# Patient Record
Sex: Male | Born: 1955
Health system: Southern US, Community
[De-identification: ages and names within clinical notes are randomized; demographics above are authoritative.]

## PROBLEM LIST (undated history)

## (undated) DIAGNOSIS — C439 Malignant melanoma of skin, unspecified: Secondary | ICD-10-CM

## (undated) DIAGNOSIS — S8992XA Unspecified injury of left lower leg, initial encounter: Secondary | ICD-10-CM

## (undated) DIAGNOSIS — I509 Heart failure, unspecified: Secondary | ICD-10-CM

## (undated) DIAGNOSIS — I10 Essential (primary) hypertension: Secondary | ICD-10-CM

## (undated) DIAGNOSIS — K219 Gastro-esophageal reflux disease without esophagitis: Secondary | ICD-10-CM

## (undated) HISTORY — DX: Malignant melanoma of skin, unspecified: C43.9

## (undated) HISTORY — PX: COLONOSCOPY: SHX174

## (undated) HISTORY — DX: Essential (primary) hypertension: I10

## (undated) HISTORY — DX: Gastro-esophageal reflux disease without esophagitis: K21.9

## (undated) HISTORY — PX: TOTAL HIP ARTHROPLASTY: SHX124

## (undated) HISTORY — PX: FOOT FRACTURE SURGERY: SHX645

## (undated) HISTORY — PX: KNEE SURGERY: SHX244

---

## 2012-05-25 ENCOUNTER — Emergency Department (HOSPITAL_BASED_OUTPATIENT_CLINIC_OR_DEPARTMENT_OTHER): Payer: BC Managed Care – PPO

## 2012-05-25 ENCOUNTER — Emergency Department (HOSPITAL_BASED_OUTPATIENT_CLINIC_OR_DEPARTMENT_OTHER)
Admission: EM | Admit: 2012-05-25 | Discharge: 2012-05-25 | Disposition: A | Discharge status: Home / Self Care | Payer: BC Managed Care – PPO | Attending: Emergency Medicine | Admitting: Emergency Medicine

## 2012-05-25 ENCOUNTER — Encounter (HOSPITAL_BASED_OUTPATIENT_CLINIC_OR_DEPARTMENT_OTHER): Payer: Self-pay

## 2012-05-25 DIAGNOSIS — Y9301 Activity, walking, marching and hiking: Secondary | ICD-10-CM | POA: Insufficient documentation

## 2012-05-25 DIAGNOSIS — Y9289 Other specified places as the place of occurrence of the external cause: Secondary | ICD-10-CM | POA: Insufficient documentation

## 2012-05-25 DIAGNOSIS — Z87828 Personal history of other (healed) physical injury and trauma: Secondary | ICD-10-CM | POA: Insufficient documentation

## 2012-05-25 DIAGNOSIS — S76111A Strain of right quadriceps muscle, fascia and tendon, initial encounter: Secondary | ICD-10-CM

## 2012-05-25 DIAGNOSIS — S838X9A Sprain of other specified parts of unspecified knee, initial encounter: Secondary | ICD-10-CM | POA: Insufficient documentation

## 2012-05-25 DIAGNOSIS — S86819A Strain of other muscle(s) and tendon(s) at lower leg level, unspecified leg, initial encounter: Secondary | ICD-10-CM | POA: Insufficient documentation

## 2012-05-25 DIAGNOSIS — X500XXA Overexertion from strenuous movement or load, initial encounter: Secondary | ICD-10-CM | POA: Insufficient documentation

## 2012-05-25 HISTORY — DX: Unspecified injury of left lower leg, initial encounter: S89.92XA

## 2012-05-25 MED ORDER — OXYCODONE-ACETAMINOPHEN 5-325 MG PO TABS: 2.0000 | ORAL_TABLET | ORAL | Status: DC | PRN

## 2012-05-25 NOTE — ED Provider Notes (Signed)
History     CSN: [id number]  Arrival date & time 05/25/12  1753   First MD Initiated Contact with Patient 05/25/12 1937      Chief Complaint  Patient presents with  . Knee Injury    (Consider location/radiation/quality/duration/timing/severity/associated sxs/prior treatment) Patient is a 57 y.o. male presenting with knee pain. The history is provided by the patient. No language interpreter was used.  Knee Pain Location:  Knee Time since incident:  3 hours Injury: yes   Mechanism of injury comment:  Walking down stairs and hyperextended Knee location:  R knee Pain details:    Quality:  Aching   Radiates to:  Does not radiate   Timing:  Constant   Progression:  Worsening Chronicity:  New Prior injury to area:  No Worsened by:  Nothing tried Ineffective treatments:  None tried  Pt reports he hyperextended his knee.  Pt complains of swelling and pain.  Past Medical History  Diagnosis Date  . Left knee injury     Past Surgical History  Procedure Laterality Date  . Total hip arthroplasty Right   . Foot fracture surgery Right     History reviewed. No pertinent family history.  History  Substance Use Topics  . Smoking status: Never Smoker   . Smokeless tobacco: Never Used  . Alcohol Use: No      Review of Systems  Musculoskeletal: Positive for myalgias and joint swelling.  All other systems reviewed and are negative.    Allergies  Morphine and related  Home Medications   Current Outpatient Rx  Name  Route  Sig  Dispense  Refill  . acetaminophen (TYLENOL) 500 MG tablet   Oral   Take 1,000 mg by mouth every 6 (six) hours as needed for pain.           BP 134/77  Pulse 87  Temp(Src) 98.4 F (36.9 C) (Oral)  Resp 20  Ht 5\' 9"  (1.753 m)  Wt 203 lb (92.08 kg)  BMI 29.96 kg/m2  SpO2 100%  Physical Exam  Nursing note and vitals reviewed. Constitutional: He is oriented to person, place, and time. He appears well-developed and well-nourished.   HENT:  Head: Normocephalic and atraumatic.  Musculoskeletal: He exhibits tenderness.  Swelling to right lower thigh, upper knee area,  Defect in quadracep muscle  Neurological: He is alert and oriented to person, place, and time.  Skin: Skin is warm.  Psychiatric: He has a normal mood and affect.    ED Course  Procedures (including critical care time)  Labs Reviewed - No data to display Dg Knee Complete 4 Views Right  05/25/2012  *RADIOLOGY REPORT*  Clinical Data: Hyperextension injury to the right knee while walking down stairs.  Suprapatellar pain and anterior swelling.  RIGHT KNEE - COMPLETE 4+ VIEW  Comparison: None.  Findings: Prepatellar soft tissue swelling.  No evidence of acute fracture or dislocation.  Severe patellofemoral compartment joint space narrowing and associated hypertrophic spurring.  Medial and lateral compartment joint spaces well preserved.  Calcification in the quadriceps tendon distally at its insertion on the superior patella.  Bone mineral density well preserved for age.  Possible small joint effusion.  IMPRESSION: No acute osseous abnormality.  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   Original Report Authenticated By: Hulan Saas, M.D.      1. Rupture, tendon, quadriceps, right, initial encounter       MDM  Pt sees an Orthopaedist in [place].   Pt advised he needs  to follow up with orthopaedist for evaluation.   Ice to area of swelling.   Rx for percocet      Elson Areas, PA-C 05/25/12 1955  Lonia Skinner [place], PA-C 05/25/12 2002

## 2012-05-25 NOTE — ED Notes (Signed)
I applied knee immobilizer.

## 2012-05-25 NOTE — ED Notes (Signed)
Pt states that he was ambulating down the stairs and hyperexteneded his R knee.  Pt presents with swelling to the anterior knee.  No previous injury / trauma to the knee.

## 2012-05-25 NOTE — ED Provider Notes (Signed)
Medical screening examination/treatment/procedure(s) were performed by non-physician practitioner and as supervising physician I was immediately available for consultation/collaboration.   Rolan Bucco, MD 05/25/12 [phone]

## 2014-03-09 IMAGING — CR DG KNEE COMPLETE 4+V*R*
4 series · 4 of 4 positions shown · non-contrast
Comparison: None.

CLINICAL DATA: Hyperextension injury to the right knee while
walking down stairs.  Suprapatellar pain and anterior swelling.

RIGHT KNEE - COMPLETE 4+ VIEW

[t knee ap right]
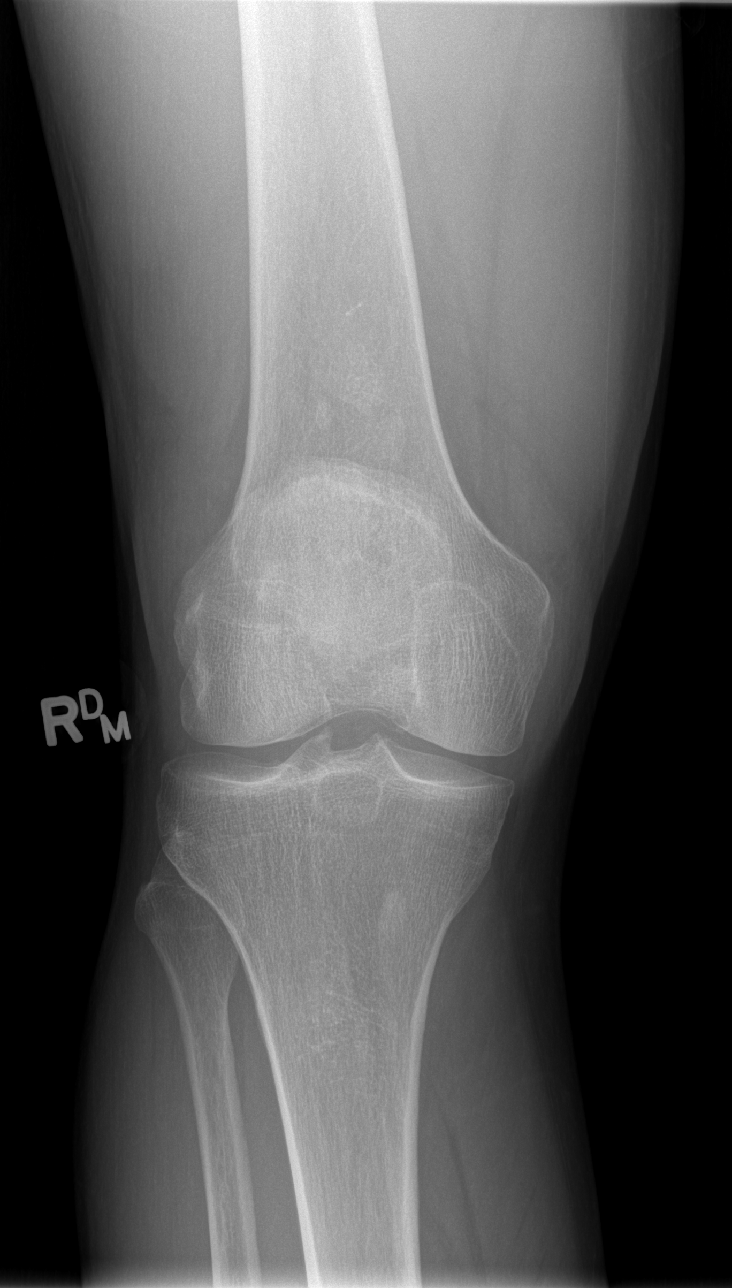

[t knee oblique right (1 of 2)]
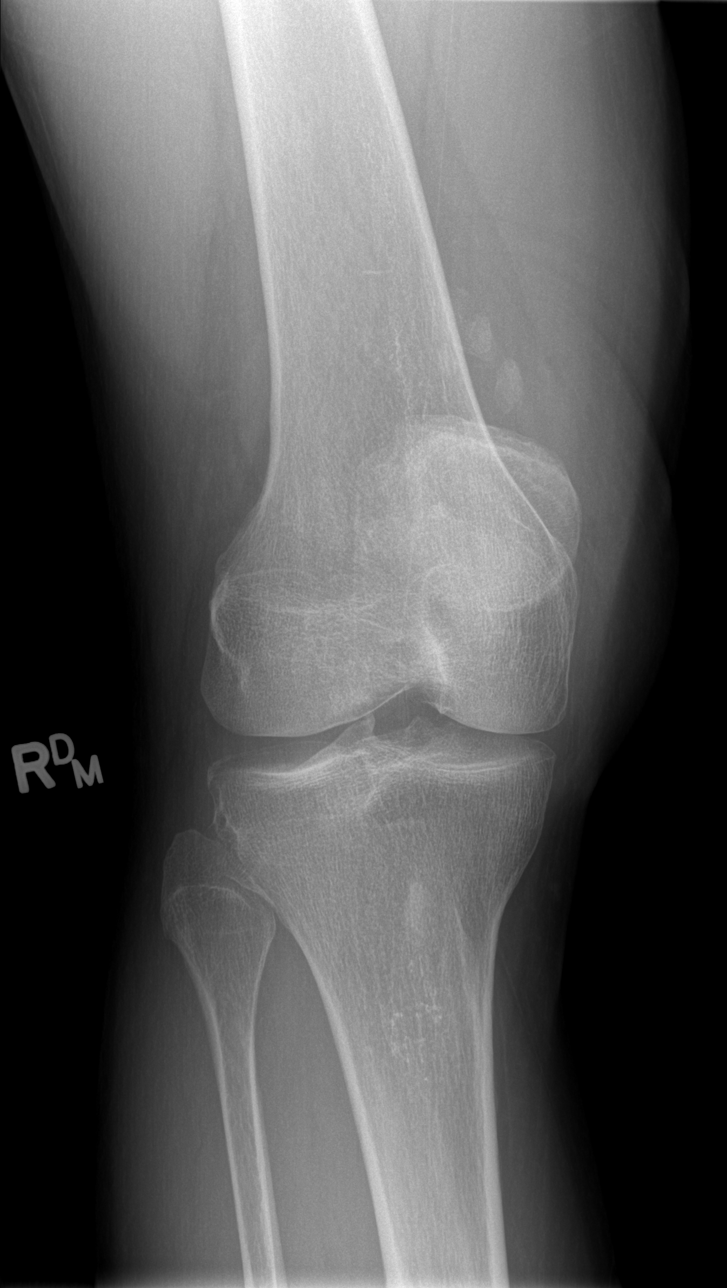

[t knee oblique right (2 of 2)]
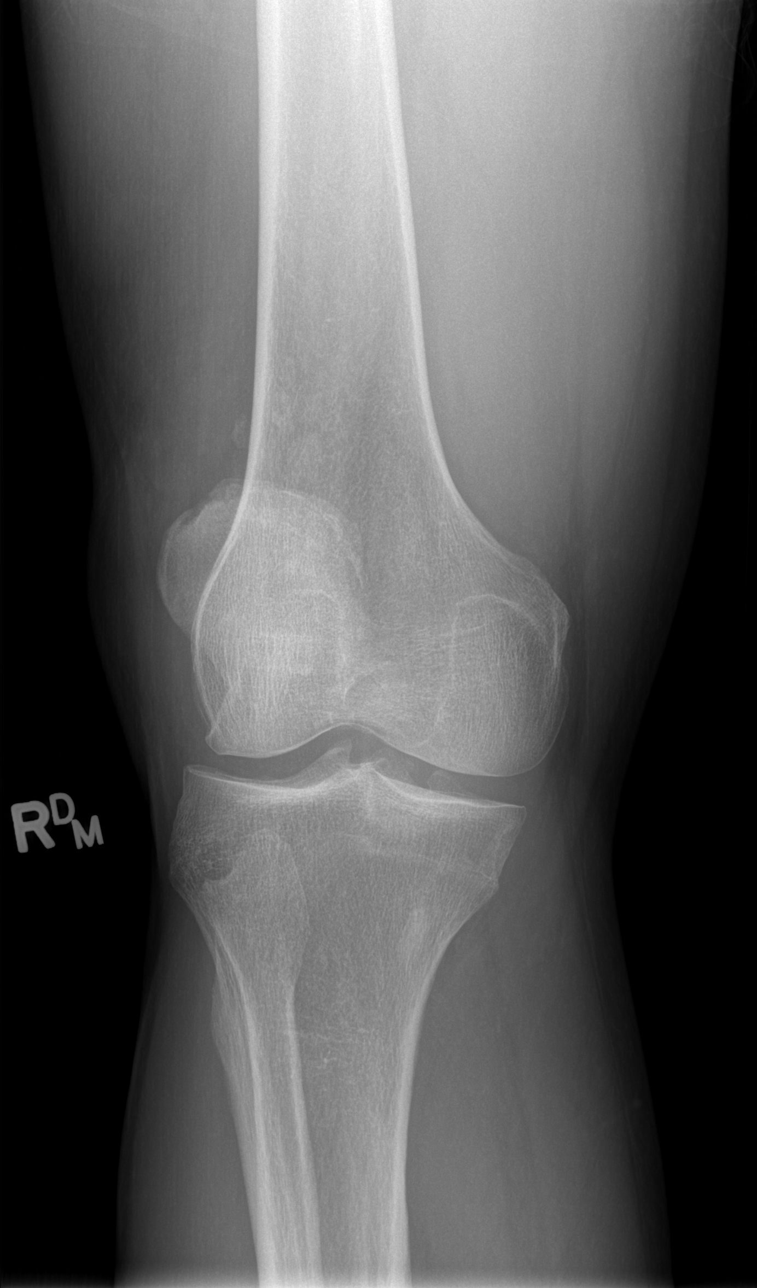

[t knee lat right]
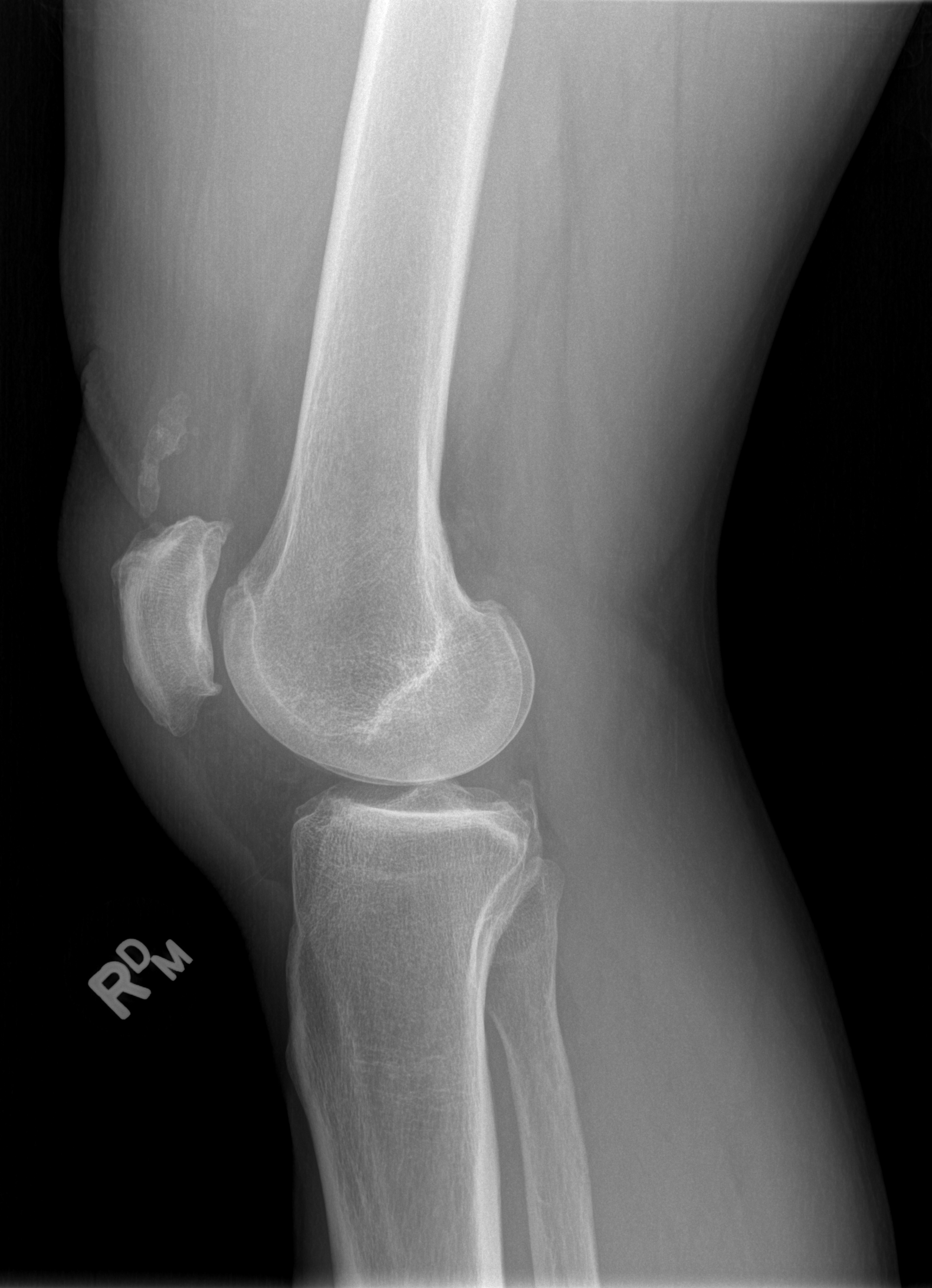

[4 of 4 positions shown; findings below may reference images not displayed]

FINDINGS: Prepatellar soft tissue swelling.  No evidence of acute
fracture or dislocation.  Severe patellofemoral compartment joint
space narrowing and associated hypertrophic spurring.  Medial and
lateral compartment joint spaces well preserved.  Calcification in
the quadriceps tendon distally at its insertion on the superior
patella.  Bone mineral density well preserved for age.  Possible
small joint effusion.
IMPRESSION: No acute osseous abnormality.  Severe degenerative changes in the
patellofemoral compartment.

## 2020-01-13 DIAGNOSIS — M7022 Olecranon bursitis, left elbow: Secondary | ICD-10-CM | POA: Insufficient documentation

## 2020-02-08 ENCOUNTER — Emergency Department (HOSPITAL_BASED_OUTPATIENT_CLINIC_OR_DEPARTMENT_OTHER)
Admission: EM | Admit: 2020-02-08 | Discharge: 2020-02-08 | Disposition: A | Discharge status: Home / Self Care | Payer: PRIVATE HEALTH INSURANCE | Attending: Emergency Medicine | Admitting: Emergency Medicine

## 2020-02-08 ENCOUNTER — Encounter (HOSPITAL_BASED_OUTPATIENT_CLINIC_OR_DEPARTMENT_OTHER): Payer: Self-pay | Admitting: Emergency Medicine

## 2020-02-08 ENCOUNTER — Other Ambulatory Visit: Payer: Self-pay

## 2020-02-08 DIAGNOSIS — R5383 Other fatigue: Secondary | ICD-10-CM | POA: Diagnosis not present

## 2020-02-08 DIAGNOSIS — R1013 Epigastric pain: Secondary | ICD-10-CM | POA: Insufficient documentation

## 2020-02-08 DIAGNOSIS — R197 Diarrhea, unspecified: Secondary | ICD-10-CM | POA: Insufficient documentation

## 2020-02-08 DIAGNOSIS — Z20822 Contact with and (suspected) exposure to covid-19: Secondary | ICD-10-CM | POA: Insufficient documentation

## 2020-02-08 DIAGNOSIS — R140 Abdominal distension (gaseous): Secondary | ICD-10-CM | POA: Insufficient documentation

## 2020-02-08 DIAGNOSIS — R509 Fever, unspecified: Secondary | ICD-10-CM | POA: Diagnosis not present

## 2020-02-08 DIAGNOSIS — R059 Cough, unspecified: Secondary | ICD-10-CM | POA: Insufficient documentation

## 2020-02-08 DIAGNOSIS — R112 Nausea with vomiting, unspecified: Secondary | ICD-10-CM | POA: Insufficient documentation

## 2020-02-08 LAB — COMPREHENSIVE METABOLIC PANEL
ALT: 46 U/L — ABNORMAL HIGH (ref 0–44)
AST: 53 U/L — ABNORMAL HIGH (ref 15–41)
Albumin: 4.3 g/dL (ref 3.5–5.0)
Alkaline Phosphatase: 46 U/L (ref 38–126)
Anion gap: 9 (ref 5–15)
BUN: 14 mg/dL (ref 8–23)
CO2: 26 mmol/L (ref 22–32)
Calcium: 9.3 mg/dL (ref 8.9–10.3)
Chloride: 99 mmol/L (ref 98–111)
Creatinine, Ser: 1.25 mg/dL — ABNORMAL HIGH (ref 0.61–1.24)
GFR, Estimated: >60 mL/min (ref >60)
Glucose, Bld: 125 mg/dL — ABNORMAL HIGH (ref 70–99)
Potassium: 3.8 mmol/L (ref 3.5–5.1)
Sodium: 134 mmol/L — ABNORMAL LOW (ref 135–145)
Total Bilirubin: 1.3 mg/dL — ABNORMAL HIGH (ref 0.3–1.2)
Total Protein: 8 g/dL (ref 6.5–8.1)

## 2020-02-08 LAB — CBC WITH DIFFERENTIAL/PLATELET
Abs Immature Granulocytes: 0.04 10*3/uL (ref 0.00–0.07)
Basophils Absolute: 0 10*3/uL (ref 0.0–0.1)
Basophils Relative: 0 %
Eosinophils Absolute: 0 10*3/uL (ref 0.0–0.5)
Eosinophils Relative: 0 %
HCT: 51.9 % (ref 39.0–52.0)
Hemoglobin: 17.6 g/dL — ABNORMAL HIGH (ref 13.0–17.0)
Immature Granulocytes: 0 %
Lymphocytes Relative: 3 %
Lymphs Abs: 0.3 10*3/uL — ABNORMAL LOW (ref 0.7–4.0)
MCH: 30.9 pg (ref 26.0–34.0)
MCHC: 33.9 g/dL (ref 30.0–36.0)
MCV: 91.2 fL (ref 80.0–100.0)
Monocytes Absolute: 0.6 10*3/uL (ref 0.1–1.0)
Monocytes Relative: 5 %
Neutro Abs: 10.5 10*3/uL — ABNORMAL HIGH (ref 1.7–7.7)
Neutrophils Relative %: 92 %
Platelets: 329 10*3/uL (ref 150–400)
RBC: 5.69 MIL/uL (ref 4.22–5.81)
RDW: 14.7 % (ref 11.5–15.5)
WBC: 11.5 10*3/uL — ABNORMAL HIGH (ref 4.0–10.5)
nRBC: 0 % (ref 0.0–0.2)

## 2020-02-08 LAB — RESP PANEL BY RT-PCR (FLU A&B, COVID) ARPGX2
Influenza A by PCR: NEGATIVE
Influenza B by PCR: NEGATIVE
SARS Coronavirus 2 by RT PCR: NEGATIVE

## 2020-02-08 LAB — LIPASE, BLOOD: Lipase: 40 U/L (ref 11–51)

## 2020-02-08 MED ORDER — HYDROMORPHONE HCL 1 MG/ML IJ SOLN
0.5000 mg | Freq: Once | INTRAMUSCULAR | Status: AC
  Administered 2020-02-08: 0.5 mg via INTRAVENOUS
  Filled 2020-02-08: qty 1

## 2020-02-08 MED ORDER — ONDANSETRON 4 MG PO TBDP
4.0000 mg | ORAL_TABLET | Freq: Once | ORAL | Status: AC
  Administered 2020-02-08: 4 mg via ORAL
  Filled 2020-02-08: qty 1

## 2020-02-08 MED ORDER — ALUM & MAG HYDROXIDE-SIMETH 200-200-20 MG/5ML PO SUSP
30.0000 mL | Freq: Once | ORAL | Status: AC
  Administered 2020-02-08: 30 mL via ORAL
  Filled 2020-02-08: qty 30

## 2020-02-08 MED ORDER — SODIUM CHLORIDE 0.9 % IV BOLUS
1000.0000 mL | Freq: Once | INTRAVENOUS | Status: AC
  Administered 2020-02-08: 1000 mL via INTRAVENOUS

## 2020-02-08 MED ORDER — ONDANSETRON 4 MG PO TBDP: ORAL_TABLET | ORAL | 0 refills | Status: DC

## 2020-02-08 NOTE — ED Notes (Signed)
Pt informed not to eat or drink until further notice, sr x 2 up, cont POX monitor on, call bell within reach

## 2020-02-08 NOTE — ED Notes (Signed)
Presents with N/V/D since this am, having HA and fatigue as well, Unable to keep POs down at all per pt statement.

## 2020-02-08 NOTE — ED Notes (Signed)
ED Provider at bedside. 

## 2020-02-08 NOTE — ED Triage Notes (Signed)
Reports n/v/d with headache since this morning.  Also endorses fatigue.

## 2020-02-08 NOTE — Discharge Instructions (Signed)
Can try imodium for diarrhea

## 2020-02-08 NOTE — ED Provider Notes (Signed)
[facility] EMERGENCY DEPARTMENT Provider Note   CSN: [id number] Arrival date & time: 02/08/20  1757     History Chief Complaint  Patient presents with  . Vomiting    [facility] is a 64 y.o. male.  64 yo M with a chief complaint of nausea vomiting and diarrhea.  Started this morning.  Not been able to eat or drink anything.  Has felt hot and cold off and on but no measured temperature.  Having some epigastric abdominal pain.  The history is provided by the patient.  Illness Severity:  Moderate Onset quality:  Gradual Duration:  2 days Timing:  Constant Progression:  Worsening Chronicity:  New Associated symptoms: cough (a little), diarrhea, fatigue, fever, nausea and vomiting   Associated symptoms: no abdominal pain, no chest pain, no congestion, no headaches, no myalgias, no rash and no shortness of breath        Past Medical History:  Diagnosis Date  . Left knee injury     There are no problems to display for this patient.   Past Surgical History:  Procedure Laterality Date  . FOOT FRACTURE SURGERY Right   . TOTAL HIP ARTHROPLASTY Right        No family history on file.  Social History   Tobacco Use  . Smoking status: Never Smoker  . Smokeless tobacco: Never Used  Substance Use Topics  . Alcohol use: No  . Drug use: No    Home Medications Prior to Admission medications   Medication Sig Start Date End Date Taking? Authorizing Provider  acetaminophen (TYLENOL) 500 MG tablet Take 1,000 mg by mouth every 6 (six) hours as needed for pain.    [provider]  ondansetron (ZOFRAN ODT) 4 MG disintegrating tablet 4mg  ODT q4 hours prn nausea/vomit 02/08/20   02/10/20, DO  oxyCODONE-acetaminophen (PERCOCET/ROXICET) 5-325 MG per tablet Take 2 tablets by mouth every 4 (four) hours as needed for pain. 05/25/12   07/25/12, PA-C    Allergies    Morphine and related  Review of Systems   Review of Systems  Constitutional:  Positive for chills, fatigue and fever.  HENT: Negative for congestion and facial swelling.   Eyes: Negative for discharge and visual disturbance.  Respiratory: Positive for cough (a little). Negative for shortness of breath.   Cardiovascular: Negative for chest pain and palpitations.  Gastrointestinal: Positive for diarrhea, nausea and vomiting. Negative for abdominal pain.  Musculoskeletal: Negative for arthralgias and myalgias.  Skin: Negative for color change and rash.  Neurological: Negative for tremors, syncope and headaches.  Psychiatric/Behavioral: Negative for confusion and dysphoric mood.    Physical Exam Updated Vital Signs BP (!) 138/96 (BP Location: Right Arm)   Pulse 95   Temp 99.5 F (37.5 C) (Oral)   Resp 18   Ht 5\' 9"  (1.753 m)   Wt 90.7 kg   SpO2 98%   BMI 29.53 kg/m   Physical Exam Vitals and nursing note reviewed.  Constitutional:      Appearance: He is well-developed and well-nourished.  HENT:     Head: Normocephalic and atraumatic.  Eyes:     Extraocular Movements: EOM normal.     Pupils: Pupils are equal, round, and reactive to light.  Neck:     Vascular: No JVD.  Cardiovascular:     Rate and Rhythm: Normal rate and regular rhythm.     Heart sounds: No murmur heard. No friction rub. No gallop.   Pulmonary:  Effort: No respiratory distress.     Breath sounds: No wheezing.  Abdominal:     General: There is distension (slight).     Tenderness: There is abdominal tenderness (mild diffuse). There is no guarding or rebound.  Musculoskeletal:        General: Normal range of motion.     Cervical back: Normal range of motion and neck supple.  Skin:    Coloration: Skin is not pale.     Findings: No rash.  Neurological:     Mental Status: He is alert and oriented to person, place, and time.  Psychiatric:        Mood and Affect: Mood and affect normal.        Behavior: Behavior normal.     ED Results / Procedures / Treatments   Labs (all  labs ordered are listed, but only abnormal results are displayed) Labs Reviewed  CBC WITH DIFFERENTIAL/PLATELET - Abnormal; Notable for the following components:      Result Value   WBC 11.5 (*)    Hemoglobin 17.6 (*)    Neutro Abs 10.5 (*)    Lymphs Abs 0.3 (*)    All other components within normal limits  COMPREHENSIVE METABOLIC PANEL - Abnormal; Notable for the following components:   Sodium 134 (*)    Glucose, Bld 125 (*)    Creatinine, Ser 1.25 (*)    AST 53 (*)    ALT 46 (*)    Total Bilirubin 1.3 (*)    All other components within normal limits  RESP PANEL BY RT-PCR (FLU A&B, COVID) ARPGX2  LIPASE, BLOOD    EKG None  Radiology No results found.  Procedures Procedures (including critical care time)  Medications Ordered in ED Medications  sodium chloride 0.9 % bolus 1,000 mL (0 mLs Intravenous Stopped 02/08/20 2041)  ondansetron (ZOFRAN-ODT) disintegrating tablet 4 mg (4 mg Oral Given 02/08/20 1945)  HYDROmorphone (DILAUDID) injection 0.5 mg (0.5 mg Intravenous Given 02/08/20 1949)  alum & mag hydroxide-simeth (MAALOX/MYLANTA) 200-200-20 MG/5ML suspension 30 mL (30 mLs Oral Given 02/08/20 2042)    ED Course  I have reviewed the triage vital signs and the nursing notes.  Pertinent labs & imaging results that were available during my care of the patient were reviewed by me and considered in my medical decision making (see chart for details).    MDM Rules/Calculators/A&P                          64 yo M with a chief complaints of nausea vomiting and diarrhea.  Has mild epigastric pain on exam.  Mostly diffuse though.  Will check lab work.  Treat pain and nausea.  Reassess.  Patient feeling much better on reassessment.  Tolerating p.o.  Will discharge patient home.  Covid test negative.  LFTs and lipase unremarkable.  PCP follow-up.  [name] was evaluated in Emergency Department on 02/08/2020 for the symptoms described in the history of present illness.  He/she was evaluated in the context of the global COVID-19 pandemic, which necessitated consideration that the patient might be at risk for infection with the SARS-CoV-2 virus that causes COVID-19. Institutional protocols and algorithms that pertain to the evaluation of patients at risk for COVID-19 are in a state of rapid change based on information released by regulatory bodies including the CDC and federal and state organizations. These policies and algorithms were followed during the patient's care in the ED.   8:59 PM:  I have discussed the diagnosis/risks/treatment options with the patient and believe the pt to be eligible for discharge home to follow-up with PCP. We also discussed returning to the ED immediately if new or worsening sx occur. We discussed the sx which are most concerning (e.g., sudden worsening pain, fever, inability to tolerate by mouth) that necessitate immediate return. Medications administered to the patient during their visit and any new prescriptions provided to the patient are listed below.  Medications given during this visit Medications  sodium chloride 0.9 % bolus 1,000 mL (0 mLs Intravenous Stopped 02/08/20 2041)  ondansetron (ZOFRAN-ODT) disintegrating tablet 4 mg (4 mg Oral Given 02/08/20 1945)  HYDROmorphone (DILAUDID) injection 0.5 mg (0.5 mg Intravenous Given 02/08/20 1949)  alum & mag hydroxide-simeth (MAALOX/MYLANTA) 200-200-20 MG/5ML suspension 30 mL (30 mLs Oral Given 02/08/20 2042)     The patient appears reasonably screen and/or stabilized for discharge and I doubt any other medical condition or other [facility] requiring further screening, evaluation, or treatment in the ED at this time prior to discharge.   Final Clinical Impression(s) / ED Diagnoses Final diagnoses:  Nausea vomiting and diarrhea    Rx / DC Orders ED Discharge Orders         Ordered    ondansetron (ZOFRAN ODT) 4 MG disintegrating tablet        02/08/20 2057           Melene Plan,  DO 02/08/20 2059

## 2020-04-02 ENCOUNTER — Emergency Department (HOSPITAL_BASED_OUTPATIENT_CLINIC_OR_DEPARTMENT_OTHER)
Admission: EM | Admit: 2020-04-02 | Discharge: 2020-04-02 | Disposition: A | Discharge status: Home / Self Care | Payer: PRIVATE HEALTH INSURANCE | Attending: Emergency Medicine | Admitting: Emergency Medicine

## 2020-04-02 ENCOUNTER — Other Ambulatory Visit: Payer: Self-pay

## 2020-04-02 ENCOUNTER — Other Ambulatory Visit (HOSPITAL_COMMUNITY): Payer: Self-pay | Admitting: Physician Assistant

## 2020-04-02 ENCOUNTER — Encounter (HOSPITAL_BASED_OUTPATIENT_CLINIC_OR_DEPARTMENT_OTHER): Payer: Self-pay | Admitting: *Deleted

## 2020-04-02 DIAGNOSIS — H6123 Impacted cerumen, bilateral: Secondary | ICD-10-CM | POA: Insufficient documentation

## 2020-04-02 DIAGNOSIS — H9203 Otalgia, bilateral: Secondary | ICD-10-CM | POA: Diagnosis present

## 2020-04-02 MED ORDER — OFLOXACIN 0.3 % OT SOLN: 10.0000 [drp] | Freq: Every day | OTIC | 0 refills | Status: AC

## 2020-04-02 MED FILL — OFLOXACIN 0.3 % SOLN: 0.3 | 10 days supply | Qty: 5 | Fill #0

## 2020-04-02 NOTE — ED Triage Notes (Signed)
Hx hearing problems. He flushed his ears last night at home. He woke with fullness in both ears.

## 2020-04-02 NOTE — Discharge Instructions (Addendum)
Use the antibiotic eardrops on your left ear.  On exam today it appeared that you had a possible rupture of your eardrum.  Because of this you were referred to the ear nose and throat doctor.  Please call the office to schedule an appointment for follow-up within the next week.  Please return to the emergency department for any new or worsening symptoms in the meantime.

## 2020-04-02 NOTE — ED Provider Notes (Addendum)
[facility] Provider Note   CSN: [id number] Arrival date & time: 04/02/20  1124     History Chief Complaint  Patient presents with  . Ear Fullness    Travis Shea is a 65 y.o. male.  HPI   65 year old male who presents to the emergency [facility] today for evaluation of bilateral ear fullness.  States he woke up this morning and felt like his bilateral ears were stuffed up and he had decreased hearing.  He tried an at home earwax removal kit which he states was like a screw that you stick in your ear to remove the wax.  He denies any severe pain or bleeding following this procedure but states that his symptoms remain the same. Denies other associated symptoms.  Past Medical History:  Diagnosis Date  . Left knee injury     There are no problems to display for this patient.   Past Surgical History:  Procedure Laterality Date  . FOOT FRACTURE SURGERY Right   . TOTAL HIP ARTHROPLASTY Right        No family history on file.  Social History   Tobacco Use  . Smoking status: Never Smoker  . Smokeless tobacco: Never Used  Substance Use Topics  . Alcohol use: No  . Drug use: No    Home Medications Prior to Admission medications   Medication Sig Start Date End Date Taking? Authorizing Provider  ofloxacin (FLOXIN) 0.3 % OTIC solution Place 10 drops into the left ear daily for 7 days. 04/02/20 04/09/20 Yes Raedyn Wenke S, PA-C  acetaminophen (TYLENOL) 500 MG tablet Take 1,000 mg by mouth every 6 (six) hours as needed for pain.    [provider]  ondansetron (ZOFRAN ODT) 4 MG disintegrating tablet 94m ODT q4 hours prn nausea/vomit 02/08/20   FDeno Etienne DO  oxyCODONE-acetaminophen (PERCOCET/ROXICET) 5-325 MG per tablet Take 2 tablets by mouth every 4 (four) hours as needed for pain. 05/25/12   SFransico Meadow PA-C    Allergies    Morphine and related  Review of Systems   Review of Systems  Constitutional: Negative for fever.   HENT: Negative for congestion and rhinorrhea.        Ear fullness and decreased hearing  Respiratory: Negative for cough.   Neurological: Negative for headaches.    Physical Exam Updated Vital Signs BP (!) 148/93 (BP Location: Left Arm)   Pulse 85   Temp 97.8 F (36.6 C) (Oral)   Resp 20   Ht 5' 9" (1.753 m)   Wt 90.7 kg   SpO2 96%   BMI 29.53 kg/m   Physical Exam Constitutional:      General: He is not in acute distress.    Appearance: He is well-developed and well-nourished.  HENT:     Ears:     Comments: Bilateral TMs obstructed due to cerumen impaction.  External auditory canal on the left has abrasions and dried blood noted Eyes:     Conjunctiva/sclera: Conjunctivae normal.  Cardiovascular:     Rate and Rhythm: Normal rate.  Pulmonary:     Effort: Pulmonary effort is normal.  Skin:    General: Skin is warm and dry.  Neurological:     Mental Status: He is alert and oriented to person, place, and time.     ED Results / Procedures / Treatments   Labs (all labs ordered are listed, but only abnormal results are displayed) Labs Reviewed - No data to display  EKG None  Radiology No results found.  Procedures .Ear Cerumen Removal  Date/Time: 04/02/2020 12:02 PM Performed by: Rodney Booze, PA-C Authorized by: Rodney Booze, PA-C   Consent:    Consent obtained:  Verbal   Consent given by:  Patient   Risks, benefits, and alternatives were discussed: yes     Risks discussed:  Bleeding, infection, incomplete removal and pain   Alternatives discussed:  No treatment Universal protocol:    Procedure explained and questions answered to patient or proxy's satisfaction: yes     Site/side marked: yes     Patient identity confirmed:  Verbally with patient Procedure details:    Location:  L ear and R ear   Procedure type: curette     Procedure outcomes: cerumen removed   Post-procedure details:    Post-procedure ear inspection: abrasion and dried blood  noted in the left external canal prior to procedure. R TM intact following procedure. L TM incompletely visualized but possible rupture noted.    Hearing quality:  Improved   Procedure completion:  Tolerated     Medications Ordered in ED Medications - No data to display  ED Course  I have reviewed the triage vital signs and the nursing notes.  Pertinent labs & imaging results that were available during my care of the patient were reviewed by me and considered in my medical decision making (see chart for details).    MDM Rules/Calculators/A&P                          65 year old male presenting for evaluation of bilateral ear fullness.  On exam has bilateral cerumen impactions.  He tried an at home earwax removal kit.  On my exam he does have some abrasions on the external auditory canal that were there prior to [place] disimpaction which I suspect are due to his at home removal kit.  Bilateral cerumen impactions were performed here in the emergency [facility].  The right TM appeared normal and the left TM was difficult to fully visualize and has a possible perforation.  Given the abrasions to the external canal, will place patient on ofloxacin. Will have pt f/u with ENT. Advised on return precautions. He voices understanding of the plan and reasons to return. All questions answered pt stable for discharge.   Final Clinical Impression(s) / ED Diagnoses Final diagnoses:  Bilateral impacted cerumen    Rx / DC Orders ED Discharge Orders         Ordered    ofloxacin (FLOXIN) 0.3 % OTIC solution  Daily        04/02/20 [address], Shakiyah Cirilo S, PA-C 04/02/20 1201    Carlas Vandyne S, PA-C 04/02/20 1203    Dorie Rank, MD 04/04/20 [phone]

## 2021-08-03 ENCOUNTER — Encounter (HOSPITAL_COMMUNITY): Payer: Self-pay

## 2021-08-03 ENCOUNTER — Emergency Department (HOSPITAL_COMMUNITY)
Admission: EM | Admit: 2021-08-03 | Discharge: 2021-08-04 | Discharge status: Against Medical Advice | Payer: Medicare PPO | Attending: Emergency Medicine | Admitting: Emergency Medicine

## 2021-08-03 ENCOUNTER — Ambulatory Visit
Admission: EM | Admit: 2021-08-03 | Discharge: 2021-08-03 | Disposition: A | Discharge status: Home / Self Care | Payer: Medicare PPO

## 2021-08-03 DIAGNOSIS — R112 Nausea with vomiting, unspecified: Secondary | ICD-10-CM | POA: Diagnosis present

## 2021-08-03 DIAGNOSIS — Z5321 Procedure and treatment not carried out due to patient leaving prior to being seen by health care provider: Secondary | ICD-10-CM | POA: Insufficient documentation

## 2021-08-03 DIAGNOSIS — R109 Unspecified abdominal pain: Secondary | ICD-10-CM | POA: Insufficient documentation

## 2021-08-03 DIAGNOSIS — R197 Diarrhea, unspecified: Secondary | ICD-10-CM | POA: Insufficient documentation

## 2021-08-03 DIAGNOSIS — R509 Fever, unspecified: Secondary | ICD-10-CM | POA: Diagnosis not present

## 2021-08-03 LAB — CBC WITH DIFFERENTIAL/PLATELET
Abs Immature Granulocytes: 0.05 10*3/uL (ref 0.00–0.07)
Basophils Absolute: 0 10*3/uL (ref 0.0–0.1)
Basophils Relative: 0 %
Eosinophils Absolute: 0 10*3/uL (ref 0.0–0.5)
Eosinophils Relative: 0 %
HCT: 52.3 % — ABNORMAL HIGH (ref 39.0–52.0)
Hemoglobin: 18 g/dL — ABNORMAL HIGH (ref 13.0–17.0)
Immature Granulocytes: 1 %
Lymphocytes Relative: 11 %
Lymphs Abs: 1.2 10*3/uL (ref 0.7–4.0)
MCH: 31.4 pg (ref 26.0–34.0)
MCHC: 34.4 g/dL (ref 30.0–36.0)
MCV: 91.3 fL (ref 80.0–100.0)
Monocytes Absolute: 1 10*3/uL (ref 0.1–1.0)
Monocytes Relative: 9 %
Neutro Abs: 8.7 10*3/uL — ABNORMAL HIGH (ref 1.7–7.7)
Neutrophils Relative %: 79 %
Platelets: 284 10*3/uL (ref 150–400)
RBC: 5.73 MIL/uL (ref 4.22–5.81)
RDW: 14 % (ref 11.5–15.5)
WBC: 11 10*3/uL — ABNORMAL HIGH (ref 4.0–10.5)
nRBC: 0 % (ref 0.0–0.2)

## 2021-08-03 LAB — URINALYSIS, ROUTINE W REFLEX MICROSCOPIC
Bilirubin Urine: NEGATIVE
Glucose, UA: NEGATIVE mg/dL
Ketones, ur: 5 mg/dL — AB
Leukocytes,Ua: NEGATIVE
Nitrite: NEGATIVE
Protein, ur: NEGATIVE mg/dL
Specific Gravity, Urine: 1.026 (ref 1.005–1.030)
pH: 5 (ref 5.0–8.0)

## 2021-08-03 LAB — COMPREHENSIVE METABOLIC PANEL
ALT: 47 U/L — ABNORMAL HIGH (ref 0–44)
AST: 40 U/L (ref 15–41)
Albumin: 3.9 g/dL (ref 3.5–5.0)
Alkaline Phosphatase: 55 U/L (ref 38–126)
Anion gap: 10 (ref 5–15)
BUN: 14 mg/dL (ref 8–23)
CO2: 22 mmol/L (ref 22–32)
Calcium: 9.9 mg/dL (ref 8.9–10.3)
Chloride: 104 mmol/L (ref 98–111)
Creatinine, Ser: 1.47 mg/dL — ABNORMAL HIGH (ref 0.61–1.24)
GFR, Estimated: 52 mL/min — ABNORMAL LOW (ref >60)
Glucose, Bld: 117 mg/dL — ABNORMAL HIGH (ref 70–99)
Potassium: 4.1 mmol/L (ref 3.5–5.1)
Sodium: 136 mmol/L (ref 135–145)
Total Bilirubin: 0.8 mg/dL (ref 0.3–1.2)
Total Protein: 7.5 g/dL (ref 6.5–8.1)

## 2021-08-03 LAB — LIPASE, BLOOD: Lipase: 42 U/L (ref 11–51)

## 2021-08-03 MED ORDER — ONDANSETRON 4 MG PO TBDP
4.0000 mg | ORAL_TABLET | Freq: Once | ORAL | Status: AC
  Administered 2021-08-03: 4 mg via ORAL

## 2021-08-03 MED ORDER — ONDANSETRON 4 MG PO TBDP
ORAL_TABLET | ORAL | Status: AC
  Filled 2021-08-03: qty 1

## 2021-08-03 NOTE — ED Triage Notes (Signed)
Pt reports generalized abd pain with n/v/d and fevers for the past 2 days

## 2021-08-03 NOTE — ED Provider Triage Note (Signed)
Emergency Medicine Provider Triage Evaluation Note  Travis Shea , a 66 y.o. male  was evaluated in triage.  Pt complains of N/V/D x2 days.  No sick contacts with similar.   No recent travel, changes in diet, abnormal food intake.  Has felt febrile, but no fever in triage.  Has tried pedialyte but cannot hold it down.  Review of Systems  Positive: N/V/D Negative: SOB, chest pain  Physical Exam  BP (!) 140/92   Pulse 90   Temp 98.8 F (37.1 C) (Oral)   Resp 18   SpO2 97%  Gen:   Awake, no distress   Resp:  Normal effort  MSK:   Moves extremities without difficulty  Other:  Abdomen soft, reports generalized pain but no focal tenderness on exam  Medical Decision Making  Medically screening exam initiated at 10:39 PM.  Appropriate orders placed.  [facility] was informed that the remainder of the evaluation will be completed by another provider, this initial triage assessment does not replace that evaluation, and the importance of remaining in the ED until their evaluation is complete.  N/V/D.  No focal tenderness on abdominal exam.  VSS.  Will check labs, given zofran for nausea.   Garlon Hatchet, PA-C 08/03/21 2240

## 2021-08-03 NOTE — ED Notes (Addendum)
Patient came out of treatment room demanding to know when he was going to be seen.  Rolly Salter, NP was at desk and was trying to explain the flow and apologize for inconvenience.  This nurse approached desk.  Tried to explain this is urgent care and available resources.  Patient questioned why was center called urgent care and not medical center .  Patient did make derogatory comments in regards to other patients being seen.  Patient then left clinical area approached patient access demanding money and wife and patient left the building

## 2021-08-03 NOTE — ED Triage Notes (Signed)
Patient came out of bathroom requesting somewhere to lie down.  Reports feeling like he will pass out.  Positioned exam table so patient could lie down.  Family member at bedside.  Reports he has been vomiting and diarrhea for 2 days.  Skin warm and dry

## 2021-08-04 NOTE — ED Notes (Signed)
Pt named call 3x for updated vitals, no response

## 2021-12-16 ENCOUNTER — Emergency Department (HOSPITAL_BASED_OUTPATIENT_CLINIC_OR_DEPARTMENT_OTHER)
Admission: EM | Admit: 2021-12-16 | Discharge: 2021-12-16 | Disposition: A | Discharge status: Home / Self Care | Payer: Medicare PPO | Attending: Emergency Medicine | Admitting: Emergency Medicine

## 2021-12-16 ENCOUNTER — Other Ambulatory Visit: Payer: Self-pay

## 2021-12-16 ENCOUNTER — Encounter (HOSPITAL_BASED_OUTPATIENT_CLINIC_OR_DEPARTMENT_OTHER): Payer: Self-pay | Admitting: Urology

## 2021-12-16 ENCOUNTER — Other Ambulatory Visit (HOSPITAL_BASED_OUTPATIENT_CLINIC_OR_DEPARTMENT_OTHER): Payer: Self-pay

## 2021-12-16 DIAGNOSIS — L0231 Cutaneous abscess of buttock: Secondary | ICD-10-CM | POA: Diagnosis present

## 2021-12-16 MED ORDER — DOXYCYCLINE HYCLATE 100 MG PO TABS
100.0000 mg | ORAL_TABLET | Freq: Once | ORAL | Status: AC
  Administered 2021-12-16: 100 mg via ORAL
  Filled 2021-12-16: qty 1

## 2021-12-16 MED ORDER — LIDOCAINE HCL (PF) 1 % IJ SOLN
5.0000 mL | Freq: Once | INTRAMUSCULAR | Status: AC
  Administered 2021-12-16: 5 mL
  Filled 2021-12-16: qty 5

## 2021-12-16 MED ORDER — DOXYCYCLINE HYCLATE 100 MG PO CAPS
100.0000 mg | ORAL_CAPSULE | Freq: Two times a day (BID) | ORAL | 0 refills | Status: AC
  Filled 2021-12-16: qty 14, 7d supply, fill #0

## 2021-12-16 MED ORDER — IBUPROFEN 400 MG PO TABS
600.0000 mg | ORAL_TABLET | Freq: Once | ORAL | Status: AC
  Administered 2021-12-16: 600 mg via ORAL
  Filled 2021-12-16: qty 1

## 2021-12-16 MED ORDER — ACETAMINOPHEN 325 MG PO TABS
650.0000 mg | ORAL_TABLET | Freq: Once | ORAL | Status: AC
  Administered 2021-12-16: 650 mg via ORAL
  Filled 2021-12-16: qty 2

## 2021-12-16 NOTE — ED Triage Notes (Signed)
Pt states boil on buttocks that started 3 days ago  States purulent drainage per pt

## 2021-12-16 NOTE — ED Provider Notes (Signed)
[facility] EMERGENCY DEPARTMENT Provider Note   CSN: [id number] Arrival date & time: 12/16/21  1253     History  Chief Complaint  Patient presents with   Abscess    Travis Shea is a 66 y.o. male.  Patient is a 66 year old male with no significant past medical history presenting to the emergency department for concern for an abscess to his buttock.  The patient states over the last 5 days he has had an increasing swelling and pain to his right buttock.  He states that this morning he noticed a small amount of drainage that looked like pus.  He denies any fevers or chills, rectal pain, constipation or diarrhea.  He states he has had an abscess once before several years ago on his leg.  The history is provided by the patient.  Abscess      Home Medications Prior to Admission medications   Medication Sig Start Date End Date Taking? Authorizing Provider  doxycycline (VIBRAMYCIN) 100 MG capsule Take 1 capsule (100 mg total) by mouth 2 (two) times daily for 7 days. 12/16/21 12/23/21 Yes Elayne Snare K, DO  acetaminophen (TYLENOL) 500 MG tablet Take 1,000 mg by mouth every 6 (six) hours as needed for pain.    [provider]  ondansetron (ZOFRAN ODT) 4 MG disintegrating tablet 4mg  ODT q4 hours prn nausea/vomit 02/08/20   02/10/20, DO  oxyCODONE-acetaminophen (PERCOCET/ROXICET) 5-325 MG per tablet Take 2 tablets by mouth every 4 (four) hours as needed for pain. 05/25/12   07/25/12, PA-C      Allergies    Morphine and related    Review of Systems   Review of Systems  Physical Exam Updated Vital Signs BP (!) 168/104   Pulse 71   Temp 97.6 F (36.4 C) (Oral)   Resp 14   Ht 5\' 9"  (1.753 m)   Wt 90.7 kg   SpO2 97%   BMI 29.53 kg/m  Physical Exam Vitals and nursing note reviewed.  Constitutional:      General: He is not in acute distress.    Appearance: Normal appearance.  HENT:     Head: Normocephalic and atraumatic.     Nose: Nose  normal.     Mouth/Throat:     Mouth: Mucous membranes are moist.     Pharynx: Oropharynx is clear.  Eyes:     Extraocular Movements: Extraocular movements intact.     Conjunctiva/sclera: Conjunctivae normal.  Pulmonary:     Effort: Pulmonary effort is normal.  Abdominal:     General: Abdomen is flat.  Musculoskeletal:        General: Normal range of motion.     Cervical back: Normal range of motion and neck supple.  Skin:    General: Skin is warm and dry.     Comments: Approximately 3 x 3 cm area of fluctuance with surrounding erythema, warmth and induration on the right buttock near the gluteal cleft, no perianal involvement, scant amount of active drainage  Neurological:     Mental Status: He is alert.     ED Results / Procedures / Treatments   Labs (all labs ordered are listed, but only abnormal results are displayed) Labs Reviewed - No data to display  EKG None  Radiology No results found.  Procedures .Elson AreasIncision and Drainage  Date/Time: 12/16/2021 3:22 PM  Performed by: Marland Kitchen, DO Authorized by: 13/04/2021, DO   Consent:    Consent obtained:  Verbal  Consent given by:  Patient   Risks, benefits, and alternatives were discussed: yes     Risks discussed:  Bleeding, incomplete drainage and pain   Alternatives discussed:  No treatment and delayed treatment Universal protocol:    Procedure explained and questions answered to patient or proxy's satisfaction: yes     Patient identity confirmed:  Verbally with patient Location:    Type:  Abscess   Size:  2 cm   Location: Gluteal. Pre-procedure details:    Skin preparation:  Chlorhexidine with alcohol Sedation:    Sedation type:  None Anesthesia:    Anesthesia method:  Local infiltration   Local anesthetic:  Lidocaine 1% w/o epi Procedure type:    Complexity:  Simple Procedure details:    Ultrasound guidance: yes     Needle aspiration: no     Incision types:  Stab incision    Incision depth:  Dermal   Wound management:  Probed and deloculated   Drainage:  Purulent   Drainage amount:  Moderate   Wound treatment:  Wound left open   Packing materials:  None Post-procedure details:    Procedure completion:  Tolerated well, no immediate complications     Medications Ordered in ED Medications  lidocaine (PF) (XYLOCAINE) 1 % injection 5 mL (5 mLs Infiltration Given 12/16/21 1443)  acetaminophen (TYLENOL) tablet 650 mg (650 mg Oral Given 12/16/21 1443)  ibuprofen (ADVIL) tablet 600 mg (600 mg Oral Given 12/16/21 1443)  doxycycline (VIBRA-TABS) tablet 100 mg (100 mg Oral Given 12/16/21 1443)    ED Course/ Medical Decision Making/ A&P                           Medical Decision Making This patient presents to the ED with chief complaint(s) of abscess with no pertinent past medical history which further complicates the presenting complaint. The complaint involves an extensive differential diagnosis and also carries with it a high risk of complications and morbidity.    The differential diagnosis includes abscess, cellulitis, no perianal involvement making anal rectal abscess unlikely, he is afebrile and hemodynamically stable making sepsis unlikely  Additional history obtained: Additional history obtained from N/A Records reviewed N/A  ED Course and Reassessment: Patient does have an area of fluctuance with surrounding induration, erythema and warmth on his right buttock near the gluteal cleft.  Ultrasound was performed that did show a pocket of fluid.  I&D was performed per procedure note.  Due to patient's surrounding erythema and warmth concern is for surrounding cellulitis and he will be covered with doxycycline.  He was recommended primary care follow-up for wound recheck and was given strict return precautions  Independent labs interpretation:  N/A  Independent visualization of imaging: N/A  Consultation: - Consulted or discussed management/test  interpretation w/ external professional: N/A  Consideration for admission or further workup: Patient has no emergent conditions requiring admission or further work-up at this time and is stable for discharge with primary care follow-up Social Determinants of health: N/A    Risk OTC drugs. Prescription drug management.          Final Clinical Impression(s) / ED Diagnoses Final diagnoses:  Abscess of buttock, right    Rx / DC Orders ED Discharge Orders          Ordered    doxycycline (VIBRAMYCIN) 100 MG capsule  2 times daily        12/16/21 1524  Leanord Asal K, DO 12/16/21 1622

## 2021-12-16 NOTE — ED Notes (Signed)
Reviewed discharge summary, recommendations and antibiotics with pt. Pt states understanding. Questions answered. Pt ambulatory for discharge home

## 2021-12-16 NOTE — ED Notes (Signed)
Dressing applied to right buttocks.

## 2021-12-16 NOTE — Discharge Instructions (Signed)
You were seen in the emergency department for your abscess.  We drained this for you in the emergency department and it is left open so that it can continue to drain.  You can continue to do the warm compresses and take bath soaks to help encourage the drainage.  You did have signs of skin infection around the abscess and we given you antibiotics and you should complete these as prescribed.  You can take Tylenol or Motrin as needed for pain.  You should follow-up with your primary doctor in the next 2 to 3 days to have your wound rechecked.  You should return to the emergency department if you are having increasing pain, fevers, vomiting and cannot tolerate the antibiotics or if you have any other new or concerning symptoms.

## 2022-03-18 ENCOUNTER — Encounter (HOSPITAL_BASED_OUTPATIENT_CLINIC_OR_DEPARTMENT_OTHER): Payer: Self-pay | Admitting: Emergency Medicine

## 2022-03-18 ENCOUNTER — Emergency Department (HOSPITAL_BASED_OUTPATIENT_CLINIC_OR_DEPARTMENT_OTHER)
Admission: EM | Admit: 2022-03-18 | Discharge: 2022-03-18 | Disposition: A | Discharge status: Home / Self Care | Payer: No Typology Code available for payment source | Attending: Emergency Medicine | Admitting: Emergency Medicine

## 2022-03-18 ENCOUNTER — Other Ambulatory Visit: Payer: Self-pay

## 2022-03-18 DIAGNOSIS — J101 Influenza due to other identified influenza virus with other respiratory manifestations: Secondary | ICD-10-CM | POA: Insufficient documentation

## 2022-03-18 DIAGNOSIS — R059 Cough, unspecified: Secondary | ICD-10-CM | POA: Diagnosis present

## 2022-03-18 DIAGNOSIS — R0602 Shortness of breath: Secondary | ICD-10-CM | POA: Diagnosis not present

## 2022-03-18 DIAGNOSIS — Z1152 Encounter for screening for COVID-19: Secondary | ICD-10-CM | POA: Diagnosis not present

## 2022-03-18 DIAGNOSIS — J069 Acute upper respiratory infection, unspecified: Secondary | ICD-10-CM

## 2022-03-18 LAB — RESP PANEL BY RT-PCR (RSV, FLU A&B, COVID)  RVPGX2
Influenza A by PCR: NEGATIVE
Influenza B by PCR: POSITIVE — AB
Resp Syncytial Virus by PCR: NEGATIVE
SARS Coronavirus 2 by RT PCR: NEGATIVE

## 2022-03-18 MED ORDER — BENZONATATE 100 MG PO CAPS: 100.0000 mg | ORAL_CAPSULE | Freq: Three times a day (TID) | ORAL | 0 refills | Status: DC

## 2022-03-18 MED ORDER — ONDANSETRON 4 MG PO TBDP: ORAL_TABLET | ORAL | 0 refills | Status: DC

## 2022-03-18 NOTE — ED Triage Notes (Addendum)
Pt c/o SHOB, HA, dizziness and fatigue x 1 wk

## 2022-03-18 NOTE — ED Provider Notes (Signed)
[place] [facility]ERGENCY DEPARTMENT [facility] Provider Note   CSN: [id number] Arrival date & time: 03/18/22  1110     History  Chief Complaint  Patient presents with   Fatigue    Travis Shea is a 67 y.o. male.  67 yo M with a chief complaints of cough and congestion.  Has been going on for the past week.  He feels a little bit lightheaded at times and feels fatigued and was having more aches than usual.  He typically lifts weights about 3-4 times a week.  Has not been able to do it due to not feeling well.  His wife unfortunately has recently been diagnosed with liver cirrhosis and he spent a lot of time in the hospital hoping to take care of her.        Home Medications Prior to Admission medications   Medication Sig Start Date End Date Taking? Authorizing Provider  benzonatate (TESSALON) 100 MG capsule Take 1 capsule (100 mg total) by mouth every 8 (eight) hours. 03/18/22  Yes Deno Etienne, DO  ondansetron (ZOFRAN-ODT) 4 MG disintegrating tablet 4mg  ODT q4 hours prn nausea/vomit 03/18/22  Yes Deno Etienne, DO  acetaminophen (TYLENOL) 500 MG tablet Take 1,000 mg by mouth every 6 (six) hours as needed for pain.    [provider]  ondansetron (ZOFRAN ODT) 4 MG disintegrating tablet 4mg  ODT q4 hours prn nausea/vomit 02/08/20   Deno Etienne, DO  oxyCODONE-acetaminophen (PERCOCET/ROXICET) 5-325 MG per tablet Take 2 tablets by mouth every 4 (four) hours as needed for pain. 05/25/12   Fransico Meadow, PA-C      Allergies    Morphine and related    Review of Systems   Review of Systems  Physical Exam Updated Vital Signs BP 134/88 (BP Location: Right Arm)   Pulse 89   Temp 97.9 F (36.6 C) (Oral)   Resp 20   Ht 5\' 9"  (1.753 m)   Wt 90.7 kg   SpO2 97%   BMI 29.53 kg/m  Physical Exam Vitals and nursing note reviewed.  Constitutional:      Appearance: He is well-developed.  HENT:     Head: Normocephalic and atraumatic.     Comments: Swollen turbinates,  posterior nasal drip, no noted sinus ttp, tm normal bilaterally.   Eyes:     Pupils: Pupils are equal, round, and reactive to light.  Neck:     Vascular: No JVD.  Cardiovascular:     Rate and Rhythm: Normal rate and regular rhythm.     Heart sounds: No murmur heard.    No friction rub. No gallop.  Pulmonary:     Effort: No respiratory distress.     Breath sounds: No wheezing.  Abdominal:     General: There is no distension.     Tenderness: There is no abdominal tenderness. There is no guarding or rebound.  Musculoskeletal:        General: Normal range of motion.     Cervical back: Normal range of motion and neck supple.  Skin:    Coloration: Skin is not pale.     Findings: No rash.  Neurological:     Mental Status: He is alert and oriented to person, place, and time.  Psychiatric:        Behavior: Behavior normal.     ED Results / Procedures / Treatments   Labs (all labs ordered are listed, but only abnormal results are displayed) Labs Reviewed  RESP PANEL BY RT-PCR (  RSV, FLU A&B, COVID)  [id number]    EKG EKG Interpretation  Date/Time:  Saturday March 18 2022 11:28:29 EST Ventricular Rate:  90 PR Interval:  171 QRS Duration: 99 QT Interval:  349 QTC Calculation: 427 R Axis:   53 Text Interpretation: Sinus rhythm Atrial premature complex Probable anteroseptal infarct, old Nonspecific repol abnormality, diffuse leads No old tracing to compare Confirmed by Deno Etienne [phone]) on 03/18/2022 11:31:01 AM  Radiology No results found.  Procedures Procedures    Medications Ordered in ED Medications - No data to display  ED Course/ Medical Decision Making/ A&P                             Medical Decision Making Risk Prescription drug management.   67 yo M with a chief complaints of cough congestion and wheeze dizziness fatigue muscle aches going on for about a week now.  He is well-appearing nontoxic.  Clear lung sounds, no adventitious lung sounds.  No  tachypnea.  Do not feel that chest x-ray would be helpful at this time.  Less likely patient with viral syndrome.  Discussed supportive care.  PCP follow-up.  12:17 PM:  I have discussed the diagnosis/risks/treatment options with the patient and family.  Evaluation and diagnostic testing in the emergency department does not suggest an emergent condition requiring admission or immediate intervention beyond what has been performed at this time.  They will follow up with PCP. We also discussed returning to the ED immediately if new or worsening sx occur. We discussed the sx which are most concerning (e.g., sudden worsening pain, fever, inability to tolerate by mouth) that necessitate immediate return. Medications administered to the patient during their visit and any new prescriptions provided to the patient are listed below.  Medications given during this visit Medications - No data to display   The patient appears reasonably screen and/or stabilized for discharge and I doubt any other medical condition or other [facility] requiring further screening, evaluation, or treatment in the ED at this time prior to discharge.          Final Clinical Impression(s) / ED Diagnoses Final diagnoses:  URI with cough and congestion    Rx / DC Orders ED Discharge Orders          Ordered    benzonatate (TESSALON) 100 MG capsule  Every 8 hours        03/18/22 1206    ondansetron (ZOFRAN-ODT) 4 MG disintegrating tablet        03/18/22 [place], Javan Gonzaga, DO 03/18/22 1217

## 2022-03-18 NOTE — Discharge Instructions (Signed)
Take tylenol 2 pills 4 times a day and motrin 4 pills 3 times a day.  Drink plenty of fluids.  Return for worsening shortness of breath, headache, confusion. Follow up with your family doctor.   

## 2022-03-27 DIAGNOSIS — H52223 Regular astigmatism, bilateral: Secondary | ICD-10-CM | POA: Diagnosis not present

## 2022-03-27 DIAGNOSIS — H524 Presbyopia: Secondary | ICD-10-CM | POA: Diagnosis not present

## 2022-05-25 ENCOUNTER — Ambulatory Visit: Payer: No Typology Code available for payment source | Admitting: Internal Medicine

## 2022-05-25 ENCOUNTER — Encounter: Payer: Self-pay | Admitting: Internal Medicine

## 2022-05-25 VITALS — BP 140/84 | Temp 97.8°F | Ht 69.0 in | Wt 213.0 lb

## 2022-05-25 DIAGNOSIS — Z1211 Encounter for screening for malignant neoplasm of colon: Secondary | ICD-10-CM

## 2022-05-25 DIAGNOSIS — K219 Gastro-esophageal reflux disease without esophagitis: Secondary | ICD-10-CM | POA: Diagnosis not present

## 2022-05-25 DIAGNOSIS — I10 Essential (primary) hypertension: Secondary | ICD-10-CM

## 2022-05-25 DIAGNOSIS — R5383 Other fatigue: Secondary | ICD-10-CM | POA: Diagnosis not present

## 2022-05-25 DIAGNOSIS — Z13220 Encounter for screening for lipoid disorders: Secondary | ICD-10-CM

## 2022-05-25 DIAGNOSIS — Z125 Encounter for screening for malignant neoplasm of prostate: Secondary | ICD-10-CM

## 2022-05-25 DIAGNOSIS — R739 Hyperglycemia, unspecified: Secondary | ICD-10-CM | POA: Diagnosis not present

## 2022-05-25 LAB — LIPID PANEL
Cholesterol: 148 mg/dL (ref 0–200)
HDL: 25.7 mg/dL — ABNORMAL LOW (ref >39.00)
NonHDL: 121.9
Total CHOL/HDL Ratio: 6
Triglycerides: 236 mg/dL — ABNORMAL HIGH (ref 0.0–149.0)
VLDL: 47.2 mg/dL — ABNORMAL HIGH (ref 0.0–40.0)

## 2022-05-25 LAB — COMPREHENSIVE METABOLIC PANEL
ALT: 45 U/L (ref 0–53)
AST: 72 U/L — ABNORMAL HIGH (ref 0–37)
Albumin: 4 g/dL (ref 3.5–5.2)
Alkaline Phosphatase: 64 U/L (ref 39–117)
BUN: 13 mg/dL (ref 6–23)
CO2: 31 mEq/L (ref 19–32)
Calcium: 10 mg/dL (ref 8.4–10.5)
Chloride: 102 mEq/L (ref 96–112)
Creatinine, Ser: 1.2 mg/dL (ref 0.40–1.50)
GFR: 62.59 mL/min (ref >60.00)
Glucose, Bld: 73 mg/dL (ref 70–99)
Potassium: 3.9 mEq/L (ref 3.5–5.1)
Sodium: 138 mEq/L (ref 135–145)
Total Bilirubin: 0.5 mg/dL (ref 0.2–1.2)
Total Protein: 6.6 g/dL (ref 6.0–8.3)

## 2022-05-25 LAB — LDL CHOLESTEROL, DIRECT: Direct LDL: 95 mg/dL

## 2022-05-25 LAB — VITAMIN B12: Vitamin B-12: 287 pg/mL (ref 211–911)

## 2022-05-25 LAB — TSH: TSH: 2.05 u[IU]/mL (ref 0.35–5.50)

## 2022-05-25 LAB — CBC
HCT: 46.4 % (ref 39.0–52.0)
Hemoglobin: 15.5 g/dL (ref 13.0–17.0)
MCHC: 33.4 g/dL (ref 30.0–36.0)
MCV: 90.2 fl (ref 78.0–100.0)
Platelets: 327 10*3/uL (ref 150.0–400.0)
RBC: 5.14 Mil/uL (ref 4.22–5.81)
RDW: 14.7 % (ref 11.5–15.5)
WBC: 7 10*3/uL (ref 4.0–10.5)

## 2022-05-25 LAB — HEMOGLOBIN A1C: Hgb A1c MFr Bld: 5.7 % (ref 4.6–6.5)

## 2022-05-25 LAB — VITAMIN D 25 HYDROXY (VIT D DEFICIENCY, FRACTURES): VITD: 37.84 ng/mL (ref 30.00–100.00)

## 2022-05-25 LAB — PSA: PSA: 0.64 ng/mL (ref 0.10–4.00)

## 2022-05-25 NOTE — Patient Instructions (Signed)
We will check the labs today and get you in for the colonoscopy in the near future.  Let us know in 1-2 months what the blood pressure is running. The goal is <140/90.

## 2022-05-25 NOTE — Progress Notes (Signed)
   Subjective:   Patient ID: Travis Shea, male    DOB: 28-Jan-1956, 67 y.o.   MRN: [id number]  HPI The patient is a new 67 YO man coming in for ongoing care see A/P for details.   PMH, [facility], social history reviewed and updated  Review of Systems  Constitutional:  Positive for fatigue.  HENT: Negative.    Eyes: Negative.   Respiratory:  Negative for cough, chest tightness and shortness of breath.   Cardiovascular:  Negative for chest pain, palpitations and leg swelling.  Gastrointestinal:  Negative for abdominal distention, abdominal pain, constipation, diarrhea, nausea and vomiting.  Musculoskeletal: Negative.   Skin: Negative.   Neurological: Negative.   Psychiatric/Behavioral: Negative.      Objective:  Physical Exam Constitutional:      Appearance: He is well-developed.  HENT:     Head: Normocephalic and atraumatic.  Cardiovascular:     Rate and Rhythm: Normal rate and regular rhythm.  Pulmonary:     Effort: Pulmonary effort is normal. No respiratory distress.     Breath sounds: Normal breath sounds. No wheezing or rales.  Abdominal:     General: Bowel sounds are normal. There is no distension.     Palpations: Abdomen is soft.     Tenderness: There is no abdominal tenderness. There is no rebound.  Musculoskeletal:     Cervical back: Normal range of motion.  Skin:    General: Skin is warm and dry.  Neurological:     Mental Status: He is alert and oriented to person, place, and time.     Coordination: Coordination normal.     Vitals:   05/25/22 1000 05/25/22 1008  BP: (!) 140/84 (!) 140/84  Temp: 97.8 F (36.6 C)   TempSrc: Oral   Weight: 213 lb (96.6 kg)   Height: 5\' 9"  (1.753 m)     Assessment & Plan:

## 2022-05-26 DIAGNOSIS — I10 Essential (primary) hypertension: Secondary | ICD-10-CM | POA: Insufficient documentation

## 2022-05-26 DIAGNOSIS — K219 Gastro-esophageal reflux disease without esophagitis: Secondary | ICD-10-CM | POA: Insufficient documentation

## 2022-05-26 DIAGNOSIS — R5383 Other fatigue: Secondary | ICD-10-CM | POA: Insufficient documentation

## 2022-05-26 NOTE — Assessment & Plan Note (Signed)
Taking nexium 20 mg daily and rx done today.

## 2022-05-26 NOTE — Assessment & Plan Note (Signed)
Checking HgA1c and vitamin D and B12 to assess. Checking CMP and CBC as well. Treat as appropriate.

## 2022-05-26 NOTE — Assessment & Plan Note (Signed)
Reviewed EKG changes consistent with LVH making this most likely. He would like to cut back on salt in diet first to try to keep BP at goal. We will have close follow up. Checking CMP and CBC and lipid panel.

## 2022-05-29 ENCOUNTER — Encounter: Payer: Self-pay | Admitting: Gastroenterology

## 2022-05-30 ENCOUNTER — Encounter: Payer: Self-pay | Admitting: Internal Medicine

## 2022-05-30 ENCOUNTER — Telehealth: Payer: Self-pay | Admitting: *Deleted

## 2022-05-30 DIAGNOSIS — R7989 Other specified abnormal findings of blood chemistry: Secondary | ICD-10-CM

## 2022-05-30 NOTE — Telephone Encounter (Signed)
Order in.

## 2022-05-30 NOTE — Telephone Encounter (Signed)
Sent pt msg stating order was placed.Marland KitchenRaechel Shea

## 2022-05-30 NOTE — Telephone Encounter (Signed)
Can you convert this information to result note? Thanks  ----- Message -----  From: Deatra James, RMA  Sent: 05/30/2022   9:51 AM EDT  To: Myrlene Broker, MD  Subject: FW: Appointment Request                          Dr. Okey Dupre suggested an Ultra sound for my Liver .Marland Kitchen..seems the counts are high ...is that something that can be done at the office .Marland Kitchen No u/s order in epic...Travis Shea  ----- Message -----  From: Tamela Oddi, CMA  Sent: 05/30/2022   9:02 AM EDT  To: Levonne Lapping, CMA  Subject: FW: Appointment Request                            ----- Message -----  From: Particia Nearing  Sent: 05/30/2022   8:59 AM EDT  To: Okey Dupre Nurse  Subject: FW: Appointment Request                          Good morning! Wanted to make sure this got back to ya'll. I saw the referral to GI in but I wasn't sure if an order for the ultrasound had been done/if it was needed.  ----- Message -----  From: Marguerite Olea"  Sent: 05/29/2022  10:52 AM EDT  To: Waymon Amato Pool  Subject: Appointment Request                              Appointment Request From: Travis Shea    With Provider: Myrlene Broker, MD [facility]]    Preferred Date Range: Any    Preferred Times: Any Time    Reason for visit: Request an Appointment    Comments:  Dr. Okey Dupre suggested an Ultra sound for my Liver .Marland Kitchen..seems the counts are high ...is that something that can be done at the office ..please advise .Marland KitchenMarland KitchenI'm available anytime for an appointment .Marland Kitchen            Tried to convert but not able too.Marland KitchenRaechel Shea

## 2022-06-07 ENCOUNTER — Ambulatory Visit (AMBULATORY_SURGERY_CENTER): Payer: No Typology Code available for payment source

## 2022-06-07 VITALS — Ht 69.0 in | Wt 213.0 lb

## 2022-06-07 DIAGNOSIS — Z1211 Encounter for screening for malignant neoplasm of colon: Secondary | ICD-10-CM

## 2022-06-07 MED ORDER — NA SULFATE-K SULFATE-MG SULF 17.5-3.13-1.6 GM/177ML PO SOLN: 1.0000 | Freq: Once | ORAL | 0 refills | Status: AC

## 2022-06-07 NOTE — Progress Notes (Signed)

## 2022-06-16 ENCOUNTER — Encounter: Payer: Self-pay | Admitting: Gastroenterology

## 2022-06-26 ENCOUNTER — Ambulatory Visit: Payer: No Typology Code available for payment source | Admitting: Gastroenterology

## 2022-06-26 ENCOUNTER — Encounter: Payer: Self-pay | Admitting: Gastroenterology

## 2022-06-26 VITALS — BP 123/87 | HR 65 | Temp 97.5°F | Resp 12 | Ht 69.0 in | Wt 213.0 lb

## 2022-06-26 DIAGNOSIS — D123 Benign neoplasm of transverse colon: Secondary | ICD-10-CM

## 2022-06-26 DIAGNOSIS — D124 Benign neoplasm of descending colon: Secondary | ICD-10-CM | POA: Diagnosis not present

## 2022-06-26 DIAGNOSIS — D127 Benign neoplasm of rectosigmoid junction: Secondary | ICD-10-CM

## 2022-06-26 DIAGNOSIS — D125 Benign neoplasm of sigmoid colon: Secondary | ICD-10-CM

## 2022-06-26 DIAGNOSIS — Z1211 Encounter for screening for malignant neoplasm of colon: Secondary | ICD-10-CM | POA: Diagnosis not present

## 2022-06-26 DIAGNOSIS — D128 Benign neoplasm of rectum: Secondary | ICD-10-CM | POA: Diagnosis not present

## 2022-06-26 DIAGNOSIS — D120 Benign neoplasm of cecum: Secondary | ICD-10-CM

## 2022-06-26 MED ORDER — SODIUM CHLORIDE 0.9 % IV SOLN: 500.0000 mL | Freq: Once | INTRAVENOUS | Status: DC

## 2022-06-26 NOTE — Progress Notes (Signed)
Uneventful anesthetic. Report to pacu rn. Vss. Care resumed by rn. 

## 2022-06-26 NOTE — Progress Notes (Signed)
Called to room to assist during endoscopic procedure.  Patient ID and intended procedure confirmed with present staff. Received instructions for my participation in the procedure from the performing physician.  

## 2022-06-26 NOTE — Progress Notes (Signed)
Patient having relief of gas pains, he is passing a lot gas/belching.

## 2022-06-26 NOTE — Progress Notes (Signed)
[facility] History and Physical   Primary Care Physician:  Myrlene Broker, MD   Reason for Procedure:   Colon cancer screening  Plan:    colonoscopy     HPI: Travis Shea is a 67 y.o. male  here for colonoscopy screening - last exam > 10 years ago   . Patient denies any bowel symptoms at this time. Does have chronic bloating and GERD, takes nexium. No family history of colon cancer known. Otherwise feels well without any cardiopulmonary symptoms.   I have discussed risks / benefits of anesthesia and endoscopic procedure with [name] and they wish to proceed with the exams as outlined today.    Past Medical History:  Diagnosis Date   GERD (gastroesophageal reflux disease)    Hypertension    Left knee injury     Past Surgical History:  Procedure Laterality Date   COLONOSCOPY     FOOT FRACTURE SURGERY Right    KNEE SURGERY     TOTAL HIP ARTHROPLASTY Right     Prior to Admission medications   Medication Sig Start Date End Date Taking? Authorizing Provider  esomeprazole (NEXIUM) 20 MG capsule Take 20 mg by mouth daily at 12 noon.   Yes [provider]    Current Outpatient Medications  Medication Sig Dispense Refill   esomeprazole (NEXIUM) 20 MG capsule Take 20 mg by mouth daily at 12 noon.     Current Facility-Administered Medications  Medication Dose Route Frequency Provider Last Rate Last Admin   0.9 %  sodium chloride infusion  500 mL Intravenous Once Melodie Ashworth, Willaim Rayas, MD        Allergies as of 06/26/2022 - Review Complete 06/26/2022  Allergen Reaction Noted   Morphine and related Nausea And Vomiting 05/25/2012   Other Nausea And Vomiting 05/25/2012   Buprenorphine hcl Nausea And Vomiting 03/18/2015    Family History  Problem Relation Age of Onset   Colon cancer Neg Hx    Colon polyps Neg Hx    Rectal cancer Neg Hx    Stomach cancer Neg Hx     Social History   Socioeconomic History   Marital status:  Married    Spouse name: Not on file   Number of children: Not on file   Years of education: Not on file   Highest education level: Not on file  Occupational History   Not on file  Tobacco Use   Smoking status: Never   Smokeless tobacco: Never  Vaping Use   Vaping Use: Never used  Substance and Sexual Activity   Alcohol use: No   Drug use: No   Sexual activity: Not on file  Other Topics Concern   Not on file  Social History Narrative   Not on file   Social Determinants of Health   Financial Resource Strain: Not on file  Food Insecurity: Not on file  Transportation Needs: Not on file  Physical Activity: Not on file  Stress: Not on file  Social Connections: Not on file  Intimate Partner Violence: Not on file    Review of Systems: All other review of systems negative except as mentioned in the HPI.  Physical Exam: Vital signs BP (!) 162/86   Pulse 72   Temp (!) 97.5 F (36.4 C)   Ht 5\' 9"  (1.753 m)   Wt 213 lb (96.6 kg)   SpO2 100%   BMI 31.45 kg/m   General:   Alert,  Well-developed, pleasant and cooperative in NAD Lungs:  Clear throughout to auscultation.   Heart:  Regular rate and rhythm Abdomen:  Soft, nontender and nondistended.   Neuro/Psych:  Alert and cooperative. Normal mood and affect. A and O x 3  Jolly Mango, MD [facility]

## 2022-06-26 NOTE — Patient Instructions (Signed)
Handout on polyps and diverticulosis given  apointment scheduled.     YOU HAD AN ENDOSCOPIC PROCEDURE TODAY AT THE [facility]:   Refer to the procedure report that was given to you for any specific questions about what was found during the examination.  If the procedure report does not answer your questions, please call your gastroenterologist to clarify.  If you requested that your care partner not be given the details of your procedure findings, then the procedure report has been included in a sealed envelope for you to review at your convenience later.  YOU SHOULD EXPECT: Some feelings of bloating in the abdomen. Passage of more gas than usual.  Walking can help get rid of the air that was put into your GI tract during the procedure and reduce the bloating. If you had a lower endoscopy (such as a colonoscopy or flexible sigmoidoscopy) you may notice spotting of blood in your stool or on the toilet paper. If you underwent a bowel prep for your procedure, you may not have a normal bowel movement for a few days.  Please Note:  You might notice some irritation and congestion in your nose or some drainage.  This is from the oxygen used during your procedure.  There is no need for concern and it should clear up in a day or so.  SYMPTOMS TO REPORT IMMEDIATELY:  Following lower endoscopy (colonoscopy or flexible sigmoidoscopy):  Excessive amounts of blood in the stool  Significant tenderness or worsening of abdominal pains  Swelling of the abdomen that is new, acute  Fever of 100F or higher   For urgent or emergent issues, a gastroenterologist can be reached at any hour by calling [phone]. Do not use MyChart messaging for urgent concerns.    DIET:  We do recommend a small meal at first, but then you may proceed to your regular diet.  Drink plenty of fluids but you should avoid alcoholic beverages for 24 hours.  ACTIVITY:  You should plan to take it easy for the rest of today  and you should NOT DRIVE or use heavy machinery until tomorrow (because of the sedation medicines used during the test).    FOLLOW UP: Our staff will call the number listed on your records the next business day following your procedure.  We will call around 7:15- 8:00 am to check on you and address any questions or concerns that you may have regarding the information given to you following your procedure. If we do not reach you, we will leave a message.     If any biopsies were taken you will be contacted by phone or by letter within the next 1-3 weeks.  Please call us at [phone] if you have not heard about the biopsies in 3 weeks.    SIGNATURES/CONFIDENTIALITY: You and/or your care partner have signed paperwork which will be entered into your electronic medical record.  These signatures attest to the fact that that the information above on your After Visit Summary has been reviewed and is understood.  Full responsibility of the confidentiality of this discharge information lies with you and/or your care-partner.

## 2022-06-26 NOTE — Progress Notes (Signed)
VS by EC  Pt's states no medical or surgical changes since previsit or office visit.  

## 2022-06-26 NOTE — Op Note (Signed)
[facility] Patient Name: Travis Shea Procedure Date: 06/26/2022 10:28 AM MRN: [id number] Endoscopist: Viviann Spare P. Adela Lank , MD, [id number] Age: 67 Referring MD:  Date of Birth: 1955/05/26 Gender: Male Account #: [id number] Procedure:                Colonoscopy Indications:              Screening for colorectal malignant neoplasm Medicines:                Monitored Anesthesia Care Procedure:                Pre-Anesthesia Assessment:                           - Prior to the procedure, a History and Physical                            was performed, and patient medications and                            allergies were reviewed. The patient's tolerance of                            previous anesthesia was also reviewed. The risks                            and benefits of the procedure and the sedation                            options and risks were discussed with the patient.                            All questions were answered, and informed consent                            was obtained. Prior Anticoagulants: The patient has                            taken no anticoagulant or antiplatelet agents. ASA                            Grade Assessment: II - A patient with mild systemic                            disease. After reviewing the risks and benefits,                            the patient was deemed in satisfactory condition to                            undergo the procedure.                           After obtaining informed consent, the colonoscope  was passed under direct vision. Throughout the                            procedure, the patient's blood pressure, pulse, and                            oxygen saturations were monitored continuously. The                            CF [id number] #[id number] was introduced through the anus                            and advanced to the the cecum, identified by                             appendiceal orifice and ileocecal valve. The                            colonoscopy was performed without difficulty. The                            patient tolerated the procedure well. The quality                            of the bowel preparation was good. The ileocecal                            valve, appendiceal orifice, and rectum were                            photographed. Scope In: 10:35:26 AM Scope Out: 10:58:37 AM Scope Withdrawal Time: 0 hours 13 minutes 38 seconds  Total Procedure Duration: 0 hours 23 minutes 11 seconds  Findings:                 Skin tags were found on perianal exam.                           A 3 mm polyp was found in the cecum. The polyp was                            sessile. The polyp was removed with a cold snare.                            Resection and retrieval were complete.                           Three sessile polyps were found in the hepatic                            flexure. The polyps were 4 to 6 mm in size. These                            polyps were  removed with a cold snare. Resection                            and retrieval were complete.                           There was a lipoma, in the transverse colon.                           Three sessile polyps were found in the transverse                            colon. The polyps were 3 to 5 mm in size. These                            polyps were removed with a cold snare. Resection                            and retrieval were complete.                           A 5 mm polyp was found in the sigmoid colon. The                            polyp was sessile. The polyp was removed with a                            cold snare. Resection and retrieval were complete.                           A 3 mm polyp was found in the rectum. The polyp was                            sessile. The polyp was removed with a cold snare.                            Resection and retrieval were complete.                            Multiple small-mouthed diverticula were found in                            the sigmoid colon and ascending colon.                           Internal hemorrhoids were found during retroflexion.                           The exam was otherwise without abnormality. Complications:            No immediate complications. Estimated blood loss:                            Minimal. Estimated Blood  Loss:     Estimated blood loss was minimal. Impression:               - Perianal skin tags found on perianal exam.                           - One 3 mm polyp in the cecum, removed with a cold                            snare. Resected and retrieved.                           - Three 4 to 6 mm polyps at the hepatic flexure,                            removed with a cold snare. Resected and retrieved.                           - Lipoma in the transverse colon.                           - Three 3 to 5 mm polyps in the transverse colon,                            removed with a cold snare. Resected and retrieved.                           - One 5 mm polyp in the sigmoid colon, removed with                            a cold snare. Resected and retrieved.                           - One 3 mm polyp in the rectum, removed with a cold                            snare. Resected and retrieved.                           - Diverticulosis in the sigmoid colon and in the                            ascending colon.                           - Internal hemorrhoids.                           - The examination was otherwise normal.                           - The GI Genius (intelligent endoscopy module),  computer-aided polyp detection system powered by AI                            was utilized to detect colorectal polyps through                            enhanced visualization during colonoscopy. Recommendation:           - Patient has a contact number available for                             emergencies. The signs and symptoms of potential                            delayed complications were discussed with the                            patient. Return to normal activities tomorrow.                            Written discharge instructions were provided to the                            patient.                           - Resume previous diet.                           - Continue present medications.                           - Await pathology results. Viviann Spare P. Juris Gosnell, MD 06/26/2022 11:05:15 AM This report has been signed electronically.

## 2022-06-27 ENCOUNTER — Telehealth: Payer: Self-pay

## 2022-06-27 NOTE — Telephone Encounter (Signed)
Inbound call from patient, states he is feeling better today, states yesterday after his procedure he was experiencing abdominal pain, and also stated he felt hemorrhoids and would like to discuss if this is normal after a procedure.

## 2022-06-27 NOTE — Telephone Encounter (Signed)
Attempted to return pt's call. No answer. Voice mailbox is full, unable to leave message. Will try pt again later.

## 2022-06-27 NOTE — Telephone Encounter (Signed)
No answer on follow up call. Mailbox full.  

## 2022-06-29 ENCOUNTER — Encounter: Payer: Self-pay | Admitting: Gastroenterology

## 2022-06-29 ENCOUNTER — Ambulatory Visit
Admission: RE | Admit: 2022-06-29 | Discharge: 2022-06-29 | Disposition: A | Discharge status: Home / Self Care | Payer: No Typology Code available for payment source | Source: Ambulatory Visit | Attending: Internal Medicine | Admitting: Internal Medicine

## 2022-06-29 ENCOUNTER — Encounter: Payer: Self-pay | Admitting: Internal Medicine

## 2022-06-29 DIAGNOSIS — K824 Cholesterolosis of gallbladder: Secondary | ICD-10-CM | POA: Diagnosis not present

## 2022-06-29 DIAGNOSIS — R7989 Other specified abnormal findings of blood chemistry: Secondary | ICD-10-CM

## 2022-06-29 DIAGNOSIS — R748 Abnormal levels of other serum enzymes: Secondary | ICD-10-CM | POA: Diagnosis not present

## 2022-06-30 NOTE — Telephone Encounter (Signed)
At this point in time, there should not be any further anesthesia in his system. Does he take his blood pressure regularly or have ability to check this at home?  Are his heart rates elevated? Would also confirm that he isn't having any issues in regards to other symptoms of an upper respiratory infection that may be causing some symptoms of fullness in ears or potentially affecting his sinuses. He should maintain good hydration. Can use Meclizine if he is having any symptoms of nausea with the dizziness. He should have a set of laboratories performed to check his electrolytes and blood counts (CBC/CMP/Mg/Phos), but as it is the end of the week the lab will not be available.   If this is still occurring after good hydration and he is not having any issues with his blood pressure, then he should be recommended to be evaluated at an urgent care facility to ensure he is not having anything going on further. Otherwise can certainly place referral to neurology as well if this still persists.

## 2022-06-30 NOTE — Telephone Encounter (Signed)
DOD PM 5/17 - Dr. Meridee Score, please see note from patient.   Dr. Lanetta Inch pt that had colonoscopy on 06/26/22  - screening colonoscopy.

## 2022-07-01 ENCOUNTER — Encounter: Payer: Self-pay | Admitting: Gastroenterology

## 2022-07-03 NOTE — Telephone Encounter (Signed)
See 5/18 patient message

## 2022-09-08 ENCOUNTER — Telehealth: Payer: Self-pay | Admitting: Internal Medicine

## 2022-09-08 NOTE — Telephone Encounter (Signed)
I don't see any symptoms listed to give advice

## 2022-09-08 NOTE — Telephone Encounter (Signed)
Patient scheduled an OV with [name] on 09/11/22 for a possible ear infection. He would like to know if there is something OTC recommended for over the weekend. Patient would like a call back at [phone].

## 2022-09-11 ENCOUNTER — Ambulatory Visit (INDEPENDENT_AMBULATORY_CARE_PROVIDER_SITE_OTHER): Payer: No Typology Code available for payment source | Admitting: Family Medicine

## 2022-09-11 VITALS — BP 164/98 | HR 80 | Temp 97.8°F | Resp 20 | Ht 69.0 in | Wt 204.0 lb

## 2022-09-11 DIAGNOSIS — S00411A Abrasion of right ear, initial encounter: Secondary | ICD-10-CM

## 2022-09-11 DIAGNOSIS — H6122 Impacted cerumen, left ear: Secondary | ICD-10-CM | POA: Diagnosis not present

## 2022-09-11 DIAGNOSIS — H60312 Diffuse otitis externa, left ear: Secondary | ICD-10-CM | POA: Diagnosis not present

## 2022-09-11 DIAGNOSIS — I10 Essential (primary) hypertension: Secondary | ICD-10-CM

## 2022-09-11 MED ORDER — LISINOPRIL 10 MG PO TABS: 10.0000 mg | ORAL_TABLET | Freq: Every day | ORAL | 2 refills | Status: DC

## 2022-09-11 MED ORDER — NEOMYCIN-POLYMYXIN-HC 3.5-10000-1 OT SOLN: 3.0000 [drp] | Freq: Three times a day (TID) | OTIC | 0 refills | Status: AC

## 2022-09-11 NOTE — Progress Notes (Signed)
Assessment & Plan:  1. Abrasion of right ear canal, initial encounter - neomycin-polymyxin-hydrocortisone (CORTISPORIN) OTIC solution; Place 3 drops into both ears 3 (three) times daily for 3 days.  Dispense: 10 mL; Refill: 0  2. Impacted cerumen of left ear Left ear irrigated successfully.  Patient tolerated well.  3. Acute diffuse otitis externa of left ear - neomycin-polymyxin-hydrocortisone (CORTISPORIN) OTIC solution; Place 3 drops into both ears 3 (three) times daily for 3 days.  Dispense: 10 mL; Refill: 0  4. Primary hypertension Uncontrolled.  Education provided on hypertension.  Encouraged patient to stay active and decrease his salt intake.  Started on lisinopril 10 mg once daily. - lisinopril (ZESTRIL) 10 MG tablet; Take 1 tablet (10 mg total) by mouth daily.  Dispense: 30 tablet; Refill: 2   Follow up plan: Return in about 4 weeks (around 10/09/2022) for HTN with PCP.  Travis Boston, MSN, APRN, FNP-C  Subjective:  HPI: Travis Shea is a 67 y.o. male presenting on 09/11/2022 for Ear Fullness (Right ear - fullness, doesn't drain well. Had some pain in it Friday, better now )  Patient complains of right ear pain that occurred 3 days ago.  States he has a lot of wax buildup in his ears and has to occasionally get them flushed out.  He has been instilling hydrogen peroxide in the right ear over the weekend.  Hypertension: Patient is very active.  He does not monitor his salt intake.  He does occasionally check his blood pressure at [facility] and reports his readings are always high; the lowest the top number has been is in the 160s.  He reports fatigue and headaches.   ROS: Negative unless specifically indicated above in HPI.   Relevant past medical history reviewed and updated as indicated.   Allergies and medications reviewed and updated.   Current Outpatient Medications:    esomeprazole (NEXIUM) 20 MG capsule, Take 20 mg by mouth daily at 12 noon., Disp: , Rfl:    Allergies  Allergen Reactions   Morphine And Codeine Nausea And Vomiting   Other Nausea And Vomiting   Buprenorphine Hcl Nausea And Vomiting    Objective:   BP (!) 164/98   Pulse 80   Temp 97.8 F (36.6 C)   Resp 20   Ht 5\' 9"  (1.753 m)   Wt 204 lb (92.5 kg)   BMI 30.13 kg/m    Physical Exam Vitals reviewed.  Constitutional:      General: He is not in acute distress.    Appearance: Normal appearance. He is not ill-appearing, toxic-appearing or diaphoretic.  HENT:     Head: Normocephalic and atraumatic.     Right Ear: External ear normal. Laceration present. Tympanic membrane is scarred. Tympanic membrane is not injected, perforated, erythematous, retracted or bulging.     Left Ear: Tympanic membrane and external ear normal. Swelling (moist and white) present. There is impacted cerumen.  Eyes:     General: No scleral icterus.       Right eye: No discharge.        Left eye: No discharge.     Conjunctiva/sclera: Conjunctivae normal.  Cardiovascular:     Rate and Rhythm: Normal rate and regular rhythm.     Heart sounds: Normal heart sounds. No murmur heard.    No friction rub. No gallop.  Pulmonary:     Effort: Pulmonary effort is normal. No respiratory distress.     Breath sounds: Normal breath sounds. No stridor. No wheezing, rhonchi or  rales.  Musculoskeletal:        General: Normal range of motion.     Cervical back: Normal range of motion.  Skin:    General: Skin is warm and dry.  Neurological:     Mental Status: He is alert and oriented to person, place, and time. Mental status is at baseline.  Psychiatric:        Mood and Affect: Mood normal.        Behavior: Behavior normal.        Thought Content: Thought content normal.        Judgment: Judgment normal.

## 2022-09-13 ENCOUNTER — Encounter (INDEPENDENT_AMBULATORY_CARE_PROVIDER_SITE_OTHER): Payer: Self-pay

## 2022-09-29 ENCOUNTER — Encounter: Payer: Self-pay | Admitting: Gastroenterology

## 2022-09-29 ENCOUNTER — Ambulatory Visit (INDEPENDENT_AMBULATORY_CARE_PROVIDER_SITE_OTHER): Payer: No Typology Code available for payment source | Admitting: Gastroenterology

## 2022-09-29 ENCOUNTER — Other Ambulatory Visit (HOSPITAL_COMMUNITY): Payer: Self-pay

## 2022-09-29 ENCOUNTER — Other Ambulatory Visit (INDEPENDENT_AMBULATORY_CARE_PROVIDER_SITE_OTHER): Payer: No Typology Code available for payment source

## 2022-09-29 VITALS — BP 144/84 | HR 72 | Ht 68.0 in | Wt 212.1 lb

## 2022-09-29 DIAGNOSIS — K760 Fatty (change of) liver, not elsewhere classified: Secondary | ICD-10-CM

## 2022-09-29 DIAGNOSIS — Z79899 Other long term (current) drug therapy: Secondary | ICD-10-CM | POA: Diagnosis not present

## 2022-09-29 DIAGNOSIS — R109 Unspecified abdominal pain: Secondary | ICD-10-CM

## 2022-09-29 DIAGNOSIS — K219 Gastro-esophageal reflux disease without esophagitis: Secondary | ICD-10-CM

## 2022-09-29 DIAGNOSIS — R110 Nausea: Secondary | ICD-10-CM

## 2022-09-29 DIAGNOSIS — R140 Abdominal distension (gaseous): Secondary | ICD-10-CM

## 2022-09-29 LAB — IBC + FERRITIN
Ferritin: 24.3 ng/mL (ref 22.0–322.0)
Iron: 83 ug/dL (ref 42–165)
Saturation Ratios: 21.2 % (ref 20.0–50.0)
TIBC: 390.6 ug/dL (ref 250.0–450.0)
Transferrin: 279 mg/dL (ref 212.0–360.0)

## 2022-09-29 MED ORDER — PANTOPRAZOLE SODIUM 40 MG PO TBEC
40.0000 mg | DELAYED_RELEASE_TABLET | Freq: Two times a day (BID) | ORAL | 1 refills | Status: DC
  Filled 2022-09-29: qty 180, 90d supply, fill #0

## 2022-09-29 MED ORDER — SUCRALFATE 1 G PO TABS
1.0000 g | ORAL_TABLET | ORAL | 1 refills | Status: DC | PRN
  Filled 2022-09-29: qty 60, 15d supply, fill #0

## 2022-09-29 MED ORDER — ONDANSETRON 4 MG PO TBDP
4.0000 mg | ORAL_TABLET | Freq: Three times a day (TID) | ORAL | 1 refills | Status: DC | PRN
  Filled 2022-09-29: qty 30, 10d supply, fill #0

## 2022-09-29 NOTE — Patient Instructions (Addendum)
Please go to the lab in the basement of our building to have lab work done as you leave today. Hit "B" for basement when you get on the elevator.  When the doors open the lab is on your left.  We will call you with the results. Thank you.  You have been scheduled for an endoscopy. Please follow written instructions given to you at your visit today.  If you use inhalers (even only as needed), please bring them with you on the day of your procedure.  If you take any of the following medications, they will need to be adjusted prior to your procedure:   DO NOT TAKE 7 DAYS PRIOR TO TEST- Trulicity (dulaglutide) Ozempic, Wegovy (semaglutide) Mounjaro (tirzepatide) Bydureon Bcise (exanatide extended release)  DO NOT TAKE 1 DAY PRIOR TO YOUR TEST Rybelsus (semaglutide) Adlyxin (lixisenatide) Victoza (liraglutide) Byetta (exanatide) ___________________________________________________________________________  Stop all carbonated beverages  Try Gas-X over-the-counter as needed.  Stop Nexium.  We have sent the following medications to your pharmacy for you to pick up at your convenience: Protonix 40 mg: Take twice a day Carafate tablets: Take as needed Zofran 4 mg ODT: Take every 8 hours as needed  Decrease your alcohol intake.  Thank you for entrusting me with your care and for choosing [facility], Dr. Ileene Patrick

## 2022-09-29 NOTE — Progress Notes (Signed)
HPI :  67 year old male with a history of colon polyps, elevated liver enzymes, fatty liver, GERD here for follow-up visit for symptoms of bloating and GERD.  I recently saw him for his colonoscopy in May.  He had 9 adenomas removed and recommended repeat exam in 3 years.  He is accompanied by his wife today.  The main reason he is here is for symptoms of upper abdominal bloating and distention.  He states this has been going on for "several years" but he feels like it is getting worse over time.  His upper abdomen feels constantly distended and bloated.  Some days are not as bad as others.  He is denies any pain with this but states he can be very uncomfortable when it gets bad.  It usually lasts most every day and all day.  He stopped doing core exercises as he thought it was related, he lifts weights routinely.  200 pounds this is normal weight, he is gained about 10 pounds over the past several months.  He drinks carbonated beverages routinely, states he drinks a lot of carbonated beverages.  He has alcohol on most days of the week, drinks about 3 alcoholic beverages per day when he does drink.  No tobacco use.  He endorses routine use of proton pump inhibitor for years for management of reflux.  Pyrosis and regurgitation are his main symptoms.  He does have some nausea frequently but does not vomit.  He states he has been on Nexium over-the-counter for years as well as Prilosec and continues to have significant reflux that bothers him daily despite taking Nexium twice daily.  He states he has rare early satiety, states some food" in his stomach" after he eats sometimes.  His appetite is good.  He has been using about a bottle of Tums per week for breakthrough reflux.  He had an EGD about 20 years ago and states there was an abnormality or damage to his esophagus but he does not recall.  He has not had an exam since then.  Of note he had a right upper quadrant ultrasound done this past May, he has  had some elevation in his AST and ALT that have fluctuated over the past few years. AST 40s-72, ALT 40s.  Ultrasound shows steatosis, also some sludge in the gallbladder lumen but no stones and a diminutive gallbladder polyp.  He has not had a prior serologic workup for chronic liver diseases.  Denies any family history of liver disease.    Colonoscopy 06/26/22: - Skin tags were found on perianal exam. - A 3 mm polyp was found in the cecum. The polyp was sessile. The polyp was removed with a cold snare. Resection and retrieval were complete. - Three sessile polyps were found in the hepatic flexure. The polyps were 4 to 6 mm in size. These polyps were removed with a cold snare. Resection and retrieval were complete. - There was a lipoma, in the transverse colon. - Three sessile polyps were found in the transverse colon. The polyps were 3 to 5 mm in size. These polyps were removed with a cold snare. Resection and retrieval were complete. - A 5 mm polyp was found in the sigmoid colon. The polyp was sessile. The polyp was removed with a cold snare. Resection and retrieval were complete. - A 3 mm polyp was found in the rectum. The polyp was sessile. The polyp was removed with a cold snare. Resection and retrieval were complete. - Multiple small-mouthed  diverticula were found in the sigmoid colon and ascending colon. - Internal hemorrhoids were found during retroflexion. - The exam was otherwise without abnormality.  1. Surgical [P], colon, rectum, sigmoid, polyp (2) TUBULAR ADENOMAS NEGATIVE FOR HIGH-GRADE DYSPLASIA AND CARCINOMA 2. Surgical [P], colon, transverse, hepatic flexure, cecal, polyp (7) TUBULAR ADENOMA, 15 FRAGMENTS NEGATIVE FOR HIGH-GRADE DYSPLASIA AND CARCINOMA  Repeat in 3 years for 9 adenomas   RUQ [place] 06/29/22: IMPRESSION: 1. Increased hepatic parenchymal echogenicity suggestive of steatosis. 2. Small amount of sludge in the gallbladder lumen. No secondary signs to suggest acute  cholecystitis. 3. 3 mm gallbladder polyp.  No imaging follow-up needed.    Past Medical History:  Diagnosis Date   GERD (gastroesophageal reflux disease)    Hypertension    Left knee injury      Past Surgical History:  Procedure Laterality Date   COLONOSCOPY     FOOT FRACTURE SURGERY Right    KNEE SURGERY     TOTAL HIP ARTHROPLASTY Right    Family History  Problem Relation Age of Onset   Dementia Mother    Lung cancer Father    Other Sister        Lyme's disease   Diabetes Maternal Grandmother    Colon cancer Neg Hx    Colon polyps Neg Hx    Rectal cancer Neg Hx    Stomach cancer Neg Hx    Social History   Tobacco Use   Smoking status: Never   Smokeless tobacco: Never  Vaping Use   Vaping status: Never Used  Substance Use Topics   Alcohol use: No   Drug use: No   Current Outpatient Medications  Medication Sig Dispense Refill   lisinopril (ZESTRIL) 10 MG tablet Take 1 tablet (10 mg total) by mouth daily. 30 tablet 2   esomeprazole (NEXIUM) 20 MG capsule Take 20 mg by mouth daily at 12 noon. (Patient not taking: Reported on 09/29/2022)     No current facility-administered medications for this visit.   Allergies  Allergen Reactions   Morphine And Codeine Nausea And Vomiting   Other Nausea And Vomiting   Buprenorphine Hcl Nausea And Vomiting     Review of Systems: All systems reviewed and negative except where noted in HPI.   Lab Results  Component Value Date   ALT 45 05/25/2022   AST 72 (H) 05/25/2022   ALKPHOS 64 05/25/2022   BILITOT 0.5 05/25/2022   Lab Results  Component Value Date   WBC 7.0 05/25/2022   HGB 15.5 05/25/2022   HCT 46.4 05/25/2022   MCV 90.2 05/25/2022   PLT 327.0 05/25/2022     Physical Exam: BP (!) 144/84 (BP Location: Left Arm, Patient Position: Sitting, Cuff Size: Large)   Pulse 72   Ht 5\' 8"  (1.727 m) Comment: height measured without shoes  Wt 212 lb 2 oz (96.2 kg)   BMI 32.25 kg/m  Constitutional:  Pleasant,well-developed, male in no acute distress. Abdominal: Soft, somewhat distended, muscular abdomen, nontender, mild diastasis recti. There are no masses palpable. No hepatomegaly. Neurological: Alert and oriented to person place and time. Skin: Skin is warm and dry. No rashes noted. Psychiatric: Normal mood and affect. Behavior is normal.   Fibrosis 4 Score = 2.2       Fib-4 interpretation is not validated for people under 35 or over 2 years of age.    ASSESSMENT: 67 y.o. male here for assessment of the following  1. Bloating   2. Abdominal discomfort  3. Gastroesophageal reflux disease, unspecified whether esophagitis present   4. Long-term current use of proton pump inhibitor therapy   5. Nausea without vomiting   6. Fatty liver    Multiple issues discussed today.  Significant abdominal bloating causing discomfort.  He drinks a lot of carbonated beverages which could be leading to distention of his bowel and related to this.  I recommend he stop all carbonated beverages to see if this provides any benefit.  He can also do a trial of Gas-X as needed.  I will also screen him for celiac disease to make sure okay.  In the setting of this he is also had significant reflux symptoms that bother him and are poorly controlled on his current dosing of Nexium.  He has had longstanding GERD, reportedly an abnormal EGD a long time ago, unclear if he has Barrett's on last exam.  Given his abdominal discomfort and his reflux symptoms I recommend an EGD to further evaluate.  I discussed the risks and benefits of endoscopy and anesthesia and he wants to proceed.  While we are waiting to do this, recommend we switch from Nexium to Protonix, 40 mg twice daily, and start Carafate trial to see if that will help as well to use as needed.  He does also have some periodic nausea and he states Zofran works well for that, will give him some Zofran to use as needed.  I did discuss the long-term risks of  chronic PPI use and he has been on them for some time.  Kidney function normal, no history of osteoporosis etc.  He understands risks and wishes to continue for now.  Otherwise he has a mild intermittent transaminitis.  He lifts weights and is very muscular, unsure if this represents muscle breakdown or changes from fatty liver.  He does drink alcohol routinely and this could be related to fatty liver.  I recommend he reduce his alcohol intake and work on weight loss for fatty liver.  Otherwise we will screen him for other causes of chronic liver disease with serologies, further recommendations pending that result.  I should see him at least yearly for this issue.  Platelets are normal and is reassuring.  No evidence of cirrhosis on imaging   PLAN: - stop all carbonated beverages - trial of gas-ex PRN - schedule EGD at the [facility] - lab for celiac serologies - stop nexium - start protonix 40mg  BID - start carafate tablet PRN - start Zofran 4mg  ODT every 8 hours PRN  - counseled on fatty liver, risks for cirrhosis - labs for chronic liver disease - reduce EtOH intake - work on weight loss - LFTs yearly  Harlin Rain, MD [facility]

## 2022-09-30 ENCOUNTER — Encounter: Payer: Self-pay | Admitting: Certified Registered Nurse Anesthetist

## 2022-10-03 LAB — ALPHA-1-ANTITRYPSIN: A-1 Antitrypsin, Ser: 154 mg/dL (ref 83–199)

## 2022-10-03 LAB — ANTI-SMOOTH MUSCLE ANTIBODY, IGG: Actin (Smooth Muscle) Antibody (IGG): <20 U (ref <20)

## 2022-10-03 LAB — HEPATITIS B SURFACE ANTIBODY,QUALITATIVE: Hep B S Ab: NONREACTIVE

## 2022-10-03 LAB — ANA: Anti Nuclear Antibody (ANA): NEGATIVE

## 2022-10-03 LAB — HEPATITIS C ANTIBODY: Hepatitis C Ab: NONREACTIVE

## 2022-10-03 LAB — TISSUE TRANSGLUTAMINASE, IGA: (tTG) Ab, IgA: <1 U/mL

## 2022-10-03 LAB — HEPATITIS A ANTIBODY, TOTAL: Hepatitis A AB,Total: NONREACTIVE

## 2022-10-03 LAB — IGG: IgG (Immunoglobin G), Serum: 1263 mg/dL (ref 600–1540)

## 2022-10-03 LAB — IGA: Immunoglobulin A: 263 mg/dL (ref 70–320)

## 2022-10-03 LAB — HEPATITIS B SURFACE ANTIGEN: Hepatitis B Surface Ag: NONREACTIVE

## 2022-10-04 ENCOUNTER — Other Ambulatory Visit: Payer: Self-pay

## 2022-10-09 ENCOUNTER — Ambulatory Visit (AMBULATORY_SURGERY_CENTER): Payer: No Typology Code available for payment source | Admitting: Gastroenterology

## 2022-10-09 ENCOUNTER — Encounter: Payer: Self-pay | Admitting: Gastroenterology

## 2022-10-09 VITALS — BP 182/85 | HR 68 | Temp 98.7°F | Resp 10 | Ht 68.0 in | Wt 212.0 lb

## 2022-10-09 DIAGNOSIS — K2970 Gastritis, unspecified, without bleeding: Secondary | ICD-10-CM | POA: Diagnosis not present

## 2022-10-09 DIAGNOSIS — B9681 Helicobacter pylori [H. pylori] as the cause of diseases classified elsewhere: Secondary | ICD-10-CM | POA: Diagnosis not present

## 2022-10-09 DIAGNOSIS — I10 Essential (primary) hypertension: Secondary | ICD-10-CM | POA: Diagnosis not present

## 2022-10-09 DIAGNOSIS — K2270 Barrett's esophagus without dysplasia: Secondary | ICD-10-CM | POA: Diagnosis not present

## 2022-10-09 DIAGNOSIS — K2950 Unspecified chronic gastritis without bleeding: Secondary | ICD-10-CM | POA: Diagnosis not present

## 2022-10-09 DIAGNOSIS — K219 Gastro-esophageal reflux disease without esophagitis: Secondary | ICD-10-CM

## 2022-10-09 DIAGNOSIS — R140 Abdominal distension (gaseous): Secondary | ICD-10-CM

## 2022-10-09 DIAGNOSIS — R109 Unspecified abdominal pain: Secondary | ICD-10-CM | POA: Diagnosis not present

## 2022-10-09 DIAGNOSIS — K317 Polyp of stomach and duodenum: Secondary | ICD-10-CM | POA: Diagnosis not present

## 2022-10-09 MED ORDER — SODIUM CHLORIDE 0.9 % IV SOLN: 500.0000 mL | Freq: Once | INTRAVENOUS | Status: DC

## 2022-10-09 NOTE — Progress Notes (Signed)
Report given to PACU, vss 

## 2022-10-09 NOTE — Progress Notes (Signed)
History and Physical Interval Note: seen in the office 8/16 - no interval changes. History of longstanding GERD, previously on nexium with significant breakthrough, switched to protonix. Also with significant upper abdominal bloating / discomfort - had recommended he stop carbonated beverages. EGD to further evaluate. Otherwise he feels well without complaints today. He does think protonix has helped his reflux since office visit.     10/09/2022 9:25 AM  Travis Shea  has presented today for endoscopic procedure(s), with the diagnosis of  Encounter Diagnoses  Name Primary?   Gastroesophageal reflux disease, unspecified whether esophagitis present Yes   Abdominal bloating    Abdominal discomfort   .  The various methods of evaluation and treatment have been discussed with the patient and/or family. After consideration of risks, benefits and other options for treatment, the patient has consented to  the endoscopic procedure(s).   The patient's history has been reviewed, patient examined, no change in status, stable for surgery.  I have reviewed the patient's chart and labs.  Questions were answered to the patient's satisfaction.    Harlin Rain, MD [facility]

## 2022-10-09 NOTE — Patient Instructions (Signed)
Discharge instructions given. Biopsies taken. Handout on Hiatal Hernia. Avoid Carbonated beverages. YOU HAD AN ENDOSCOPIC PROCEDURE TODAY AT THE [facility]:   Refer to the procedure report that was given to you for any specific questions about what was found during the examination.  If the procedure report does not answer your questions, please call your gastroenterologist to clarify.  If you requested that your care partner not be given the details of your procedure findings, then the procedure report has been included in a sealed envelope for you to review at your convenience later.  YOU SHOULD EXPECT: Some feelings of bloating in the abdomen. Passage of more gas than usual.  Walking can help get rid of the air that was put into your GI tract during the procedure and reduce the bloating. If you had a lower endoscopy (such as a colonoscopy or flexible sigmoidoscopy) you may notice spotting of blood in your stool or on the toilet paper. If you underwent a bowel prep for your procedure, you may not have a normal bowel movement for a few days.  Please Note:  You might notice some irritation and congestion in your nose or some drainage.  This is from the oxygen used during your procedure.  There is no need for concern and it should clear up in a day or so.  SYMPTOMS TO REPORT IMMEDIATELY:   Following upper endoscopy (EGD)  Vomiting of blood or coffee ground material  New chest pain or pain under the shoulder blades  Painful or persistently difficult swallowing  New shortness of breath  Fever of 100F or higher  Black, tarry-looking stools  For urgent or emergent issues, a gastroenterologist can be reached at any hour by calling [phone]. Do not use MyChart messaging for urgent concerns.    DIET:  We do recommend a small meal at first, but then you may proceed to your regular diet.  Drink plenty of fluids but you should avoid alcoholic beverages for 24 hours.  ACTIVITY:   You should plan to take it easy for the rest of today and you should NOT DRIVE or use heavy machinery until tomorrow (because of the sedation medicines used during the test).    FOLLOW UP: Our staff will call the number listed on your records the next business day following your procedure.  We will call around 7:15- 8:00 am to check on you and address any questions or concerns that you may have regarding the information given to you following your procedure. If we do not reach you, we will leave a message.     If any biopsies were taken you will be contacted by phone or by letter within the next 1-3 weeks.  Please call us at [phone] if you have not heard about the biopsies in 3 weeks.    SIGNATURES/CONFIDENTIALITY: You and/or your care partner have signed paperwork which will be entered into your electronic medical record.  These signatures attest to the fact that that the information above on your After Visit Summary has been reviewed and is understood.  Full responsibility of the confidentiality of this discharge information lies with you and/or your care-partner.

## 2022-10-09 NOTE — Op Note (Signed)
[facility] Patient Name: Travis Shea Procedure Date: 10/09/2022 9:19 AM MRN: [id number] Endoscopist: Viviann Spare P. Adela Lank , MD, [id number] Age: 67 Referring MD:  Date of Birth: 05/04/1955 Gender: Male Account #: [id number] Procedure:                Upper GI endoscopy Indications:              Screening for Barrett's esophagus - longstanding                            reflux on chronic PPI - recently nexium stopped                            working well, transitioned to protonix 40mg  twice                            daily with much better control of symptoms. Also                            has significant upper abdominal bloating /                            discomfort, does endorse drinking carbonated                            beverages and we discussed holding those for now Medicines:                Monitored Anesthesia Care Procedure:                Pre-Anesthesia Assessment:                           - Prior to the procedure, a History and Physical                            was performed, and patient medications and                            allergies were reviewed. The patient's tolerance of                            previous anesthesia was also reviewed. The risks                            and benefits of the procedure and the sedation                            options and risks were discussed with the patient.                            All questions were answered, and informed consent                            was obtained. Prior Anticoagulants: The patient has  taken no anticoagulant or antiplatelet agents. ASA                            Grade Assessment: II - A patient with mild systemic                            disease. After reviewing the risks and benefits,                            the patient was deemed in satisfactory condition to                            undergo the procedure.                           After  obtaining informed consent, the endoscope was                            passed under direct vision. Throughout the                            procedure, the patient's blood pressure, pulse, and                            oxygen saturations were monitored continuously. The                            GIF [id number] #[id number] was introduced through the                            mouth, and advanced to the second part of duodenum.                            The upper GI endoscopy was accomplished without                            difficulty. The patient tolerated the procedure                            well. Scope In: Scope Out: Findings:                 Esophagogastric landmarks were identified: the                            Z-line was found at 33 cm, the gastroesophageal                            junction was found at 36 cm and the upper extent of                            the gastric folds was found at 40 cm from the  incisors.                           A 4 cm hiatal hernia was present.                           There were esophageal mucosal changes classified as                            Barrett's stage C0-M3 per Prague criteria present                            in the lower third of the esophagus. The maximum                            longitudinal extent of these mucosal changes was 3                            cm in length. One long tongue roughly 3cm in length                            with smaller islands / extensions around the GEJ.                            Biopsies were taken with a cold forceps for                            histology.                           The exam of the esophagus was otherwise normal.                           A single small sessile polyp was found in the                            cardia. Appeared inflammatory in nature, biopsies                            were taken with a cold forceps for histology to                             ensure no adenomatous change. It was difficult to                            approach from the retroflexed position.                           There was residual secretions lining the stomach,                            requiring extensive lavage. The exam of the stomach  was otherwise normal.                           Biopsies were taken with a cold forceps for                            Helicobacter pylori testing.                           The examined duodenum was normal. Biopsies for                            histology were taken with a cold forceps for                            evaluation of celiac disease. Complications:            No immediate complications. Estimated blood loss:                            Minimal. Estimated Blood Loss:     Estimated blood loss was minimal. Impression:               - Esophagogastric landmarks identified.                           - 4 cm hiatal hernia.                           - Esophageal mucosal changes classified as                            Barrett's stage C0-M3 per Prague criteria. Biopsied.                           - Normal esophagus otherwise.                           - Benign appearing gastric polyp in the cardia.                            Biopsied.                           - Normal stomach otherwise - biopsies taken to rule                            out H pylori                           - Normal examined duodenum. Biopsied. Recommendation:           - Patient has a contact number available for                            emergencies. The signs and symptoms of potential  delayed complications were discussed with the                            patient. Return to normal activities tomorrow.                            Written discharge instructions were provided to the                            patient.                           - Resume previous diet.                            - Continue present medications.                           - Continue protonix twice daily for reflux if that                            has provided benefit since the office visit                           - Await pathology results.                           - Avoid carbonated beverages.                           - If symptoms persist and pathology results without                            clear cause, consideration for trial of empiric                            Reglan Danice Dippolito P. Stepen Prins, MD 10/09/2022 10:17:00 AM This report has been signed electronically.

## 2022-10-09 NOTE — Progress Notes (Signed)
0945 Robinul 0.1 mg IV given due large amount of secretions upon assessment.  MD made aware, vss 

## 2022-10-09 NOTE — Progress Notes (Signed)
Pt's states no medical or surgical changes since previsit or office visit. 

## 2022-10-10 ENCOUNTER — Ambulatory Visit: Payer: No Typology Code available for payment source | Admitting: Internal Medicine

## 2022-10-10 ENCOUNTER — Telehealth: Payer: Self-pay

## 2022-10-10 NOTE — Telephone Encounter (Signed)
Follow up call to pt, unable to leave message for pt

## 2022-10-12 ENCOUNTER — Other Ambulatory Visit (HOSPITAL_COMMUNITY): Payer: Self-pay

## 2022-10-12 ENCOUNTER — Other Ambulatory Visit: Payer: Self-pay

## 2022-10-12 MED ORDER — CLARITHROMYCIN 500 MG PO TABS
500.0000 mg | ORAL_TABLET | Freq: Two times a day (BID) | ORAL | 0 refills | Status: AC
  Filled 2022-10-12: qty 28, 14d supply, fill #0

## 2022-10-12 MED ORDER — AMOXICILLIN 500 MG PO CAPS
1000.0000 mg | ORAL_CAPSULE | Freq: Two times a day (BID) | ORAL | 0 refills | Status: AC
  Filled 2022-10-12: qty 56, 14d supply, fill #0

## 2022-10-12 MED ORDER — METRONIDAZOLE 500 MG PO TABS
500.0000 mg | ORAL_TABLET | Freq: Two times a day (BID) | ORAL | 0 refills | Status: AC
  Filled 2022-10-12: qty 28, 14d supply, fill #0

## 2022-10-18 ENCOUNTER — Other Ambulatory Visit (HOSPITAL_COMMUNITY): Payer: Self-pay

## 2022-10-26 ENCOUNTER — Telehealth: Payer: Self-pay

## 2022-10-26 NOTE — Telephone Encounter (Signed)
[facility] message sent to Travis Shea to please contact his PCP if he doesn't hear back soon from them in regards to his current dx of COVID.

## 2022-10-27 ENCOUNTER — Telehealth: Payer: Self-pay | Admitting: Internal Medicine

## 2022-10-27 NOTE — Telephone Encounter (Signed)
Please advise for patient

## 2022-10-27 NOTE — Telephone Encounter (Signed)
Called patient and LVM.

## 2022-10-27 NOTE — Telephone Encounter (Signed)
Hi Micaiah, This pt has sent a My Chart message requesting to have Hep A & B Vaccines.  Could you find out for me from Dr. Lawerance Bach if I can schedule that for him?  Thanks in advance!  [place]ana

## 2022-10-30 NOTE — Telephone Encounter (Signed)
Called patient and informed him that he can call our office to be scheduled for a nurse visit

## 2022-10-30 NOTE — Telephone Encounter (Signed)
He is in need for hep a and hep b (heplisav) so can schedule. I believe he was already scheduled to get at GI so as long as he wants to get here okay to do.

## 2022-11-01 ENCOUNTER — Telehealth: Payer: Self-pay | Admitting: Internal Medicine

## 2022-11-01 NOTE — Telephone Encounter (Signed)
Pt is inquiring about Hepatis A and B vaccines. Please advise.

## 2022-11-01 NOTE — Telephone Encounter (Signed)
Pt need to get schedule for a nurse visit. I have reached out to patient and lvm pertaining that information

## 2022-11-28 ENCOUNTER — Ambulatory Visit (INDEPENDENT_AMBULATORY_CARE_PROVIDER_SITE_OTHER): Payer: No Typology Code available for payment source

## 2022-11-28 ENCOUNTER — Other Ambulatory Visit (HOSPITAL_COMMUNITY): Payer: Self-pay

## 2022-11-28 ENCOUNTER — Ambulatory Visit: Payer: No Typology Code available for payment source | Admitting: Internal Medicine

## 2022-11-28 VITALS — BP 138/100 | HR 72 | Temp 97.9°F | Ht 68.0 in | Wt 212.0 lb

## 2022-11-28 DIAGNOSIS — R052 Subacute cough: Secondary | ICD-10-CM | POA: Diagnosis not present

## 2022-11-28 DIAGNOSIS — R5383 Other fatigue: Secondary | ICD-10-CM

## 2022-11-28 DIAGNOSIS — Z23 Encounter for immunization: Secondary | ICD-10-CM

## 2022-11-28 DIAGNOSIS — K219 Gastro-esophageal reflux disease without esophagitis: Secondary | ICD-10-CM | POA: Diagnosis not present

## 2022-11-28 DIAGNOSIS — Z8616 Personal history of COVID-19: Secondary | ICD-10-CM | POA: Diagnosis not present

## 2022-11-28 DIAGNOSIS — R062 Wheezing: Secondary | ICD-10-CM | POA: Diagnosis not present

## 2022-11-28 DIAGNOSIS — I10 Essential (primary) hypertension: Secondary | ICD-10-CM | POA: Diagnosis not present

## 2022-11-28 MED ORDER — OMEPRAZOLE 40 MG PO CPDR
40.0000 mg | DELAYED_RELEASE_CAPSULE | Freq: Every day | ORAL | 3 refills | Status: DC
Start: 2022-11-28 — End: 2023-05-29
  Filled 2022-11-28: qty 90, 90d supply, fill #0
  Filled 2023-02-12: qty 90, 90d supply, fill #1
  Filled 2023-04-13: qty 90, 90d supply, fill #2

## 2022-11-28 MED ORDER — ONDANSETRON 4 MG PO TBDP
4.0000 mg | ORAL_TABLET | Freq: Three times a day (TID) | ORAL | 6 refills | Status: DC | PRN
  Filled 2022-11-28: qty 60, 20d supply, fill #0

## 2022-11-28 NOTE — Patient Instructions (Signed)
We have sent in the zofran and the omeprazole.   We will check the chest x-ray.

## 2022-11-28 NOTE — Progress Notes (Unsigned)
   Subjective:   Patient ID: Travis Shea, male    DOB: 05/02/1955, 67 y.o.   MRN: [id number]  HPI The patient is a 67 YO man coming in for fatigue and new cough. Denies SOB but hard to motivate to move as much.   Review of Systems  Constitutional: Negative.   HENT: Negative.    Eyes: Negative.   Respiratory:  Negative for cough, chest tightness and shortness of breath.   Cardiovascular:  Negative for chest pain, palpitations and leg swelling.  Gastrointestinal:  Negative for abdominal distention, abdominal pain, constipation, diarrhea, nausea and vomiting.  Musculoskeletal: Negative.   Skin: Negative.   Neurological: Negative.   Psychiatric/Behavioral: Negative.      Objective:  Physical Exam Constitutional:      Appearance: He is well-developed.  HENT:     Head: Normocephalic and atraumatic.  Cardiovascular:     Rate and Rhythm: Normal rate and regular rhythm.  Pulmonary:     Effort: Pulmonary effort is normal. No respiratory distress.     Breath sounds: Normal breath sounds. No wheezing or rales.  Abdominal:     General: Bowel sounds are normal. There is no distension.     Palpations: Abdomen is soft.     Tenderness: There is no abdominal tenderness. There is no rebound.  Musculoskeletal:     Cervical back: Normal range of motion.  Skin:    General: Skin is warm and dry.  Neurological:     Mental Status: He is alert and oriented to person, place, and time.     Coordination: Coordination normal.     Vitals:   11/28/22 1558 11/28/22 1603  BP: (!) 138/100 (!) 138/100  Pulse: 72   Temp: 97.9 F (36.6 C)   TempSrc: Oral   SpO2: 98%   Weight: 212 lb (96.2 kg)   Height: 5\' 8"  (1.727 m)     Assessment & Plan:  Hep a and heplisav given at visit

## 2022-11-29 ENCOUNTER — Encounter: Payer: Self-pay | Admitting: Internal Medicine

## 2022-11-29 DIAGNOSIS — R052 Subacute cough: Secondary | ICD-10-CM | POA: Insufficient documentation

## 2022-11-29 LAB — CBC
HCT: 48.8 % (ref 39.0–52.0)
Hemoglobin: 15.9 g/dL (ref 13.0–17.0)
MCHC: 32.7 g/dL (ref 30.0–36.0)
MCV: 91.3 fL (ref 78.0–100.0)
Platelets: 346 10*3/uL (ref 150.0–400.0)
RBC: 5.34 Mil/uL (ref 4.22–5.81)
RDW: 15.1 % (ref 11.5–15.5)
WBC: 9.8 10*3/uL (ref 4.0–10.5)

## 2022-11-29 LAB — COMPREHENSIVE METABOLIC PANEL
ALT: 48 U/L (ref 0–53)
AST: 54 U/L — ABNORMAL HIGH (ref 0–37)
Albumin: 4.2 g/dL (ref 3.5–5.2)
Alkaline Phosphatase: 48 U/L (ref 39–117)
BUN: 11 mg/dL (ref 6–23)
CO2: 29 meq/L (ref 19–32)
Calcium: 9.9 mg/dL (ref 8.4–10.5)
Chloride: 101 meq/L (ref 96–112)
Creatinine, Ser: 1.38 mg/dL (ref 0.40–1.50)
GFR: 52.74 mL/min — ABNORMAL LOW (ref >60.00)
Glucose, Bld: 97 mg/dL (ref 70–99)
Potassium: 4.5 meq/L (ref 3.5–5.1)
Sodium: 135 meq/L (ref 135–145)
Total Bilirubin: 0.5 mg/dL (ref 0.2–1.2)
Total Protein: 7.3 g/dL (ref 6.0–8.3)

## 2022-11-29 LAB — VITAMIN D 25 HYDROXY (VIT D DEFICIENCY, FRACTURES): VITD: 44.46 ng/mL (ref 30.00–100.00)

## 2022-11-29 LAB — HEMOGLOBIN A1C: Hgb A1c MFr Bld: 6 % (ref 4.6–6.5)

## 2022-11-29 LAB — FERRITIN: Ferritin: 19.4 ng/mL — ABNORMAL LOW (ref 22.0–322.0)

## 2022-11-29 LAB — VITAMIN B12: Vitamin B-12: 262 pg/mL (ref 211–911)

## 2022-11-29 NOTE — Assessment & Plan Note (Signed)
New cough which is likely related to GERD. He is changing treatment for this. Checking CXR to rule out pneumonia as he had covid-19 about 1 month ago and could be more at risk for this.

## 2022-11-29 NOTE — Assessment & Plan Note (Signed)
BP is elevated today but not typically and was not elevated previously. He will continue lisinopril 10 mg daily for now and monitor at home. If high he will let us know.

## 2022-11-29 NOTE — Assessment & Plan Note (Signed)
Checking vitamin D, B12, CBC, CMP, ferritin, HgA1c to assess. Treat as appropriate.

## 2022-11-29 NOTE — Assessment & Plan Note (Signed)
Not doing well on meds prescribed by GI. Took wife's medication and did well. Will change to omeprazole 40 mg daily and refilled zofran which he uses when needed.

## 2022-12-01 ENCOUNTER — Telehealth: Payer: Self-pay | Admitting: Internal Medicine

## 2022-12-01 ENCOUNTER — Encounter: Payer: Self-pay | Admitting: Internal Medicine

## 2022-12-01 ENCOUNTER — Other Ambulatory Visit (HOSPITAL_COMMUNITY): Payer: Self-pay

## 2022-12-01 MED ORDER — DOXYCYCLINE HYCLATE 100 MG PO TABS
100.0000 mg | ORAL_TABLET | Freq: Two times a day (BID) | ORAL | 0 refills | Status: DC
  Filled 2022-12-01: qty 14, 7d supply, fill #0

## 2022-12-01 NOTE — Telephone Encounter (Signed)
Pt called wanting to speak with the nurse about his CT results.  Pt ask if she can respond back through mychart. Please advise.

## 2022-12-01 NOTE — Telephone Encounter (Signed)
I have sent patient a message

## 2022-12-01 NOTE — Telephone Encounter (Signed)
I do not see results just yet.

## 2022-12-14 ENCOUNTER — Ambulatory Visit: Payer: No Typology Code available for payment source | Admitting: Orthopaedic Surgery

## 2022-12-14 ENCOUNTER — Encounter: Payer: Self-pay | Admitting: Orthopaedic Surgery

## 2022-12-14 ENCOUNTER — Other Ambulatory Visit (INDEPENDENT_AMBULATORY_CARE_PROVIDER_SITE_OTHER): Payer: No Typology Code available for payment source

## 2022-12-14 DIAGNOSIS — G8929 Other chronic pain: Secondary | ICD-10-CM

## 2022-12-14 DIAGNOSIS — M25511 Pain in right shoulder: Secondary | ICD-10-CM

## 2022-12-14 MED ORDER — METHYLPREDNISOLONE ACETATE 40 MG/ML IJ SUSP
40.0000 mg | INTRAMUSCULAR | Status: AC | PRN
Start: 2022-12-14 — End: 2022-12-14
  Administered 2022-12-14: 40 mg via INTRA_ARTICULAR

## 2022-12-14 MED ORDER — LIDOCAINE HCL 1 % IJ SOLN
3.0000 mL | INTRAMUSCULAR | Status: AC | PRN
Start: 2022-12-14 — End: 2022-12-14
  Administered 2022-12-14: 3 mL

## 2022-12-14 NOTE — Progress Notes (Signed)
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  He does a lot of dead lifts and bench pressing as well as other weight training types of free weight exercises.  He has been dealing with right shoulder pain with decreased motion for several months now with no known injury.  He is not a diabetic.  He has not had an injection in the right shoulder before or shoulder surgery.  He is right-hand dominant.  On exam his right shoulder moves smoothly and fluidly except for external rotation is limited and internal rotation with adduction is also limited.  He can only reach to about his belt level when reaching behind him with his right shoulder.  He does show some positive signs of impingement and positive Neer and Hawkins signs.  3 views of the right shoulder still show well-maintained subacromial outlet with no significant arthritic findings.  I did recommend a steroid injection today in the subacromial outlet which she agreed to and tolerated well.  He definitely would benefit from outpatient physical therapy and from backing off his weightlifting regimen.  I would like to send him to outpatient physical therapy to see if they can improve his range of motion of that right shoulder.  I will then see him back in about 6 weeks for repeat exam.  He does have a shoulder that I would consider a MRI of that shoulder if he does not improve.  All question concerns were addressed and answered.  He agrees with this treatment plan.    Procedure Note  Patient: Travis Shea             Date of Birth: 07/27/1955           MRN: [id number]             Visit Date: 12/14/2022  Procedures: Visit Diagnoses:  1. Chronic right shoulder pain     Large Joint Inj: R subacromial bursa on 12/14/2022 5:04 PM Indications: pain and diagnostic evaluation Details: 22 G 1.5 in needle  Arthrogram: No  Medications:  3 mL lidocaine 1 %; 40 mg methylPREDNISolone acetate 40 MG/ML Outcome: tolerated well, no immediate complications Procedure, treatment alternatives, risks and benefits explained, specific risks discussed. Consent was given by the patient. Immediately prior to procedure a time out was called to verify the correct patient, procedure, equipment, support staff and site/side marked as required. Patient was prepped and draped in the usual sterile fashion.

## 2022-12-15 ENCOUNTER — Other Ambulatory Visit: Payer: Self-pay

## 2022-12-15 DIAGNOSIS — G8929 Other chronic pain: Secondary | ICD-10-CM

## 2022-12-17 ENCOUNTER — Emergency Department (HOSPITAL_COMMUNITY): Payer: No Typology Code available for payment source

## 2022-12-17 ENCOUNTER — Observation Stay (HOSPITAL_COMMUNITY)
Admission: EM | Admit: 2022-12-17 | Discharge: 2022-12-21 | Disposition: A | Discharge status: Home / Self Care | Payer: No Typology Code available for payment source | Attending: Student | Admitting: Student

## 2022-12-17 ENCOUNTER — Other Ambulatory Visit: Payer: Self-pay

## 2022-12-17 ENCOUNTER — Encounter (HOSPITAL_COMMUNITY): Payer: Self-pay

## 2022-12-17 DIAGNOSIS — I428 Other cardiomyopathies: Secondary | ICD-10-CM | POA: Insufficient documentation

## 2022-12-17 DIAGNOSIS — N289 Disorder of kidney and ureter, unspecified: Secondary | ICD-10-CM

## 2022-12-17 DIAGNOSIS — I2510 Atherosclerotic heart disease of native coronary artery without angina pectoris: Secondary | ICD-10-CM | POA: Diagnosis present

## 2022-12-17 DIAGNOSIS — Z79899 Other long term (current) drug therapy: Secondary | ICD-10-CM | POA: Insufficient documentation

## 2022-12-17 DIAGNOSIS — Z96651 Presence of right artificial knee joint: Secondary | ICD-10-CM | POA: Insufficient documentation

## 2022-12-17 DIAGNOSIS — R55 Syncope and collapse: Secondary | ICD-10-CM | POA: Diagnosis not present

## 2022-12-17 DIAGNOSIS — R7989 Other specified abnormal findings of blood chemistry: Secondary | ICD-10-CM | POA: Diagnosis not present

## 2022-12-17 DIAGNOSIS — E876 Hypokalemia: Secondary | ICD-10-CM | POA: Diagnosis present

## 2022-12-17 DIAGNOSIS — K219 Gastro-esophageal reflux disease without esophagitis: Secondary | ICD-10-CM | POA: Diagnosis present

## 2022-12-17 DIAGNOSIS — I509 Heart failure, unspecified: Secondary | ICD-10-CM | POA: Diagnosis not present

## 2022-12-17 DIAGNOSIS — I714 Abdominal aortic aneurysm, without rupture, unspecified: Secondary | ICD-10-CM | POA: Diagnosis present

## 2022-12-17 DIAGNOSIS — R42 Dizziness and giddiness: Secondary | ICD-10-CM | POA: Diagnosis not present

## 2022-12-17 DIAGNOSIS — M47812 Spondylosis without myelopathy or radiculopathy, cervical region: Secondary | ICD-10-CM | POA: Diagnosis not present

## 2022-12-17 DIAGNOSIS — N1831 Chronic kidney disease, stage 3a: Secondary | ICD-10-CM | POA: Diagnosis not present

## 2022-12-17 DIAGNOSIS — R0602 Shortness of breath: Secondary | ICD-10-CM | POA: Diagnosis not present

## 2022-12-17 DIAGNOSIS — E669 Obesity, unspecified: Secondary | ICD-10-CM | POA: Diagnosis present

## 2022-12-17 DIAGNOSIS — I517 Cardiomegaly: Secondary | ICD-10-CM | POA: Diagnosis present

## 2022-12-17 DIAGNOSIS — I5043 Acute on chronic combined systolic (congestive) and diastolic (congestive) heart failure: Secondary | ICD-10-CM

## 2022-12-17 DIAGNOSIS — I5042 Chronic combined systolic (congestive) and diastolic (congestive) heart failure: Secondary | ICD-10-CM

## 2022-12-17 DIAGNOSIS — R410 Disorientation, unspecified: Secondary | ICD-10-CM | POA: Diagnosis not present

## 2022-12-17 DIAGNOSIS — I130 Hypertensive heart and chronic kidney disease with heart failure and stage 1 through stage 4 chronic kidney disease, or unspecified chronic kidney disease: Secondary | ICD-10-CM | POA: Insufficient documentation

## 2022-12-17 DIAGNOSIS — I2699 Other pulmonary embolism without acute cor pulmonale: Secondary | ICD-10-CM | POA: Diagnosis not present

## 2022-12-17 DIAGNOSIS — I351 Nonrheumatic aortic (valve) insufficiency: Secondary | ICD-10-CM | POA: Insufficient documentation

## 2022-12-17 DIAGNOSIS — I499 Cardiac arrhythmia, unspecified: Secondary | ICD-10-CM | POA: Diagnosis not present

## 2022-12-17 DIAGNOSIS — R4781 Slurred speech: Secondary | ICD-10-CM | POA: Diagnosis not present

## 2022-12-17 DIAGNOSIS — R778 Other specified abnormalities of plasma proteins: Secondary | ICD-10-CM | POA: Diagnosis not present

## 2022-12-17 DIAGNOSIS — F1990 Other psychoactive substance use, unspecified, uncomplicated: Secondary | ICD-10-CM

## 2022-12-17 DIAGNOSIS — I7121 Aneurysm of the ascending aorta, without rupture: Secondary | ICD-10-CM | POA: Diagnosis not present

## 2022-12-17 DIAGNOSIS — I6782 Cerebral ischemia: Secondary | ICD-10-CM | POA: Diagnosis not present

## 2022-12-17 DIAGNOSIS — I10 Essential (primary) hypertension: Secondary | ICD-10-CM | POA: Diagnosis present

## 2022-12-17 DIAGNOSIS — E785 Hyperlipidemia, unspecified: Secondary | ICD-10-CM | POA: Diagnosis present

## 2022-12-17 LAB — BASIC METABOLIC PANEL
Anion gap: 11 (ref 5–15)
BUN: 12 mg/dL (ref 8–23)
CO2: 22 mmol/L (ref 22–32)
Calcium: 9.7 mg/dL (ref 8.9–10.3)
Chloride: 102 mmol/L (ref 98–111)
Creatinine, Ser: 1.52 mg/dL — ABNORMAL HIGH (ref 0.61–1.24)
GFR, Estimated: 50 mL/min — ABNORMAL LOW (ref >60)
Glucose, Bld: 133 mg/dL — ABNORMAL HIGH (ref 70–99)
Potassium: 3.4 mmol/L — ABNORMAL LOW (ref 3.5–5.1)
Sodium: 135 mmol/L (ref 135–145)

## 2022-12-17 LAB — CBC WITH DIFFERENTIAL/PLATELET
Abs Immature Granulocytes: 0.06 10*3/uL (ref 0.00–0.07)
Basophils Absolute: 0 10*3/uL (ref 0.0–0.1)
Basophils Relative: 0 %
Eosinophils Absolute: 0.1 10*3/uL (ref 0.0–0.5)
Eosinophils Relative: 1 %
HCT: 45.2 % (ref 39.0–52.0)
Hemoglobin: 15 g/dL (ref 13.0–17.0)
Immature Granulocytes: 1 %
Lymphocytes Relative: 24 %
Lymphs Abs: 2.1 10*3/uL (ref 0.7–4.0)
MCH: 29.4 pg (ref 26.0–34.0)
MCHC: 33.2 g/dL (ref 30.0–36.0)
MCV: 88.6 fL (ref 80.0–100.0)
Monocytes Absolute: 0.9 10*3/uL (ref 0.1–1.0)
Monocytes Relative: 10 %
Neutro Abs: 5.8 10*3/uL (ref 1.7–7.7)
Neutrophils Relative %: 64 %
Platelets: 309 10*3/uL (ref 150–400)
RBC: 5.1 MIL/uL (ref 4.22–5.81)
RDW: 14.6 % (ref 11.5–15.5)
WBC: 9 10*3/uL (ref 4.0–10.5)
nRBC: 0 % (ref 0.0–0.2)

## 2022-12-17 LAB — CBG MONITORING, ED: Glucose-Capillary: 145 mg/dL — ABNORMAL HIGH (ref 70–99)

## 2022-12-17 NOTE — ED Provider Notes (Signed)
[place] EMERGENCY DEPARTMENT AT [facility] Provider Note   CSN: [id number] Arrival date & time: 12/17/22  2211     History {Add pertinent medical, surgical, social history, OB history to HPI:1} Chief Complaint  Patient presents with  . Loss of Consciousness    Travis Shea is a 67 y.o. male.  Patient with past medical history significant for hypertension, GERD, recent pneumonia diagnosis treated with doxycycline presents to the emergency department via EMS complaining of a syncopal episode.  Patient states that he was sitting on the arm of a sofa watching TV when he felt lightheaded.  His wife reports hearing a thump and found the patient laying in the floor.  He was reportedly unconscious for a very brief period of time.  Upon awakening he was immediately coherent.  He has no complaints at this time but does feel that he may be dehydrated.  He states he worked outside yesterday more than usual.  He also feels short of breath consistent with his recent diagnosis of pneumonia with no change over the past few weeks.  He denies chest pain, abdominal pain, nausea, vomiting, urinary symptoms, headache, neck pain.  He denies any history of similar syncopal episodes.   Loss of Consciousness      Home Medications Prior to Admission medications   Medication Sig Start Date End Date Taking? Authorizing Provider  doxycycline (VIBRA-TABS) 100 MG tablet Take 1 tablet (100 mg total) by mouth 2 (two) times daily. 12/01/22   Myrlene Broker, MD  lisinopril (ZESTRIL) 10 MG tablet Take 1 tablet (10 mg total) by mouth daily. 09/11/22   Gwenlyn Fudge, FNP  omeprazole (PRILOSEC) 40 MG capsule Take 1 capsule (40 mg total) by mouth daily. 11/28/22   Myrlene Broker, MD  ondansetron (ZOFRAN-ODT) 4 MG disintegrating tablet Dissolve 1 tablet (4 mg total) in mouth every 8 (eight) hours as needed for nausea or vomiting. 11/28/22   Myrlene Broker, MD  sucralfate (CARAFATE) 1 g  tablet Take 1 tablet (1 g total) by mouth as needed. 09/29/22   Armbruster, Willaim Rayas, MD      Allergies    Morphine and codeine, Other, and Buprenorphine hcl    Review of Systems   Review of Systems  Cardiovascular:  Positive for syncope.    Physical Exam Updated Vital Signs BP 128/80 (BP Location: Right Arm)   Pulse 73   Temp 97.6 F (36.4 C) (Oral)   Resp 18   Ht 5\' 9"  (1.753 m)   Wt 95.3 kg   SpO2 98%   BMI 31.01 kg/m  Physical Exam Vitals and nursing note reviewed.  Constitutional:      General: He is not in acute distress.    Appearance: He is well-developed.  HENT:     Head: Normocephalic and atraumatic.  Eyes:     Conjunctiva/sclera: Conjunctivae normal.  Cardiovascular:     Rate and Rhythm: Normal rate and regular rhythm.  Pulmonary:     Effort: Pulmonary effort is normal. No respiratory distress.     Breath sounds: Normal breath sounds.  Abdominal:     Palpations: Abdomen is soft.     Tenderness: There is no abdominal tenderness.  Musculoskeletal:        General: No swelling.     Cervical back: Neck supple.     Right lower leg: No edema.     Left lower leg: No edema.  Skin:    General: Skin is warm and dry.  Capillary Refill: Capillary refill takes less than 2 seconds.  Neurological:     Mental Status: He is alert.  Psychiatric:        Mood and Affect: Mood normal.    ED Results / Procedures / Treatments   Labs (all labs ordered are listed, but only abnormal results are displayed) Labs Reviewed  CBG MONITORING, ED - Abnormal; Notable for the following components:      Result Value   Glucose-Capillary 145 (*)    All other components within normal limits  CBC WITH DIFFERENTIAL/PLATELET  BASIC METABOLIC PANEL  URINALYSIS, ROUTINE W REFLEX MICROSCOPIC    EKG None  Radiology No results found.  Procedures Procedures  {Document cardiac monitor, telemetry assessment procedure when appropriate:1}  Medications Ordered in ED Medications -  No data to display  ED Course/ Medical Decision Making/ A&P   {   Click here for ABCD2, HEART and other calculatorsREFRESH Note before signing :1}                              Medical Decision Making Amount and/or Complexity of Data Reviewed Labs: ordered. Radiology: ordered. ECG/medicine tests: ordered.   This patient presents to the ED for concern of syncope, this involves an extensive number of treatment options, and is a complaint that carries with it a high risk of complications and morbidity.  The differential diagnosis includes vasovagal response, dehydration, dysrhythmia, infection, metabolic abnormality, endocrine abnormality, intracranial abnormality, others   Co morbidities that complicate the patient evaluation  Recent pneumonia diagnosis, hypertension, GERD   Additional history obtained:  Additional history obtained from *** External records from outside source obtained and reviewed including ***   Lab Tests:  I Ordered, and personally interpreted labs.  The pertinent results include:  ***   Imaging Studies ordered:  I ordered imaging studies including ***  I independently visualized and interpreted imaging which showed *** I agree with the radiologist interpretation   Cardiac Monitoring: / EKG:  The patient was maintained on a cardiac monitor.  I personally viewed and interpreted the cardiac monitored which showed an underlying rhythm of: ***   Consultations Obtained:  I requested consultation with the ***,  and discussed lab and imaging findings as well as pertinent plan - they recommend: ***   Problem List / ED Course / Critical interventions / Medication management  *** I ordered medication including ***  for ***  Reevaluation of the patient after these medicines showed that the patient {resolved/improved/worsened:23923::"improved"} I have reviewed the patients home medicines and have made adjustments as needed   Social Determinants of  Health:  ***   Test / Admission - Considered:  ***   {Document critical care time when appropriate:1} {Document review of labs and clinical decision tools ie heart score, Chads2Vasc2 etc:1}  {Document your independent review of radiology images, and any outside records:1} {Document your discussion with family members, caretakers, and with consultants:1} {Document social determinants of health affecting pt's care:1} {Document your decision making why or why not admission, treatments were needed:1} Final Clinical Impression(s) / ED Diagnoses Final diagnoses:  None    Rx / DC Orders ED Discharge Orders     None

## 2022-12-17 NOTE — ED Triage Notes (Signed)
Pt arrives via EMS from home. EMS reports pt was sitting on the sofa, started feeling dizzy/faint then woke up on the floor. EMS reports pt's wife heard a thump and found pt prone on the floor. EMS reports initial BP with Fire was 100/72 then systolic dropped when he stood up to 60.

## 2022-12-18 ENCOUNTER — Emergency Department (HOSPITAL_COMMUNITY): Payer: No Typology Code available for payment source

## 2022-12-18 ENCOUNTER — Observation Stay (HOSPITAL_BASED_OUTPATIENT_CLINIC_OR_DEPARTMENT_OTHER): Payer: No Typology Code available for payment source

## 2022-12-18 ENCOUNTER — Encounter (HOSPITAL_COMMUNITY): Payer: Self-pay | Admitting: Internal Medicine

## 2022-12-18 DIAGNOSIS — K219 Gastro-esophageal reflux disease without esophagitis: Secondary | ICD-10-CM | POA: Diagnosis not present

## 2022-12-18 DIAGNOSIS — I714 Abdominal aortic aneurysm, without rupture, unspecified: Secondary | ICD-10-CM | POA: Diagnosis present

## 2022-12-18 DIAGNOSIS — N289 Disorder of kidney and ureter, unspecified: Secondary | ICD-10-CM | POA: Diagnosis not present

## 2022-12-18 DIAGNOSIS — M47812 Spondylosis without myelopathy or radiculopathy, cervical region: Secondary | ICD-10-CM | POA: Diagnosis not present

## 2022-12-18 DIAGNOSIS — I5021 Acute systolic (congestive) heart failure: Secondary | ICD-10-CM | POA: Diagnosis not present

## 2022-12-18 DIAGNOSIS — I2510 Atherosclerotic heart disease of native coronary artery without angina pectoris: Secondary | ICD-10-CM | POA: Diagnosis not present

## 2022-12-18 DIAGNOSIS — R55 Syncope and collapse: Secondary | ICD-10-CM | POA: Diagnosis not present

## 2022-12-18 DIAGNOSIS — F1990 Other psychoactive substance use, unspecified, uncomplicated: Secondary | ICD-10-CM | POA: Diagnosis not present

## 2022-12-18 DIAGNOSIS — I351 Nonrheumatic aortic (valve) insufficiency: Secondary | ICD-10-CM | POA: Diagnosis not present

## 2022-12-18 DIAGNOSIS — I10 Essential (primary) hypertension: Secondary | ICD-10-CM

## 2022-12-18 DIAGNOSIS — E669 Obesity, unspecified: Secondary | ICD-10-CM | POA: Diagnosis not present

## 2022-12-18 DIAGNOSIS — R7989 Other specified abnormal findings of blood chemistry: Secondary | ICD-10-CM | POA: Diagnosis present

## 2022-12-18 DIAGNOSIS — I2699 Other pulmonary embolism without acute cor pulmonale: Secondary | ICD-10-CM | POA: Diagnosis not present

## 2022-12-18 DIAGNOSIS — E876 Hypokalemia: Secondary | ICD-10-CM | POA: Diagnosis present

## 2022-12-18 DIAGNOSIS — E785 Hyperlipidemia, unspecified: Secondary | ICD-10-CM | POA: Diagnosis present

## 2022-12-18 DIAGNOSIS — I7121 Aneurysm of the ascending aorta, without rupture: Secondary | ICD-10-CM | POA: Diagnosis not present

## 2022-12-18 DIAGNOSIS — I517 Cardiomegaly: Secondary | ICD-10-CM | POA: Diagnosis present

## 2022-12-18 DIAGNOSIS — I6782 Cerebral ischemia: Secondary | ICD-10-CM | POA: Diagnosis not present

## 2022-12-18 DIAGNOSIS — I429 Cardiomyopathy, unspecified: Secondary | ICD-10-CM | POA: Diagnosis not present

## 2022-12-18 LAB — CBC WITH DIFFERENTIAL/PLATELET
Abs Immature Granulocytes: 0.04 10*3/uL (ref 0.00–0.07)
Basophils Absolute: 0.1 10*3/uL (ref 0.0–0.1)
Basophils Relative: 1 %
Eosinophils Absolute: 0.1 10*3/uL (ref 0.0–0.5)
Eosinophils Relative: 1 %
HCT: 47 % (ref 39.0–52.0)
Hemoglobin: 15.1 g/dL (ref 13.0–17.0)
Immature Granulocytes: 0 %
Lymphocytes Relative: 25 %
Lymphs Abs: 2.3 10*3/uL (ref 0.7–4.0)
MCH: 29.9 pg (ref 26.0–34.0)
MCHC: 32.1 g/dL (ref 30.0–36.0)
MCV: 93.1 fL (ref 80.0–100.0)
Monocytes Absolute: 0.9 10*3/uL (ref 0.1–1.0)
Monocytes Relative: 10 %
Neutro Abs: 5.9 10*3/uL (ref 1.7–7.7)
Neutrophils Relative %: 63 %
Platelets: 250 10*3/uL (ref 150–400)
RBC: 5.05 MIL/uL (ref 4.22–5.81)
RDW: 15.2 % (ref 11.5–15.5)
WBC: 9.3 10*3/uL (ref 4.0–10.5)
nRBC: 0 % (ref 0.0–0.2)

## 2022-12-18 LAB — URINALYSIS, ROUTINE W REFLEX MICROSCOPIC
Bilirubin Urine: NEGATIVE
Glucose, UA: NEGATIVE mg/dL
Hgb urine dipstick: NEGATIVE
Ketones, ur: NEGATIVE mg/dL
Leukocytes,Ua: NEGATIVE
Nitrite: NEGATIVE
Protein, ur: NEGATIVE mg/dL
Specific Gravity, Urine: 1.024 (ref 1.005–1.030)
pH: 5 (ref 5.0–8.0)

## 2022-12-18 LAB — COMPREHENSIVE METABOLIC PANEL
ALT: 51 U/L — ABNORMAL HIGH (ref 0–44)
AST: 51 U/L — ABNORMAL HIGH (ref 15–41)
Albumin: 3.3 g/dL — ABNORMAL LOW (ref 3.5–5.0)
Alkaline Phosphatase: 36 U/L — ABNORMAL LOW (ref 38–126)
Anion gap: 8 (ref 5–15)
BUN: 12 mg/dL (ref 8–23)
CO2: 23 mmol/L (ref 22–32)
Calcium: 9.5 mg/dL (ref 8.9–10.3)
Chloride: 104 mmol/L (ref 98–111)
Creatinine, Ser: 1.52 mg/dL — ABNORMAL HIGH (ref 0.61–1.24)
GFR, Estimated: 50 mL/min — ABNORMAL LOW (ref >60)
Glucose, Bld: 115 mg/dL — ABNORMAL HIGH (ref 70–99)
Potassium: 4 mmol/L (ref 3.5–5.1)
Sodium: 135 mmol/L (ref 135–145)
Total Bilirubin: 0.9 mg/dL (ref <1.2)
Total Protein: 6.3 g/dL — ABNORMAL LOW (ref 6.5–8.1)

## 2022-12-18 LAB — ECHOCARDIOGRAM COMPLETE
AR max vel: 1.17 cm2
AV Area VTI: 1.25 cm2
AV Area mean vel: 1.07 cm2
AV Mean grad: 12 mm[Hg]
AV Peak grad: 21.9 mm[Hg]
Ao pk vel: 2.34 m/s
Area-P 1/2: 3.72 cm2
Calc EF: 39.5 %
Height: 69 in
P 1/2 time: 533 ms
S' Lateral: 4.9 cm
Single Plane A2C EF: 34.8 %
Single Plane A4C EF: 43.5 %
Weight: 3360 [oz_av]

## 2022-12-18 LAB — BRAIN NATRIURETIC PEPTIDE: B Natriuretic Peptide: 75.7 pg/mL (ref 0.0–100.0)

## 2022-12-18 LAB — TROPONIN I (HIGH SENSITIVITY)
Troponin I (High Sensitivity): 108 ng/L (ref <18)
Troponin I (High Sensitivity): 97 ng/L — ABNORMAL HIGH (ref <18)

## 2022-12-18 LAB — MAGNESIUM: Magnesium: 2 mg/dL (ref 1.7–2.4)

## 2022-12-18 LAB — TSH: TSH: 1.87 u[IU]/mL (ref 0.350–4.500)

## 2022-12-18 MED ORDER — KETOROLAC TROMETHAMINE 15 MG/ML IJ SOLN
15.0000 mg | Freq: Once | INTRAMUSCULAR | Status: DC
  Filled 2022-12-18: qty 1

## 2022-12-18 MED ORDER — LORAZEPAM 2 MG/ML IJ SOLN: 1.0000 mg | INTRAMUSCULAR | Status: AC | PRN

## 2022-12-18 MED ORDER — SODIUM CHLORIDE 0.9 % IV SOLN: INTRAVENOUS | Status: AC

## 2022-12-18 MED ORDER — DAPAGLIFLOZIN PROPANEDIOL 10 MG PO TABS
10.0000 mg | ORAL_TABLET | Freq: Every day | ORAL | Status: DC
  Administered 2022-12-18 – 2022-12-21 (×4): 10 mg via ORAL
  Filled 2022-12-18 (×4): qty 1

## 2022-12-18 MED ORDER — HYDRALAZINE HCL 20 MG/ML IJ SOLN: 10.0000 mg | INTRAMUSCULAR | Status: DC | PRN

## 2022-12-18 MED ORDER — ALBUTEROL SULFATE (2.5 MG/3ML) 0.083% IN NEBU: 2.5000 mg | INHALATION_SOLUTION | Freq: Four times a day (QID) | RESPIRATORY_TRACT | Status: DC | PRN

## 2022-12-18 MED ORDER — POTASSIUM CHLORIDE CRYS ER 20 MEQ PO TBCR
40.0000 meq | EXTENDED_RELEASE_TABLET | Freq: Once | ORAL | Status: AC
  Administered 2022-12-18: 40 meq via ORAL
  Filled 2022-12-18: qty 2

## 2022-12-18 MED ORDER — ONDANSETRON HCL 4 MG/2ML IJ SOLN: 4.0000 mg | Freq: Four times a day (QID) | INTRAMUSCULAR | Status: DC | PRN

## 2022-12-18 MED ORDER — ADULT MULTIVITAMIN W/MINERALS CH
1.0000 | ORAL_TABLET | Freq: Every day | ORAL | Status: DC
  Administered 2022-12-18 – 2022-12-21 (×3): 1 via ORAL
  Filled 2022-12-18 (×3): qty 1

## 2022-12-18 MED ORDER — PROCHLORPERAZINE EDISYLATE 10 MG/2ML IJ SOLN
10.0000 mg | Freq: Once | INTRAMUSCULAR | Status: AC
  Administered 2022-12-18: 10 mg via INTRAVENOUS
  Filled 2022-12-18: qty 2

## 2022-12-18 MED ORDER — ZOLPIDEM TARTRATE 5 MG PO TABS
5.0000 mg | ORAL_TABLET | Freq: Every evening | ORAL | Status: DC | PRN
  Administered 2022-12-19 – 2022-12-20 (×2): 5 mg via ORAL
  Filled 2022-12-18 (×2): qty 1

## 2022-12-18 MED ORDER — THIAMINE MONONITRATE 100 MG PO TABS
100.0000 mg | ORAL_TABLET | Freq: Every day | ORAL | Status: DC
  Administered 2022-12-18 – 2022-12-21 (×4): 100 mg via ORAL
  Filled 2022-12-18 (×4): qty 1

## 2022-12-18 MED ORDER — FOLIC ACID 1 MG PO TABS
1.0000 mg | ORAL_TABLET | Freq: Every day | ORAL | Status: DC
  Administered 2022-12-18 – 2022-12-21 (×4): 1 mg via ORAL
  Filled 2022-12-18 (×4): qty 1

## 2022-12-18 MED ORDER — KETOROLAC TROMETHAMINE 15 MG/ML IJ SOLN
15.0000 mg | Freq: Once | INTRAMUSCULAR | Status: AC
  Administered 2022-12-18: 15 mg via INTRAVENOUS

## 2022-12-18 MED ORDER — LORAZEPAM 1 MG PO TABS: 1.0000 mg | ORAL_TABLET | ORAL | Status: AC | PRN

## 2022-12-18 MED ORDER — THIAMINE HCL 100 MG/ML IJ SOLN: 100.0000 mg | Freq: Every day | INTRAMUSCULAR | Status: DC

## 2022-12-18 MED ORDER — ENOXAPARIN SODIUM 40 MG/0.4ML IJ SOSY
40.0000 mg | PREFILLED_SYRINGE | Freq: Every day | INTRAMUSCULAR | Status: DC
  Administered 2022-12-18 – 2022-12-20 (×3): 40 mg via SUBCUTANEOUS
  Filled 2022-12-18 (×3): qty 0.4

## 2022-12-18 MED ORDER — ACETAMINOPHEN 500 MG PO TABS
1000.0000 mg | ORAL_TABLET | Freq: Once | ORAL | Status: AC
  Administered 2022-12-18: 1000 mg via ORAL
  Filled 2022-12-18: qty 2

## 2022-12-18 MED ORDER — IOHEXOL 350 MG/ML SOLN
75.0000 mL | Freq: Once | INTRAVENOUS | Status: AC | PRN
  Administered 2022-12-18: 75 mL via INTRAVENOUS

## 2022-12-18 MED ORDER — ACETAMINOPHEN 650 MG RE SUPP: 650.0000 mg | Freq: Four times a day (QID) | RECTAL | Status: DC | PRN

## 2022-12-18 MED ORDER — SODIUM CHLORIDE 0.9% FLUSH
3.0000 mL | Freq: Two times a day (BID) | INTRAVENOUS | Status: DC
  Administered 2022-12-18 – 2022-12-21 (×7): 3 mL via INTRAVENOUS

## 2022-12-18 MED ORDER — PANTOPRAZOLE SODIUM 40 MG PO TBEC
40.0000 mg | DELAYED_RELEASE_TABLET | Freq: Every day | ORAL | Status: DC
  Administered 2022-12-18 – 2022-12-21 (×4): 40 mg via ORAL
  Filled 2022-12-18 (×4): qty 1

## 2022-12-18 MED ORDER — SACUBITRIL-VALSARTAN 24-26 MG PO TABS
1.0000 | ORAL_TABLET | Freq: Two times a day (BID) | ORAL | Status: DC
  Administered 2022-12-18: 1 via ORAL
  Filled 2022-12-18: qty 1

## 2022-12-18 MED ORDER — MELATONIN 3 MG PO TABS
3.0000 mg | ORAL_TABLET | Freq: Every evening | ORAL | Status: DC | PRN
  Administered 2022-12-18 – 2022-12-19 (×2): 3 mg via ORAL
  Filled 2022-12-18 (×2): qty 1

## 2022-12-18 MED ORDER — ACETAMINOPHEN 325 MG PO TABS: 650.0000 mg | ORAL_TABLET | Freq: Four times a day (QID) | ORAL | Status: DC | PRN

## 2022-12-18 NOTE — H&P (Addendum)
History and Physical    Patient: Travis Shea ION:[id number] DOB: [birth year] DOA: 12/17/2022 DOS: the patient was seen and examined on 12/18/2022 PCP: Myrlene Broker, MD  Patient coming from: Home via EMS  Chief Complaint:  Chief Complaint  Patient presents with   Loss of Consciousness   HPI: Travis Shea is a 67 y.o. male with medical history significant of hypertension, GERD who presents after having a syncopal episode.  Patient recalls sitting on the couch watching television around 9 PM yesterday evening when he began feeling dizzy/lightheaded.  The next thing he recalls is waking up on the floor.  He did hit his head on the floor.  His wife was in the kitchen and heard a thud and came into check on him finding him on the floor. She reports was placed there was no  seizure-like activity reported.  Denied having any significant fever, chills, chest pain, palpitations,  cough, nausea, vomiting,  diarrhea, leg swelling, or calf pain.  Patient notes that the day prior he had been doing yard work outside for about 3 hours, but denied it being particularly hot outside and try to keep him self hydrated.  Yesterday, he had eaten breakfast and gone to church and thereafter had a sandwich for lunch prior to grilling food that evening.  They had been getting ready to eat dinner when the event occurred.  He denies any personal or family history of heart issues to his knowledge.  For the last year patient reports that he has been more fatigued that he had attributed possibly to age.  His primary care provider had checked thyroid levels which were within normal limits back in April.  Over the last couple months has been intermittently having dizzy spells where he feels like he could pass out, but had not passed out before.  Patient notes that he had a colonoscopy May and postprocedure had reported complaints of dizziness.  Patient reports that he had COVID about 2 months ago and had been treated  thereafter for pneumonia last month.  He had been having some shortness of breath and feeling intermittently wheezy.  Denies any history of smoking cigarettes.  At baseline patient is active noting he works out for at least hour and fifteen minutes 3-4 times per week.  Patient does admit to drinking 1 or 2 mixed drinks of whiskey and bourbon couple days during the week.  He does make note of prior use of cocaine and marijuana, but did not want to discuss further detail.  In the emergency department patient was noted to be afebrile with otherwise stable vital signs.  Orthostatic vital signs were also obtained and negative.  Labs from 11/3 noted CBC with differential within normal limits, potassium 3.4, BUN 12, creatinine 1.52, and high-sensitivity troponin 108->97.  Chest x-ray and  urinalysis noted no acute abnormality.  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  CTA of the head and cervical spine was also obtained which noted no acute intracranial abnormality and small vessel ischemic changes.  Abdominal ultrasound was also obtained which noted patient had been given Tylenol 1000 mg p.o., ketorolac 15 mg IV, Compazine, and 40 meq of potassium chloride p.o.   cardiology had been notified   Review of Systems: As mentioned in the history of present illness. All other systems reviewed and are negative. Past Medical History:  Diagnosis Date   GERD (  gastroesophageal reflux disease)    Hypertension    Left knee injury    Past Surgical History:  Procedure Laterality Date   COLONOSCOPY     FOOT FRACTURE SURGERY Right    KNEE SURGERY     TOTAL HIP ARTHROPLASTY Right    Social History:  reports that he has never smoked. He has never used smokeless tobacco. He reports that he does not drink alcohol and does not use drugs.  Allergies  Allergen Reactions    Morphine And Codeine Nausea And Vomiting   Other Nausea And Vomiting   Buprenorphine Hcl Nausea And Vomiting    Family History  Problem Relation Age of Onset   Dementia Mother    Lung cancer Father    Other Sister        Lyme's disease   Diabetes Maternal Grandmother    Colon cancer Neg Hx    Colon polyps Neg Hx    Rectal cancer Neg Hx    Stomach cancer Neg Hx     Prior to Admission medications   Medication Sig Start Date End Date Taking? Authorizing Provider  acetaminophen (TYLENOL) 325 MG tablet Take 650 mg by mouth daily as needed for mild pain (pain score 1-3) (Shoulder arthritis).   Yes [provider]  Cyanocobalamin 500 MCG LOZG Take 500 mcg by mouth daily.   Yes [provider]  Multiple Vitamins-Minerals (MULTIVITAMIN WITH MINERALS) tablet Take 1 tablet by mouth daily. Mens Centrum   Yes [provider]  omeprazole (PRILOSEC) 40 MG capsule Take 1 capsule (40 mg total) by mouth daily. 11/28/22  Yes Myrlene Broker, MD  ondansetron (ZOFRAN-ODT) 4 MG disintegrating tablet Dissolve 1 tablet (4 mg total) in mouth every 8 (eight) hours as needed for nausea or vomiting. 11/28/22  Yes Myrlene Broker, MD    Physical Exam: Vitals:   12/18/22 0415 12/18/22 0430 12/18/22 0615 12/18/22 0630  BP: 128/82 127/76 134/87 135/80  Pulse: 76 73 73 66  Resp: 12 15 13 11   Temp:      TempSrc:      SpO2: 95% 95% 98% 97%  Weight:      Height:       Constitutional: Older adult male NAD, calm, comfortable Eyes: PERRL, lids and conjunctivae normal ENMT: Mucous membranes are moist.  Fair dentition. Neck: normal, supple, no JVD appreciated Respiratory: clear to auscultation bilaterally, no wheezing, no crackles. Normal respiratory effort. No accessory muscle use.  Cardiovascular: Regular rate and rhythm,  2/6 murmurs. No extremity edema. 2+ pedal pulses.  Abdomen: no tenderness, no masses palpated. Bowel sounds positive.  Musculoskeletal: no clubbing /  cyanosis. No joint deformity upper and lower extremities. Good ROM, no contractures. Normal muscle tone.  Skin: no rashes, lesions, ulcers.  Mild skin tenting noted. Neurologic: CN 2-12 grossly intact.   Strength 5/5 in all 4.  Psychiatric: Normal judgment and insight. Alert and oriented x 3. Normal mood.   Data Reviewed:  EKG revealed normal sinus rhythm with abnormal repolarization and ST elevation noted in the anterior leads.  Reviewed labs, imaging, and pertinent records as documented  Assessment and Plan:  Syncope Acute.  Patient reported sitting on the sofa and started feeling dizzy/faint prior to waking up on the floor.  CTA of the chest did not note any signs of a pulmonary embolism.  Question possibility of arrhythmia. -Admit to a telemetry bed -Check TSH -Check echocardiogram -Follow-up telemetry. Patient may warrant holter monitor in the outpatient setting. -Patient advised to  use caution when using heavy equipment or power tools. Avoid working on ladders or at heights. Take showers instead of baths. Ensure the water temperature is not too high on the home water heater. Do not go swimming alone. Do not lock yourself in a room alone (i.e. bathroom). When caring for infants or small children, sit down when holding, feeding, or changing them to minimize risk of injury to the child in the event you have a seizure. Maintain good sleep hygiene.  Also recommended avoiding alcohol  Elevated troponin Coronary artery calcification Acute.  Initial high-sensitivity troponin 108->97.  EKG noted some ST wave changes in the anterior.  CTA of the chest noted moderate coronary artery calcification. -Follow-up echocardiogram -Cardiology consulted, will follow-up for any further recommendations  Essential hypertension On admission blood pressures elevated up to 147/89.  Patient is not on any blood pressure medications currently.  It appears he had been prescribed lisinopril 10 mg earlier this year,  but did not get the prescription. -Hydralazine IV as needed for elevated blood pressures  Hypokalemia Acute.  Potassium mildly low at 3.4.  Patient was given 40 meq of potassium chloride p.o. -Follow-up magnesium -Continue to monitor and replace as needed  Cardiomegaly On CTA of the chest mild global cardiomegaly. -Follow-up echocardiogram  Renal insufficiency Creatinine elevated up to 1.52 with BUN 12.  Baseline creatinine previously had been 1.38 last month.  This is not more than a 0.3 increased from prior to suggest acute kidney injury at this time.  Urinalysis noted specific gravity of 1.024 which is at the upper limit of normal to suggest possible dehydration. -Normal saline IV fluids at 100 mL/h -Check BMP in a.m.  AAA Patient noted to have a 4.3 cm ascending thoracic aortic aneurysm on CT. 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 -Recommend annual screening of the ascending thoracic in outpatient setting -Recommend 3-year screening noted for the mid abdominal aneurysm  Elevated liver function test Acute.  Labs noted mild elevation in AST and ALT.  Patient had negative hepatitis screening done on 8/16. -Continue to monitor  Dyslipidemia Labs from 4/11 noted total cholesterol 148, HDL  25.7, LDL 95, triglycerides 236.  Patient not on medication for treatment at this time. -Consider starting patient on statin  GERD -Continue pharmacy substitution of Protonix for omeprazole  Polysubstance use Patient does report use of alcohol drinking mixed drinks of bourbon or whiskey occasionally, but also report prior history of cocaine and marijuana. -CIWA protocols initiated with as needed Ativan  Obesity BMI 31.01 kg/m.    DVT prophylaxis: Lovenox Advance Care Planning:   Code Status: Full Code    Consults: Cardiology  Family Communication: Wife updated over the phone  Severity of Illness: The appropriate patient status for this patient is  OBSERVATION. Observation status is judged to be reasonable and necessary in order to provide the required intensity of service to ensure the patient's safety. The patient's presenting symptoms, physical exam findings, and initial radiographic and laboratory data in the context of their medical condition is felt to place them at decreased risk for further clinical deterioration. Furthermore, it is anticipated that the patient will be medically stable for discharge from the hospital within 2 midnights of admission.   Author: Clydie Braun, MD 12/18/2022 7:05 AM  For on call review [email or web address].

## 2022-12-18 NOTE — ED Notes (Signed)
Patient transported to CT 

## 2022-12-18 NOTE — ED Notes (Signed)
ED TO INPATIENT HANDOFF REPORT  ED Nurse Name and Phone #: Marcelino Duster [phone]  S Name/Age/Gender Travis Shea 67 y.o. male Room/Bed: 037C/037C  Code Status   Code Status: Full Code  Home/SNF/Other Home Patient oriented to: self, place, time, and situation Is this baseline? Yes   Triage Complete: Triage complete  Chief Complaint Syncope [R55] Syncope and collapse [R55]  Triage Note Pt arrives via EMS from home. EMS reports pt was sitting on the sofa, started feeling dizzy/faint then woke up on the floor. EMS reports pt's wife heard a thump and found pt prone on the floor. EMS reports initial BP with Fire was 100/72 then systolic dropped when he stood up to 60.   Allergies Allergies  Allergen Reactions   Morphine And Codeine Nausea And Vomiting   Other Nausea And Vomiting   Buprenorphine Hcl Nausea And Vomiting    Level of Care/Admitting Diagnosis ED Disposition     ED Disposition  Admit   Condition  --   Comment  Hospital Area: [facility] [[id number]]  Level of Care: Telemetry Cardiac [103]  May place patient in observation at [facility] or [facility] if equivalent level of care is available:: No  Covid Evaluation: Asymptomatic - no recent exposure (last 10 days) testing not required  Diagnosis: Syncope and collapse [780.2.ICD-9-CM]  Admitting Physician: Clydie Braun [[id number]]  Attending Physician: Clydie Braun [[id number]]          B Medical/Surgery History Past Medical History:  Diagnosis Date   GERD (gastroesophageal reflux disease)    Hypertension    Left knee injury    Past Surgical History:  Procedure Laterality Date   COLONOSCOPY     FOOT FRACTURE SURGERY Right    KNEE SURGERY     TOTAL HIP ARTHROPLASTY Right      A IV Location/Drains/Wounds Patient Lines/Drains/Airways Status     Active Line/Drains/Airways     Name Placement date Placement time Site Days   Peripheral IV 12/17/22 18 G 1" Anterior;Distal;Left;Upper  Arm 12/17/22  2205  Arm  1            Intake/Output Last 24 hours No intake or output data in the 24 hours ending 12/18/22 1527  Labs/Imaging Results for orders placed or performed during the hospital encounter of 12/17/22 (from the past 48 hour(s))  Basic metabolic panel     Status: Abnormal   Collection Time: 12/17/22 10:26 PM  Result Value Ref Range   Sodium 135 135 - 145 mmol/L   Potassium 3.4 (L) 3.5 - 5.1 mmol/L   Chloride 102 98 - 111 mmol/L   CO2 22 22 - 32 mmol/L   Glucose, Bld 133 (H) 70 - 99 mg/dL    Comment: Glucose reference range applies only to samples taken after fasting for at least 8 hours.   BUN 12 8 - 23 mg/dL   Creatinine, Ser 6.21 (H) 0.61 - 1.24 mg/dL   Calcium 9.7 8.9 - 30.8 mg/dL   GFR, Estimated 50 (L) >60 mL/min    Comment: (NOTE) Calculated using the CKD-EPI Creatinine Equation (2021)    Anion gap 11 5 - 15    Comment: Performed at [facility], [address] [place], [place] [address]  CBC WITH DIFFERENTIAL     Status: None   Collection Time: 12/17/22 10:26 PM  Result Value Ref Range   WBC 9.0 4.0 - 10.5 K/uL   RBC 5.10 4.22 - 5.81 MIL/uL   Hemoglobin 15.0  13.0 - 17.0 g/dL   HCT 40.9 81.1 - 91.4 %   MCV 88.6 80.0 - 100.0 fL   MCH 29.4 26.0 - 34.0 pg   MCHC 33.2 30.0 - 36.0 g/dL   RDW 78.2 95.6 - 21.3 %   Platelets 309 150 - 400 K/uL   nRBC 0.0 0.0 - 0.2 %   Neutrophils Relative % 64 %   Neutro Abs 5.8 1.7 - 7.7 K/uL   Lymphocytes Relative 24 %   Lymphs Abs 2.1 0.7 - 4.0 K/uL   Monocytes Relative 10 %   Monocytes Absolute 0.9 0.1 - 1.0 K/uL   Eosinophils Relative 1 %   Eosinophils Absolute 0.1 0.0 - 0.5 K/uL   Basophils Relative 0 %   Basophils Absolute 0.0 0.0 - 0.1 K/uL   Immature Granulocytes 1 %   Abs Immature Granulocytes 0.06 0.00 - 0.07 K/uL    Comment: Performed at [facility], [address], [place], [place] [address]  CBG monitoring, ED     Status: Abnormal   Collection Time: 12/17/22 10:34 PM  Result  Value Ref Range   Glucose-Capillary 145 (H) 70 - 99 mg/dL    Comment: Glucose reference range applies only to samples taken after fasting for at least 8 hours.  Troponin I (High Sensitivity)     Status: Abnormal   Collection Time: 12/17/22 11:00 PM  Result Value Ref Range   Troponin I (High Sensitivity) 108 (HH) <18 ng/L    Comment: CRITICAL RESULT CALLED TO, READ BACK BY AND VERIFIED WITH EZELL,W RN 0040 12/18/22 AMIREHSANI F (NOTE) Elevated high sensitivity troponin I (hsTnI) values and significant  changes across serial measurements may suggest ACS but many other  chronic and acute conditions are known to elevate hsTnI results.  Refer to the "Links" section for chest pain algorithms and additional  guidance. Performed at [facility], [address], [place], [place] [address]   Urinalysis, Routine w reflex microscopic -Urine, Clean Catch     Status: Abnormal   Collection Time: 12/18/22 12:27 AM  Result Value Ref Range   Color, Urine YELLOW YELLOW   APPearance HAZY (A) CLEAR   Specific Gravity, Urine 1.024 1.005 - 1.030   pH 5.0 5.0 - 8.0   Glucose, UA NEGATIVE NEGATIVE mg/dL   Hgb urine dipstick NEGATIVE NEGATIVE   Bilirubin Urine NEGATIVE NEGATIVE   Ketones, ur NEGATIVE NEGATIVE mg/dL   Protein, ur NEGATIVE NEGATIVE mg/dL   Nitrite NEGATIVE NEGATIVE   Leukocytes,Ua NEGATIVE NEGATIVE    Comment: Performed at [facility], [address], [place], [place] [address]  Troponin I (High Sensitivity)     Status: Abnormal   Collection Time: 12/18/22  1:44 AM  Result Value Ref Range   Troponin I (High Sensitivity) 97 (H) <18 ng/L    Comment: (NOTE) Elevated high sensitivity troponin I (hsTnI) values and significant  changes across serial measurements may suggest ACS but many other  chronic and acute conditions are known to elevate hsTnI results.  Refer to the "Links" section for chest pain algorithms and additional  guidance. Performed at [facility], [address] [place], [place] [address]   Magnesium     Status: None   Collection Time: 12/18/22  6:44 AM  Result Value Ref Range   Magnesium 2.0 1.7 - 2.4 mg/dL    Comment: Performed at [facility], [address], [place], [place] [address]  CBC with Differential/Platelet  Status: None   Collection Time: 12/18/22  6:44 AM  Result Value Ref Range   WBC 9.3 4.0 - 10.5 K/uL   RBC 5.05 4.22 - 5.81 MIL/uL   Hemoglobin 15.1 13.0 - 17.0 g/dL   HCT 40.9 81.1 - 91.4 %   MCV 93.1 80.0 - 100.0 fL   MCH 29.9 26.0 - 34.0 pg   MCHC 32.1 30.0 - 36.0 g/dL   RDW 78.2 95.6 - 21.3 %   Platelets 250 150 - 400 K/uL   nRBC 0.0 0.0 - 0.2 %   Neutrophils Relative % 63 %   Neutro Abs 5.9 1.7 - 7.7 K/uL   Lymphocytes Relative 25 %   Lymphs Abs 2.3 0.7 - 4.0 K/uL   Monocytes Relative 10 %   Monocytes Absolute 0.9 0.1 - 1.0 K/uL   Eosinophils Relative 1 %   Eosinophils Absolute 0.1 0.0 - 0.5 K/uL   Basophils Relative 1 %   Basophils Absolute 0.1 0.0 - 0.1 K/uL   Immature Granulocytes 0 %   Abs Immature Granulocytes 0.04 0.00 - 0.07 K/uL    Comment: Performed at [facility], [address] [place], [place] [address]  Comprehensive metabolic panel     Status: Abnormal   Collection Time: 12/18/22  6:44 AM  Result Value Ref Range   Sodium 135 135 - 145 mmol/L   Potassium 4.0 3.5 - 5.1 mmol/L   Chloride 104 98 - 111 mmol/L   CO2 23 22 - 32 mmol/L   Glucose, Bld 115 (H) 70 - 99 mg/dL    Comment: Glucose reference range applies only to samples taken after fasting for at least 8 hours.   BUN 12 8 - 23 mg/dL   Creatinine, Ser 8.46 (H) 0.61 - 1.24 mg/dL   Calcium 9.5 8.9 - 96.2 mg/dL   Total Protein 6.3 (L) 6.5 - 8.1 g/dL   Albumin 3.3 (L) 3.5 - 5.0 g/dL   AST 51 (H) 15 - 41 U/L   ALT 51 (H) 0 - 44 U/L   Alkaline Phosphatase 36 (L) 38 - 126 U/L   Total Bilirubin 0.9 <1.2 mg/dL   GFR, Estimated 50 (L) >60 mL/min    Comment: (NOTE) Calculated using the CKD-EPI Creatinine Equation (2021)     Anion gap 8 5 - 15    Comment: Performed at [facility], [address], [place], [place] [address]  Brain natriuretic peptide     Status: None   Collection Time: 12/18/22  6:44 AM  Result Value Ref Range   B Natriuretic Peptide 75.7 0.0 - 100.0 pg/mL    Comment: Performed at [facility], [address], [place], [place] [address]  TSH     Status: None   Collection Time: 12/18/22  6:44 AM  Result Value Ref Range   TSH 1.870 0.350 - 4.500 uIU/mL    Comment: Performed by a 3rd Generation assay with a functional sensitivity of <=0.01 uIU/mL. Performed at [facility], [address], [place], [place] [address]    ECHOCARDIOGRAM COMPLETE  Result Date: 12/18/2022    ECHOCARDIOGRAM REPORT   Patient Name:   Travis Shea Date of Exam: 12/18/2022 Medical Rec #:  [id number]       Height:       69.0 in Accession #:    [id number]      Weight:       210.0 lb Date of Birth:  [birth year]  BSA:          2.109 m Patient Age:    67 years        BP:           146/89 mmHg Patient Gender: M               HR:           70 bpm. Exam Location:  Inpatient Procedure: 2D Echo, Cardiac Doppler and Color Doppler Indications:    R55 Syncope  History:        Patient has no prior history of Echocardiogram examinations.                 Signs/Symptoms:Syncope; Risk Factors:Hypertension.  Sonographer:    Eulah Pont RDCS Referring Phys: Angie Fava IMPRESSIONS  1. Left ventricular ejection fraction, by estimation, is 20 to 25%. The left ventricle has severely decreased function. The left ventricle demonstrates global hypokinesis. The left ventricular internal cavity size was moderately dilated. Left ventricular diastolic parameters are consistent with Grade I diastolic dysfunction (impaired relaxation).  2. Right ventricular systolic function is mildly reduced. The right ventricular size is normal. There is normal pulmonary artery systolic pressure.  3. Left atrial size was mildly dilated.  4. The  mitral valve is normal in structure. Trivial mitral valve regurgitation. No evidence of mitral stenosis.  5. Cannot exclude bicuspid aortic valve.Degree of AS may be underestimated due to severe LV dysfunction. The aortic valve was not well visualized. There is moderate calcification of the aortic valve. Aortic valve regurgitation is mild. Mild aortic valve  stenosis. Aortic valve area, by VTI measures 1.25 cm. Aortic valve mean gradient measures 12.0 mmHg. Aortic valve Vmax measures 2.34 m/s.  6. Aortic dilatation noted. There is mild dilatation of the aortic root, measuring 43 mm. There is mild dilatation of the aortic root, measuring 43 mm.  7. The inferior vena cava is normal in size with greater than 50% respiratory variability, suggesting right atrial pressure of 3 mmHg. FINDINGS  Left Ventricle: Left ventricular ejection fraction, by estimation, is 20 to 25%. The left ventricle has severely decreased function. The left ventricle demonstrates global hypokinesis. The left ventricular internal cavity size was moderately dilated. There is no left ventricular hypertrophy. Left ventricular diastolic parameters are consistent with Grade I diastolic dysfunction (impaired relaxation). Right Ventricle: The right ventricular size is normal. No increase in right ventricular wall thickness. Right ventricular systolic function is mildly reduced. There is normal pulmonary artery systolic pressure. The tricuspid regurgitant velocity is 1.86 m/s, and with an assumed right atrial pressure of 3 mmHg, the estimated right ventricular systolic pressure is 16.8 mmHg. Left Atrium: Left atrial size was mildly dilated. Right Atrium: Right atrial size was normal in size. Pericardium: There is no evidence of pericardial effusion. Mitral Valve: The mitral valve is normal in structure. Trivial mitral valve regurgitation. No evidence of mitral valve stenosis. Tricuspid Valve: The tricuspid valve is normal in structure. Tricuspid valve  regurgitation is trivial. No evidence of tricuspid stenosis. Aortic Valve: Cannot exclude bicuspid aortic valve.Degree of AS may be underestimated due to severe LV dysfunction. The aortic valve was not well visualized. There is moderate calcification of the aortic valve. Aortic valve regurgitation is mild. Aortic regurgitation PHT measures 533 msec. Mild aortic stenosis is present. Aortic valve mean gradient measures 12.0 mmHg. Aortic valve peak gradient measures 21.9 mmHg. Aortic valve area, by VTI measures 1.25 cm. Pulmonic Valve: The pulmonic valve was normal in structure. Pulmonic  valve regurgitation is not visualized. No evidence of pulmonic stenosis. Aorta: Aortic dilatation noted. There is mild dilatation of the aortic root, measuring 43 mm. There is mild dilatation of the aortic root, measuring 43 mm. Venous: The inferior vena cava is normal in size with greater than 50% respiratory variability, suggesting right atrial pressure of 3 mmHg. IAS/Shunts: No atrial level shunt detected by color flow Doppler.  LEFT VENTRICLE PLAX 2D LVIDd:         6.50 cm      Diastology LVIDs:         4.90 cm      LV e' medial:    4.78 cm/s LV PW:         0.90 cm      LV E/e' medial:  14.3 LV IVS:        0.90 cm      LV e' lateral:   7.26 cm/s LVOT diam:     2.10 cm      LV E/e' lateral: 9.4 LV SV:         52 LV SV Index:   25 LVOT Area:     3.46 cm  LV Volumes (MOD) LV vol d, MOD A2C: 181.0 ml LV vol d, MOD A4C: 186.0 ml LV vol s, MOD A2C: 118.0 ml LV vol s, MOD A4C: 105.0 ml LV SV MOD A2C:     63.0 ml LV SV MOD A4C:     186.0 ml LV SV MOD BP:      73.5 ml RIGHT VENTRICLE RV S prime:     11.50 cm/s TAPSE (M-mode): 2.1 cm LEFT ATRIUM             Index        RIGHT ATRIUM           Index LA diam:        5.20 cm 2.47 cm/m   RA Area:     15.50 cm LA Vol (A2C):   90.0 ml 42.67 ml/m  RA Volume:   39.90 ml  18.92 ml/m LA Vol (A4C):   59.2 ml 28.07 ml/m LA Biplane Vol: 72.9 ml 34.56 ml/m  AORTIC VALVE AV Area (Vmax):    1.17 cm  AV Area (Vmean):   1.07 cm AV Area (VTI):     1.25 cm AV Vmax:           234.00 cm/s AV Vmean:          160.250 cm/s AV VTI:            0.417 m AV Peak Grad:      21.9 mmHg AV Mean Grad:      12.0 mmHg LVOT Vmax:         79.30 cm/s LVOT Vmean:        49.700 cm/s LVOT VTI:          0.150 m LVOT/AV VTI ratio: 0.36 AI PHT:            533 msec  AORTA Ao Root diam: 4.30 cm Ao Asc diam:  4.30 cm MITRAL VALVE               TRICUSPID VALVE MV Area (PHT): 3.72 cm    TR Peak grad:   13.8 mmHg MV Decel Time: 204 msec    TR Vmax:        186.00 cm/s MV E velocity: 68.15 cm/s MV A velocity: 82.25 cm/s  SHUNTS MV E/A ratio:  0.83  Systemic VTI:  0.15 m                            Systemic Diam: 2.10 cm Arvilla Meres MD Electronically signed by Arvilla Meres MD Signature Date/Time: 12/18/2022/10:25:06 AM    Final    US Aorta  Result Date: 12/18/2022 CLINICAL DATA:  Syncope EXAM: ULTRASOUND OF ABDOMINAL AORTA TECHNIQUE: Ultrasound examination of the abdominal aorta and proximal common iliac arteries was performed to evaluate for aneurysm. Additional color and Doppler images of the distal aorta were obtained to document patency. COMPARISON:  Chest CT today FINDINGS: Abdominal aortic measurements as follows: Proximal:  2.6 cm Mid:  3.3 cm Distal:  2.3 cm Patent: Yes, peak systolic velocity is 81 cm/s Right common iliac artery: 1.9 cm Left common iliac artery: 1.3 cm IMPRESSION: 3.3 cm mid abdominal aortic aneurysm. Recommend follow-up ultrasound every 3 years. (Ref.: J Vasc Surg. 2018; 67:2-77 and J Am Coll Radiol 2013;10(10):789-794.) Electronically Signed   By: Charlett Nose M.D.   On: 12/18/2022 03:00   CT Head Wo Contrast  Result Date: 12/18/2022 CLINICAL DATA:  Syncope found down EXAM: CT HEAD WITHOUT CONTRAST CT CERVICAL SPINE WITHOUT CONTRAST TECHNIQUE: Multidetector CT imaging of the head and cervical spine was performed following the standard protocol without intravenous contrast. Multiplanar CT image  reconstructions of the cervical spine were also generated. RADIATION DOSE REDUCTION: This exam was performed according to the departmental dose-optimization program which includes automated exposure control, adjustment of the mA and/or kV according to patient size and/or use of iterative reconstruction technique. COMPARISON:  None Available. FINDINGS: CT HEAD FINDINGS Brain: No evidence of acute infarction, hemorrhage, hydrocephalus, extra-axial collection or mass lesion/mass effect. Subcortical white matter and periventricular small vessel ischemic changes. Vascular: No hyperdense vessel or unexpected calcification. Skull: Normal. Negative for fracture or focal lesion. Sinuses/Orbits: The visualized paranasal sinuses are essentially clear. The mastoid air cells are unopacified. Other: None. CT CERVICAL SPINE FINDINGS Alignment: Normal cervical lordosis. Skull base and vertebrae: No acute fracture. No primary bone lesion or focal pathologic process. Soft tissues and spinal canal: No prevertebral fluid or swelling. No visible canal hematoma. Disc levels: Mild degenerative changes of the mid cervical spine with bulky anterior osteophytosis. Spinal canal is patent. Upper chest: Visualized lung apices are clear. Other: Visualized thyroid is unremarkable with IMPRESSION: No acute intracranial abnormality. Small vessel ischemic changes. No traumatic injury to the cervical spine. Mild degenerative changes. Electronically Signed   By: Charline Bills M.D.   On: 12/18/2022 02:11   CT Cervical Spine Wo Contrast  Result Date: 12/18/2022 CLINICAL DATA:  Syncope found down EXAM: CT HEAD WITHOUT CONTRAST CT CERVICAL SPINE WITHOUT CONTRAST TECHNIQUE: Multidetector CT imaging of the head and cervical spine was performed following the standard protocol without intravenous contrast. Multiplanar CT image reconstructions of the cervical spine were also generated. RADIATION DOSE REDUCTION: This exam was performed according to the  departmental dose-optimization program which includes automated exposure control, adjustment of the mA and/or kV according to patient size and/or use of iterative reconstruction technique. COMPARISON:  None Available. FINDINGS: CT HEAD FINDINGS Brain: No evidence of acute infarction, hemorrhage, hydrocephalus, extra-axial collection or mass lesion/mass effect. Subcortical white matter and periventricular small vessel ischemic changes. Vascular: No hyperdense vessel or unexpected calcification. Skull: Normal. Negative for fracture or focal lesion. Sinuses/Orbits: The visualized paranasal sinuses are essentially clear. The mastoid air cells are unopacified. Other: None. CT CERVICAL SPINE FINDINGS Alignment: Normal cervical  lordosis. Skull base and vertebrae: No acute fracture. No primary bone lesion or focal pathologic process. Soft tissues and spinal canal: No prevertebral fluid or swelling. No visible canal hematoma. Disc levels: Mild degenerative changes of the mid cervical spine with bulky anterior osteophytosis. Spinal canal is patent. Upper chest: Visualized lung apices are clear. Other: Visualized thyroid is unremarkable with IMPRESSION: No acute intracranial abnormality. Small vessel ischemic changes. No traumatic injury to the cervical spine. Mild degenerative changes. Electronically Signed   By: Charline Bills M.D.   On: 12/18/2022 02:11   CT Angio Chest PE W and/or Wo Contrast  Result Date: 12/18/2022 CLINICAL DATA:  Pulmonary embolism, syncope EXAM: CT ANGIOGRAPHY CHEST WITH CONTRAST TECHNIQUE: Multidetector CT imaging of the chest was performed using the standard protocol during bolus administration of intravenous contrast. Multiplanar CT image reconstructions and MIPs were obtained to evaluate the vascular anatomy. RADIATION DOSE REDUCTION: This exam was performed according to the departmental dose-optimization program which includes automated exposure control, adjustment of the mA and/or kV  according to patient size and/or use of iterative reconstruction technique. CONTRAST:  75mL OMNIPAQUE IOHEXOL 350 MG/ML SOLN COMPARISON:  None Available. FINDINGS: Cardiovascular: Opacification of the pulmonary arterial tree is slightly suboptimal, however, the examination is still diagnostic for the exclusion of acute pulmonary embolism through the segmental level. No intraluminal filling defects are identified. The pulmonary arteries are of normal caliber. Moderate predominantly left anterior descending coronary artery calcification is present. Mild global cardiomegaly with hypertrophy and dilation of the left ventricle. No pericardial effusion. The thoracic aorta is diffusely dilated measuring up to 4.3 cm in diameter in its ascending segment and 3.4 cm in diameter in its proximal descending segment. The distal aorta measures 3.1 cm in diameter at the level of the diaphragmatic hiatus. Mediastinum/Nodes: No enlarged mediastinal, hilar, or axillary lymph nodes. Thyroid gland, trachea, and esophagus demonstrate no significant findings. Small hiatal hernia. Lungs/Pleura: Lungs are clear. No pleural effusion or pneumothorax. Upper Abdomen: No acute abnormality. Musculoskeletal: No chest wall abnormality. No acute or significant osseous findings. Review of the MIP images confirms the above findings. IMPRESSION: 1. No pulmonary embolism. 2. Moderate coronary artery calcification. Mild global cardiomegaly with hypertrophy and dilation of the left ventricle. 3. 4.3 cm ascending thoracic aortic aneurysm. Recommend annual imaging followup by CTA or MRA. This recommendation follows 2010 ACCF/AHA/AATS/ACR/ASA/SCA/SCAI/SIR/STS/SVM Guidelines for the Diagnosis and Management of Patients with Thoracic Aortic Disease. Circulation. 2010; 121: K440-N027. Aortic aneurysm NOS (ICD10-I71.9) 4. Small hiatal hernia. Electronically Signed   By: Helyn Numbers M.D.   On: 12/18/2022 01:09   DG Chest 2 View  Result Date:  12/17/2022 CLINICAL DATA:  Shortness of breath EXAM: CHEST - 2 VIEW COMPARISON:  11/28/2022 FINDINGS: Lungs are clear.  No pleural effusion or pneumothorax. The heart is normal in size. Degenerative changes of the visualized thoracolumbar spine. IMPRESSION: Normal chest radiographs. Electronically Signed   By: Charline Bills M.D.   On: 12/17/2022 23:06    Pending Labs Unresulted Labs (From admission, onward)     Start     Ordered   12/19/22 0500  CBC  Tomorrow morning,   R        12/18/22 0730   12/19/22 0500  Basic metabolic panel  Tomorrow morning,   R        12/18/22 0730            Vitals/Pain Today's Vitals   12/18/22 0715 12/18/22 0726 12/18/22 1116 12/18/22 1418  BP: (!) 147/89 (!) 146/89  122/63   Pulse: 79 75 78 82  Resp: 17 13 16  (!) 21  Temp:  97.7 F (36.5 C) 97.7 F (36.5 C)   TempSrc:  Oral Oral   SpO2: 98% 99% 99% 100%  Weight:      Height:      PainSc:        Isolation Precautions No active isolations  Medications Medications  acetaminophen (TYLENOL) tablet 650 mg (has no administration in time range)    Or  acetaminophen (TYLENOL) suppository 650 mg (has no administration in time range)  melatonin tablet 3 mg (has no administration in time range)  ondansetron (ZOFRAN) injection 4 mg (has no administration in time range)  0.9 %  sodium chloride infusion ( Intravenous New Bag/Given 12/18/22 0927)  pantoprazole (PROTONIX) EC tablet 40 mg (40 mg Oral Given 12/18/22 0928)  enoxaparin (LOVENOX) injection 40 mg (40 mg Subcutaneous Given 12/18/22 0928)  sodium chloride flush (NS) 0.9 % injection 3 mL (3 mLs Intravenous Given 12/18/22 0928)  albuterol (PROVENTIL) (2.5 MG/3ML) 0.083% nebulizer solution 2.5 mg (has no administration in time range)  hydrALAZINE (APRESOLINE) injection 10 mg (has no administration in time range)  LORazepam (ATIVAN) tablet 1-4 mg (has no administration in time range)    Or  LORazepam (ATIVAN) injection 1-4 mg (has no administration  in time range)  thiamine (VITAMIN B1) tablet 100 mg (100 mg Oral Given 12/18/22 0928)    Or  thiamine (VITAMIN B1) injection 100 mg ( Intravenous See Alternative 12/18/22 0928)  folic acid (FOLVITE) tablet 1 mg (1 mg Oral Given 12/18/22 0928)  multivitamin with minerals tablet 1 tablet (1 tablet Oral Given 12/18/22 0928)  ketorolac (TORADOL) 15 MG/ML injection 15 mg (15 mg Intravenous Given 12/18/22 0013)  iohexol (OMNIPAQUE) 350 MG/ML injection 75 mL (75 mLs Intravenous Contrast Given 12/18/22 0100)  prochlorperazine (COMPAZINE) injection 10 mg (10 mg Intravenous Given 12/18/22 0407)  acetaminophen (TYLENOL) tablet 1,000 mg (1,000 mg Oral Given 12/18/22 0406)  potassium chloride SA (KLOR-CON M) CR tablet 40 mEq (40 mEq Oral Given 12/18/22 0527)    Mobility walks     Focused Assessments Cardiac Assessment Handoff:    No results found for: "CKTOTAL", "CKMB", "CKMBINDEX", "TROPONINI" No results found for: "DDIMER" Does the Patient currently have chest pain? No    R Recommendations: See Admitting Provider Note  Report given to:   Additional Notes: Plan for Right and Left Cath on Wednesday

## 2022-12-18 NOTE — Progress Notes (Signed)
  Carryover admission to the Day Admitter.  I discussed this case with the EDP, Barrie Dunker, PA.  Per these discussions:   This is a a 67 year old male who is being admitted with a single syncopal episode.  The patient was sitting on his sofa at home earlier in the evening, watching television, when he suddenly became dizzy and lost consciousness, in the absence of any preceding positional changes.  When he lost consciousness, he slid off of the couch, and struck his head.  Wife was nearby, heard the associated thump, and was at the patient's side within seconds, noting no associated tonic-clonic activity.  The patient denies any recent chest pain, and is now asymptomatic.  No additional episodes of syncope.  Orthostatic vital signs were reported to be negative.  Initial troponin 108, with repeat value trending down to 96.  EKG is reported to show no evidence of acute ischemic changes.  Potassium 3.4. CTA chest showed no evidence of acute pulmonary embolism.  EDP discussed with on-call cardiology fellow, who conveyed that cardiology will formally consult.   I have placed an order for observation for further evaluation management of the above.  I have placed some additional preliminary admit orders via the adult multi-morbid admission order set. I have also ordered serum magnesium level as well as morning labs that include CMP and CBC.  I ordered potassium chloride 40 mill equivalents p.o. x 1 dose now, fall precautions, echocardiogram.    Newton Pigg, DO Hospitalist

## 2022-12-18 NOTE — Progress Notes (Signed)
Echocardiogram 2D Echocardiogram has been performed.  Augustine Radar 12/18/2022, 9:58 AM

## 2022-12-18 NOTE — Consult Note (Signed)
Advanced Heart Failure Team Consult Note   Primary Physician: Myrlene Broker, MD PCP-Cardiologist:  None  Reason for Consultation: Acute Heart failure  HPI:    Travoris Bushey is seen today for evaluation of acute heart failure at the request of Dr. Katrinka Blazing.   Patient  is a 67 yo male with past medical history of HTN, GERD, ETOH use, cocaine use and osteoarthritis.  Patient reports long history of alcohol and cocaine use (last use 10 days ago), and prior anabolic steroid use in 20's. Reports he has been having diarrhea and severe abdominal pain intermittently since his screening endoscopy 8/26, also around that time reports that he had COVID, did not self test. Seen 10/15 by PCP for fatigue and new cough and was treated for pneumonia with course of Doxycyline.   Pt presented to the ED overnight after syncopal event while sitting on the couch at home. During event he did hit his head on the floor. The day before had tolerated working in the yard for about 3 hours. CT head no acute findings, small vessel ischemic changes. CT PE negative, but notable for moderate coronary artery calcification and 4.3 cm ascending thoracic aortic aneurysm. Lab on admission K 3.4, Cr 1.52, AST/ALT 51/51, Trop 108>97, BNP 75.7. CXR normal. Abd US showed 3.3 cm abd aortic aneurysm. Echo obtained.  Echo 11/4: EF 20-25% LV GHK, dilated. Grade I diastolic dysfunction. RV mildly reduced. Mild AS. Cannot exclude bicuspid valve. AR mild. AV gradient 12, peak 22. AVA 12.25. Aortic root dilation.  Home Medications Prior to Admission medications   Medication Sig Start Date End Date Taking? Authorizing Provider  acetaminophen (TYLENOL) 325 MG tablet Take 650 mg by mouth daily as needed for mild pain (pain score 1-3) (Shoulder arthritis).   Yes [provider]  Cyanocobalamin 500 MCG LOZG Take 500 mcg by mouth daily.   Yes [provider]  Multiple Vitamins-Minerals (MULTIVITAMIN WITH MINERALS)  tablet Take 1 tablet by mouth daily. Mens Centrum   Yes [provider]  omeprazole (PRILOSEC) 40 MG capsule Take 1 capsule (40 mg total) by mouth daily. 11/28/22  Yes Myrlene Broker, MD  ondansetron (ZOFRAN-ODT) 4 MG disintegrating tablet Dissolve 1 tablet (4 mg total) in mouth every 8 (eight) hours as needed for nausea or vomiting. 11/28/22  Yes Myrlene Broker, MD    Past Medical History: Past Medical History:  Diagnosis Date   GERD (gastroesophageal reflux disease)    Hypertension    Left knee injury     Past Surgical History: Past Surgical History:  Procedure Laterality Date   COLONOSCOPY     FOOT FRACTURE SURGERY Right    KNEE SURGERY     TOTAL HIP ARTHROPLASTY Right     Family History: Family History  Problem Relation Age of Onset   Dementia Mother    Lung cancer Father    Other Sister        Lyme's disease   Diabetes Maternal Grandmother    Colon cancer Neg Hx    Colon polyps Neg Hx    Rectal cancer Neg Hx    Stomach cancer Neg Hx     Social History: Social History   Socioeconomic History   Marital status: Married    Spouse name: Not on file   Number of children: 2   Years of education: Not on file   Highest education level: Bachelor's degree (e.g., BA, AB, BS)  Occupational History   Occupation: retiter  Tobacco Use  Smoking status: Never   Smokeless tobacco: Never  Vaping Use   Vaping status: Never Used  Substance and Sexual Activity   Alcohol use: Yes    Comment: Patient reports drinking a 1-2 mixed drinks of whiskey or bourbon a couple days per week   Drug use: Not Currently    Types: "Crack" cocaine, Marijuana   Sexual activity: Not on file  Other Topics Concern   Not on file  Social History Narrative   Not on file   Social Determinants of Health   Financial Resource Strain: Patient Declined (11/28/2022)   Overall Financial Resource Strain (CARDIA)    Difficulty of Paying Living Expenses: Patient declined  Food  Insecurity: Patient Declined (11/28/2022)   Hunger Vital Sign    Worried About Running Out of Food in the Last Year: Patient declined    Ran Out of Food in the Last Year: Patient declined  Transportation Needs: No Transportation Needs (11/28/2022)   PRAPARE - Administrator, Civil Service (Medical): No    Lack of Transportation (Non-Medical): No  Physical Activity: Sufficiently Active (11/28/2022)   Exercise Vital Sign    Days of Exercise per Week: 4 days    Minutes of Exercise per Session: 80 min  Stress: No Stress Concern Present (11/28/2022)   [employer] of Occupational Health - Occupational Stress Questionnaire    Feeling of Stress : Only a little  Social Connections: Moderately Isolated (11/28/2022)   Social Connection and Isolation Panel [NHANES]    Frequency of Communication with Friends and Family: Three times a week    Frequency of Social Gatherings with Friends and Family: Once a week    Attends Religious Services: 1 to 4 times per year    Active Member of [employer] or Organizations: No    Attends Engineer, structural: Not on file    Marital Status: Separated    Allergies:  Allergies  Allergen Reactions   Morphine And Codeine Nausea And Vomiting   Other Nausea And Vomiting   Buprenorphine Hcl Nausea And Vomiting    Objective:    Vital Signs:   Temp:  [97.6 F (36.4 C)-98 F (36.7 C)] 97.7 F (36.5 C) (11/04 1116) Pulse Rate:  [66-79] 78 (11/04 1116) Resp:  [11-18] 16 (11/04 1116) BP: (122-147)/(57-102) 122/63 (11/04 1116) SpO2:  [94 %-100 %] 99 % (11/04 1116) Weight:  [95.3 kg] 95.3 kg (11/03 2222)    Weight change: Filed Weights   12/17/22 2222  Weight: 95.3 kg    Intake/Output:  No intake or output data in the 24 hours ending 12/18/22 1407    Physical Exam    General:  Well appearing. No resp difficulty HEENT: normal Neck: supple. JVP not visible. Carotids 2+ bilat; no bruits. No lymphadenopathy or thyromegaly  appreciated. Cor: PMI nondisplaced. Regular rate & rhythm. No rubs, gallops or murmurs. Lungs: clear Abdomen: taut, nontender, distended. No hepatosplenomegaly. No bruits or masses. Distant bowel sounds. Extremities: no cyanosis, clubbing, rash, or edema Neuro: alert & orientedx3, cranial nerves grossly intact. moves all 4 extremities w/o difficulty. Affect pleasant  Telemetry   SR in 70s (9 beat run NSVT at 11am)  EKG    Pending  Labs   Basic Metabolic Panel: Recent Labs  Lab 12/17/22 2226 12/18/22 0644  NA 135 135  K 3.4* 4.0  CL 102 104  CO2 22 23  GLUCOSE 133* 115*  BUN 12 12  CREATININE 1.52* 1.52*  CALCIUM 9.7 9.5  MG  --  2.0    Liver Function Tests: Recent Labs  Lab 12/18/22 0644  AST 51*  ALT 51*  ALKPHOS 36*  BILITOT 0.9  PROT 6.3*  ALBUMIN 3.3*   No results for input(s): "LIPASE", "AMYLASE" in the last 168 hours. No results for input(s): "AMMONIA" in the last 168 hours.  CBC: Recent Labs  Lab 12/17/22 2226 12/18/22 0644  WBC 9.0 9.3  NEUTROABS 5.8 5.9  HGB 15.0 15.1  HCT 45.2 47.0  MCV 88.6 93.1  PLT 309 250    Cardiac Enzymes: No results for input(s): "CKTOTAL", "CKMB", "CKMBINDEX", "TROPONINI" in the last 168 hours.  BNP: BNP (last 3 results) Recent Labs    12/18/22 0644  BNP 75.7    ProBNP (last 3 results) No results for input(s): "PROBNP" in the last 8760 hours.   CBG: Recent Labs  Lab 12/17/22 2234  GLUCAP 145*    Coagulation Studies: No results for input(s): "LABPROT", "INR" in the last 72 hours.   Imaging   ECHOCARDIOGRAM COMPLETE  Result Date: 12/18/2022    ECHOCARDIOGRAM REPORT   Patient Name:   Chavis Tessler Date of Exam: 12/18/2022 Medical Rec #:  [id number]       Height:       69.0 in Accession #:    [id number]      Weight:       210.0 lb Date of Birth:  1955/06/03      BSA:          2.109 m Patient Age:    67 years        BP:           146/89 mmHg Patient Gender: M               HR:           70 bpm.  Exam Location:  Inpatient Procedure: 2D Echo, Cardiac Doppler and Color Doppler Indications:    R55 Syncope  History:        Patient has no prior history of Echocardiogram examinations.                 Signs/Symptoms:Syncope; Risk Factors:Hypertension.  Sonographer:    Eulah Pont RDCS Referring Phys: Angie Fava IMPRESSIONS  1. Left ventricular ejection fraction, by estimation, is 20 to 25%. The left ventricle has severely decreased function. The left ventricle demonstrates global hypokinesis. The left ventricular internal cavity size was moderately dilated. Left ventricular diastolic parameters are consistent with Grade I diastolic dysfunction (impaired relaxation).  2. Right ventricular systolic function is mildly reduced. The right ventricular size is normal. There is normal pulmonary artery systolic pressure.  3. Left atrial size was mildly dilated.  4. The mitral valve is normal in structure. Trivial mitral valve regurgitation. No evidence of mitral stenosis.  5. Cannot exclude bicuspid aortic valve.Degree of AS may be underestimated due to severe LV dysfunction. The aortic valve was not well visualized. There is moderate calcification of the aortic valve. Aortic valve regurgitation is mild. Mild aortic valve  stenosis. Aortic valve area, by VTI measures 1.25 cm. Aortic valve mean gradient measures 12.0 mmHg. Aortic valve Vmax measures 2.34 m/s.  6. Aortic dilatation noted. There is mild dilatation of the aortic root, measuring 43 mm. There is mild dilatation of the aortic root, measuring 43 mm.  7. The inferior vena cava is normal in size with greater than 50% respiratory variability, suggesting right atrial pressure of 3 mmHg. FINDINGS  Left Ventricle: Left ventricular ejection fraction, by  estimation, is 20 to 25%. The left ventricle has severely decreased function. The left ventricle demonstrates global hypokinesis. The left ventricular internal cavity size was moderately dilated. There is no  left ventricular hypertrophy. Left ventricular diastolic parameters are consistent with Grade I diastolic dysfunction (impaired relaxation). Right Ventricle: The right ventricular size is normal. No increase in right ventricular wall thickness. Right ventricular systolic function is mildly reduced. There is normal pulmonary artery systolic pressure. The tricuspid regurgitant velocity is 1.86 m/s, and with an assumed right atrial pressure of 3 mmHg, the estimated right ventricular systolic pressure is 16.8 mmHg. Left Atrium: Left atrial size was mildly dilated. Right Atrium: Right atrial size was normal in size. Pericardium: There is no evidence of pericardial effusion. Mitral Valve: The mitral valve is normal in structure. Trivial mitral valve regurgitation. No evidence of mitral valve stenosis. Tricuspid Valve: The tricuspid valve is normal in structure. Tricuspid valve regurgitation is trivial. No evidence of tricuspid stenosis. Aortic Valve: Cannot exclude bicuspid aortic valve.Degree of AS may be underestimated due to severe LV dysfunction. The aortic valve was not well visualized. There is moderate calcification of the aortic valve. Aortic valve regurgitation is mild. Aortic regurgitation PHT measures 533 msec. Mild aortic stenosis is present. Aortic valve mean gradient measures 12.0 mmHg. Aortic valve peak gradient measures 21.9 mmHg. Aortic valve area, by VTI measures 1.25 cm. Pulmonic Valve: The pulmonic valve was normal in structure. Pulmonic valve regurgitation is not visualized. No evidence of pulmonic stenosis. Aorta: Aortic dilatation noted. There is mild dilatation of the aortic root, measuring 43 mm. There is mild dilatation of the aortic root, measuring 43 mm. Venous: The inferior vena cava is normal in size with greater than 50% respiratory variability, suggesting right atrial pressure of 3 mmHg. IAS/Shunts: No atrial level shunt detected by color flow Doppler.  LEFT VENTRICLE PLAX 2D LVIDd:          6.50 cm      Diastology LVIDs:         4.90 cm      LV e' medial:    4.78 cm/s LV PW:         0.90 cm      LV E/e' medial:  14.3 LV IVS:        0.90 cm      LV e' lateral:   7.26 cm/s LVOT diam:     2.10 cm      LV E/e' lateral: 9.4 LV SV:         52 LV SV Index:   25 LVOT Area:     3.46 cm  LV Volumes (MOD) LV vol d, MOD A2C: 181.0 ml LV vol d, MOD A4C: 186.0 ml LV vol s, MOD A2C: 118.0 ml LV vol s, MOD A4C: 105.0 ml LV SV MOD A2C:     63.0 ml LV SV MOD A4C:     186.0 ml LV SV MOD BP:      73.5 ml RIGHT VENTRICLE RV S prime:     11.50 cm/s TAPSE (M-mode): 2.1 cm LEFT ATRIUM             Index        RIGHT ATRIUM           Index LA diam:        5.20 cm 2.47 cm/m   RA Area:     15.50 cm LA Vol (A2C):   90.0 ml 42.67 ml/m  RA Volume:   39.90 ml  18.92 ml/m LA Vol (A4C):   59.2 ml 28.07 ml/m LA Biplane Vol: 72.9 ml 34.56 ml/m  AORTIC VALVE AV Area (Vmax):    1.17 cm AV Area (Vmean):   1.07 cm AV Area (VTI):     1.25 cm AV Vmax:           234.00 cm/s AV Vmean:          160.250 cm/s AV VTI:            0.417 m AV Peak Grad:      21.9 mmHg AV Mean Grad:      12.0 mmHg LVOT Vmax:         79.30 cm/s LVOT Vmean:        49.700 cm/s LVOT VTI:          0.150 m LVOT/AV VTI ratio: 0.36 AI PHT:            533 msec  AORTA Ao Root diam: 4.30 cm Ao Asc diam:  4.30 cm MITRAL VALVE               TRICUSPID VALVE MV Area (PHT): 3.72 cm    TR Peak grad:   13.8 mmHg MV Decel Time: 204 msec    TR Vmax:        186.00 cm/s MV E velocity: 68.15 cm/s MV A velocity: 82.25 cm/s  SHUNTS MV E/A ratio:  0.83        Systemic VTI:  0.15 m                            Systemic Diam: 2.10 cm Arvilla Meres MD Electronically signed by Arvilla Meres MD Signature Date/Time: 12/18/2022/10:25:06 AM    Final    US Aorta  Result Date: 12/18/2022 CLINICAL DATA:  Syncope EXAM: ULTRASOUND OF ABDOMINAL AORTA TECHNIQUE: Ultrasound examination of the abdominal aorta and proximal common iliac arteries was performed to evaluate for aneurysm. Additional  color and Doppler images of the distal aorta were obtained to document patency. COMPARISON:  Chest CT today FINDINGS: Abdominal aortic measurements as follows: Proximal:  2.6 cm Mid:  3.3 cm Distal:  2.3 cm Patent: Yes, peak systolic velocity is 81 cm/s Right common iliac artery: 1.9 cm Left common iliac artery: 1.3 cm IMPRESSION: 3.3 cm mid abdominal aortic aneurysm. Recommend follow-up ultrasound every 3 years. (Ref.: J Vasc Surg. 2018; 67:2-77 and J Am Coll Radiol 2013;10(10):789-794.) Electronically Signed   By: Charlett Nose M.D.   On: 12/18/2022 03:00   CT Head Wo Contrast  Result Date: 12/18/2022 CLINICAL DATA:  Syncope found down EXAM: CT HEAD WITHOUT CONTRAST CT CERVICAL SPINE WITHOUT CONTRAST TECHNIQUE: Multidetector CT imaging of the head and cervical spine was performed following the standard protocol without intravenous contrast. Multiplanar CT image reconstructions of the cervical spine were also generated. RADIATION DOSE REDUCTION: This exam was performed according to the departmental dose-optimization program which includes automated exposure control, adjustment of the mA and/or kV according to patient size and/or use of iterative reconstruction technique. COMPARISON:  None Available. FINDINGS: CT HEAD FINDINGS Brain: No evidence of acute infarction, hemorrhage, hydrocephalus, extra-axial collection or mass lesion/mass effect. Subcortical white matter and periventricular small vessel ischemic changes. Vascular: No hyperdense vessel or unexpected calcification. Skull: Normal. Negative for fracture or focal lesion. Sinuses/Orbits: The visualized paranasal sinuses are essentially clear. The mastoid air cells are unopacified. Other: None. CT CERVICAL SPINE FINDINGS Alignment: Normal cervical lordosis. Skull base and  vertebrae: No acute fracture. No primary bone lesion or focal pathologic process. Soft tissues and spinal canal: No prevertebral fluid or swelling. No visible canal hematoma. Disc levels:  Mild degenerative changes of the mid cervical spine with bulky anterior osteophytosis. Spinal canal is patent. Upper chest: Visualized lung apices are clear. Other: Visualized thyroid is unremarkable with IMPRESSION: No acute intracranial abnormality. Small vessel ischemic changes. No traumatic injury to the cervical spine. Mild degenerative changes. Electronically Signed   By: Charline Bills M.D.   On: 12/18/2022 02:11   CT Cervical Spine Wo Contrast  Result Date: 12/18/2022 CLINICAL DATA:  Syncope found down EXAM: CT HEAD WITHOUT CONTRAST CT CERVICAL SPINE WITHOUT CONTRAST TECHNIQUE: Multidetector CT imaging of the head and cervical spine was performed following the standard protocol without intravenous contrast. Multiplanar CT image reconstructions of the cervical spine were also generated. RADIATION DOSE REDUCTION: This exam was performed according to the departmental dose-optimization program which includes automated exposure control, adjustment of the mA and/or kV according to patient size and/or use of iterative reconstruction technique. COMPARISON:  None Available. FINDINGS: CT HEAD FINDINGS Brain: No evidence of acute infarction, hemorrhage, hydrocephalus, extra-axial collection or mass lesion/mass effect. Subcortical white matter and periventricular small vessel ischemic changes. Vascular: No hyperdense vessel or unexpected calcification. Skull: Normal. Negative for fracture or focal lesion. Sinuses/Orbits: The visualized paranasal sinuses are essentially clear. The mastoid air cells are unopacified. Other: None. CT CERVICAL SPINE FINDINGS Alignment: Normal cervical lordosis. Skull base and vertebrae: No acute fracture. No primary bone lesion or focal pathologic process. Soft tissues and spinal canal: No prevertebral fluid or swelling. No visible canal hematoma. Disc levels: Mild degenerative changes of the mid cervical spine with bulky anterior osteophytosis. Spinal canal is patent. Upper chest:  Visualized lung apices are clear. Other: Visualized thyroid is unremarkable with IMPRESSION: No acute intracranial abnormality. Small vessel ischemic changes. No traumatic injury to the cervical spine. Mild degenerative changes. Electronically Signed   By: Charline Bills M.D.   On: 12/18/2022 02:11   CT Angio Chest PE W and/or Wo Contrast  Result Date: 12/18/2022 CLINICAL DATA:  Pulmonary embolism, syncope EXAM: CT ANGIOGRAPHY CHEST WITH CONTRAST TECHNIQUE: Multidetector CT imaging of the chest was performed using the standard protocol during bolus administration of intravenous contrast. Multiplanar CT image reconstructions and MIPs were obtained to evaluate the vascular anatomy. RADIATION DOSE REDUCTION: This exam was performed according to the departmental dose-optimization program which includes automated exposure control, adjustment of the mA and/or kV according to patient size and/or use of iterative reconstruction technique. CONTRAST:  75mL OMNIPAQUE IOHEXOL 350 MG/ML SOLN COMPARISON:  None Available. FINDINGS: Cardiovascular: Opacification of the pulmonary arterial tree is slightly suboptimal, however, the examination is still diagnostic for the exclusion of acute pulmonary embolism through the segmental level. No intraluminal filling defects are identified. The pulmonary arteries are of normal caliber. Moderate predominantly left anterior descending coronary artery calcification is present. Mild global cardiomegaly with hypertrophy and dilation of the left ventricle. No pericardial effusion. The thoracic aorta is diffusely dilated measuring up to 4.3 cm in diameter in its ascending segment and 3.4 cm in diameter in its proximal descending segment. The distal aorta measures 3.1 cm in diameter at the level of the diaphragmatic hiatus. Mediastinum/Nodes: No enlarged mediastinal, hilar, or axillary lymph nodes. Thyroid gland, trachea, and esophagus demonstrate no significant findings. Small hiatal hernia.  Lungs/Pleura: Lungs are clear. No pleural effusion or pneumothorax. Upper Abdomen: No acute abnormality. Musculoskeletal: No chest wall abnormality. No acute  or significant osseous findings. Review of the MIP images confirms the above findings. IMPRESSION: 1. No pulmonary embolism. 2. Moderate coronary artery calcification. Mild global cardiomegaly with hypertrophy and dilation of the left ventricle. 3. 4.3 cm ascending thoracic aortic aneurysm. Recommend annual imaging followup by CTA or MRA. This recommendation follows 2010 ACCF/AHA/AATS/ACR/ASA/SCA/SCAI/SIR/STS/SVM Guidelines for the Diagnosis and Management of Patients with Thoracic Aortic Disease. Circulation. 2010; 121: W098-J191. Aortic aneurysm NOS (ICD10-I71.9) 4. Small hiatal hernia. Electronically Signed   By: Helyn Numbers M.D.   On: 12/18/2022 01:09   DG Chest 2 View  Result Date: 12/17/2022 CLINICAL DATA:  Shortness of breath EXAM: CHEST - 2 VIEW COMPARISON:  11/28/2022 FINDINGS: Lungs are clear.  No pleural effusion or pneumothorax. The heart is normal in size. Degenerative changes of the visualized thoracolumbar spine. IMPRESSION: Normal chest radiographs. Electronically Signed   By: Charline Bills M.D.   On: 12/17/2022 23:06    Medications:    Current Medications:  enoxaparin (LOVENOX) injection  40 mg Subcutaneous Daily   folic acid  1 mg Oral Daily   multivitamin with minerals  1 tablet Oral Daily   pantoprazole  40 mg Oral Daily   sodium chloride flush  3 mL Intravenous Q12H   thiamine  100 mg Oral Daily   Or   thiamine  100 mg Intravenous Daily    Infusions:  sodium chloride 100 mL/hr at 12/18/22 4782   Patient Profile   Domnique Vantine is a 67 yo 67 yo male with past medical history of HTN, GERD, and osteoarthritis. Admitted for syncope and acute heart failure.  Assessment/Plan   Acute Heart Failure - Echo 11/4: EF 20-25% LV GHK, dilated. Grade I diastolic dysfunction. RV mildly reduced. Mild AS. Cannot exclude  bicuspid valve. AR mild. AV gradient 12, peak 22. AVA 12.25. Aortic root dilation 4.3 cm. - Etiology uncertain. History of ETOH and cocaine use. No prior LHC. No familial history. Aortic stenosis not proportional to HF. Given episodic presentation concern for arrythmegenic. - Monitor on tele - RHC/LHC Wednesday - Appears euvolemic on exam - Start Entresto 24-26mg   - Start Farxiga 10 mg   Aortic Stenosis - mild to moderate noted on ECHO 11/4  AKI - sCr on admission 1.5 - baseline appears to be ~1.3 - Follow BMET  4. NSVT - 9 beat run noted on tele today - continuous tele monitoring  5. HTN - Start Entresto 24/26  6. Dyslipidemia - TGL 236, VLDL 47.2, HDL 25.7 - Start statin therapy  7. Thoracic/Abdominal Aortic Aneurysm  - 4.3 cm ascending aortic aneurysm noted on CT chest - 3.3 cm abdominal aortic aneurism noted on CT - Continue outpatient surveillance, per guidelines  8. ETOH use - cessation education  9. Cocaine use - history of use for years, last used 10 days ago - Endorses he is attempting to quit - USD pending   10. GERD/Abdominal bloating - continue Protonix - AST/ALT mildly elevated - Liver US  Length of Stay: 0  [place] Lee, NP  12/18/2022, 2:07 PM  Advanced Heart Failure Team Pager [phone] (M-F; 7a - 5p)  Please contact [facility] for night-coverage after hours (4p -7a ) and weekends on [email or web address]   Patient seen with NP, agree with the above note.   History as noted above.  Patient has h/o HTN but not currently being treated.  He has felt more fatigued and has had some dyspnea with heavy exertion for about 1-2 months.  No chest pain.  He has  had occasional episodes of lightheadedness after lifting heavy weights.  However, last night, was sitting on the sofa at home and got lightheaded/passed out.  He is not sure how long he was unconscious, wife called EMS and he was conscious when EMS arrived. No previous cardiac history, no family history of  cardiac disease that he knows of.   Echo in hospital showed EF 20-25%, moderate LV dilation, mild-moderate AS with mean gradient 14 mmHg and AVA 1.25 cm^2, mild AI, IVC normal.  BNP was normal.  Creatinine 1.5, up from prior 1.3. HS-TnI 108 => 97.  ECG with LVH with repolarization abnormality vs ischemic changes.  CTA chest with coronary calcification but no PE.   Of note, patient drinks moderately but also has used cocaine daily x years, quit 1 week prior to admission per his report.    Review of telemetry in ER showed 10 beat NSVT run.   General: NAD Neck: JVP 8 cm, no thyromegaly or thyroid nodule.  Lungs: Clear to auscultation bilaterally with normal respiratory effort. CV: Nondisplaced PMI.  Heart regular S1/S2, no S3/S4, 2/6 SEM RUSB with clear S2.  No peripheral edema.  Palpable pedal pulses.  Abdomen: Soft, nontender, no hepatosplenomegaly, moderate distention.  Skin: Intact without lesions or rashes.  Neurologic: Alert and oriented x 3.  Psych: Normal affect. Extremities: No clubbing or cyanosis.  HEENT: Normal.   1. Acute systolic CHF: Echo this admission with  EF 20-25%, moderate LV dilation, mild-moderate AS with mean gradient 14 mmHg and AVA 1.25 cm^2, mild AI, IVC normal.  He was admitted due to syncopal episode and does not appear significantly volume overloaded on exam.  Patient has history of untreated HTN as well as cocaine use. No strong FH of cardiomyopathy. It is possible that the cardiomyopathy is due to cocaine, but he also had coronary calcification on CTA chest.  NYHA class II symptoms at home.  BP mildly elevated at times, and he is not orthostatic.  - Start Entresto 24/26 bid.  - Start Farxiga 10 mg daily.  - Imperative to quit cocaine.  - Will need LHC/RHC, tentatively scheduled for Wednesday due to full schedule tomorrow.  Discussed risks/benefits and patient agrees to procedure.  2.  Syncope: Occurred on couch while watching TV, no prodrome.  He has not been  orthostatic here and BP running high.  He had episode of NSVT noted.  I worry that this was an arrhythmic event.  - Monitor on telemetry for further VT/NSVT.  - Lifevest at discharge if no significant arrhythmias found.  3. AKI versus CKD stage 3: Creatinine 1.5, baseline ?1.3. Follow with med titration.  Does not look like he needs aggressive diuresis.  4. Elevated troponin: Mild elevation with no trend.  No chest pain.  ?demand ischemia in setting of syncope.   - will be getting cath on Wednesday as above.  5. Aortic stenosis: Mild to moderate on echo.  Will need to follow going forward.  Do not think that this contributed to symptoms.  6. 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 Follow with time and control BP.  7. HTN: Will add GDMT to control.  Not on meds at home.  8. Cocaine use: Daily x yrs, says he quit 1 week ago.  - UDS. - Strongly encouraged him to quit.  9. Abdominal bloating: RUQ [place] to assess liver for cirrhosis and look for ascites.   Marca Ancona 12/18/2022 4:06 PM

## 2022-12-19 ENCOUNTER — Other Ambulatory Visit (HOSPITAL_COMMUNITY): Payer: Self-pay

## 2022-12-19 ENCOUNTER — Observation Stay (HOSPITAL_COMMUNITY): Payer: No Typology Code available for payment source

## 2022-12-19 DIAGNOSIS — I5021 Acute systolic (congestive) heart failure: Secondary | ICD-10-CM

## 2022-12-19 DIAGNOSIS — R7989 Other specified abnormal findings of blood chemistry: Secondary | ICD-10-CM | POA: Diagnosis not present

## 2022-12-19 DIAGNOSIS — Z96651 Presence of right artificial knee joint: Secondary | ICD-10-CM | POA: Diagnosis not present

## 2022-12-19 DIAGNOSIS — I2510 Atherosclerotic heart disease of native coronary artery without angina pectoris: Secondary | ICD-10-CM | POA: Diagnosis not present

## 2022-12-19 DIAGNOSIS — Z79899 Other long term (current) drug therapy: Secondary | ICD-10-CM | POA: Diagnosis not present

## 2022-12-19 DIAGNOSIS — I509 Heart failure, unspecified: Secondary | ICD-10-CM | POA: Diagnosis not present

## 2022-12-19 DIAGNOSIS — N289 Disorder of kidney and ureter, unspecified: Secondary | ICD-10-CM | POA: Diagnosis not present

## 2022-12-19 DIAGNOSIS — I517 Cardiomegaly: Secondary | ICD-10-CM | POA: Diagnosis not present

## 2022-12-19 DIAGNOSIS — N1831 Chronic kidney disease, stage 3a: Secondary | ICD-10-CM | POA: Diagnosis not present

## 2022-12-19 DIAGNOSIS — F1990 Other psychoactive substance use, unspecified, uncomplicated: Secondary | ICD-10-CM | POA: Diagnosis not present

## 2022-12-19 DIAGNOSIS — R55 Syncope and collapse: Secondary | ICD-10-CM | POA: Diagnosis not present

## 2022-12-19 DIAGNOSIS — I714 Abdominal aortic aneurysm, without rupture, unspecified: Secondary | ICD-10-CM | POA: Diagnosis not present

## 2022-12-19 DIAGNOSIS — E669 Obesity, unspecified: Secondary | ICD-10-CM | POA: Diagnosis not present

## 2022-12-19 DIAGNOSIS — I428 Other cardiomyopathies: Secondary | ICD-10-CM | POA: Diagnosis not present

## 2022-12-19 DIAGNOSIS — E876 Hypokalemia: Secondary | ICD-10-CM | POA: Diagnosis not present

## 2022-12-19 DIAGNOSIS — E785 Hyperlipidemia, unspecified: Secondary | ICD-10-CM | POA: Diagnosis not present

## 2022-12-19 DIAGNOSIS — I10 Essential (primary) hypertension: Secondary | ICD-10-CM | POA: Diagnosis not present

## 2022-12-19 DIAGNOSIS — I130 Hypertensive heart and chronic kidney disease with heart failure and stage 1 through stage 4 chronic kidney disease, or unspecified chronic kidney disease: Secondary | ICD-10-CM | POA: Diagnosis not present

## 2022-12-19 LAB — COMPREHENSIVE METABOLIC PANEL
ALT: 48 U/L — ABNORMAL HIGH (ref 0–44)
AST: 44 U/L — ABNORMAL HIGH (ref 15–41)
Albumin: 3.3 g/dL — ABNORMAL LOW (ref 3.5–5.0)
Alkaline Phosphatase: 39 U/L (ref 38–126)
Anion gap: 7 (ref 5–15)
BUN: 16 mg/dL (ref 8–23)
CO2: 24 mmol/L (ref 22–32)
Calcium: 9.9 mg/dL (ref 8.9–10.3)
Chloride: 104 mmol/L (ref 98–111)
Creatinine, Ser: 1.5 mg/dL — ABNORMAL HIGH (ref 0.61–1.24)
GFR, Estimated: 50 mL/min — ABNORMAL LOW (ref >60)
Glucose, Bld: 104 mg/dL — ABNORMAL HIGH (ref 70–99)
Potassium: 4.1 mmol/L (ref 3.5–5.1)
Sodium: 135 mmol/L (ref 135–145)
Total Bilirubin: 0.8 mg/dL (ref <1.2)
Total Protein: 6.3 g/dL — ABNORMAL LOW (ref 6.5–8.1)

## 2022-12-19 LAB — CBC
HCT: 49.3 % (ref 39.0–52.0)
Hemoglobin: 16.5 g/dL (ref 13.0–17.0)
MCH: 29.5 pg (ref 26.0–34.0)
MCHC: 33.5 g/dL (ref 30.0–36.0)
MCV: 88.2 fL (ref 80.0–100.0)
Platelets: 331 10*3/uL (ref 150–400)
RBC: 5.59 MIL/uL (ref 4.22–5.81)
RDW: 14.8 % (ref 11.5–15.5)
WBC: 9.7 10*3/uL (ref 4.0–10.5)
nRBC: 0 % (ref 0.0–0.2)

## 2022-12-19 LAB — MAGNESIUM: Magnesium: 2.3 mg/dL (ref 1.7–2.4)

## 2022-12-19 LAB — LIPASE, BLOOD: Lipase: 50 U/L (ref 11–51)

## 2022-12-19 MED ORDER — SACUBITRIL-VALSARTAN 49-51 MG PO TABS
1.0000 | ORAL_TABLET | Freq: Two times a day (BID) | ORAL | Status: DC
  Administered 2022-12-19 – 2022-12-20 (×3): 1 via ORAL
  Filled 2022-12-19 (×3): qty 1

## 2022-12-19 MED ORDER — ASPIRIN 81 MG PO CHEW
81.0000 mg | CHEWABLE_TABLET | ORAL | Status: AC
  Administered 2022-12-20: 81 mg via ORAL
  Filled 2022-12-19: qty 1

## 2022-12-19 MED ORDER — CARVEDILOL 3.125 MG PO TABS
3.1250 mg | ORAL_TABLET | Freq: Two times a day (BID) | ORAL | Status: DC
  Administered 2022-12-19 – 2022-12-21 (×5): 3.125 mg via ORAL
  Filled 2022-12-19 (×5): qty 1

## 2022-12-19 MED ORDER — SPIRONOLACTONE 12.5 MG HALF TABLET
12.5000 mg | ORAL_TABLET | Freq: Every day | ORAL | Status: DC
  Administered 2022-12-19 – 2022-12-20 (×2): 12.5 mg via ORAL
  Filled 2022-12-19 (×2): qty 1

## 2022-12-19 MED ORDER — SODIUM CHLORIDE 0.9% FLUSH
10.0000 mL | Freq: Two times a day (BID) | INTRAVENOUS | Status: DC
  Administered 2022-12-20: 3 mL via INTRAVENOUS

## 2022-12-19 MED ORDER — HYDRALAZINE HCL 20 MG/ML IJ SOLN: 10.0000 mg | INTRAMUSCULAR | Status: DC | PRN

## 2022-12-19 NOTE — Progress Notes (Signed)
PROGRESS NOTE  Travis Shea WGN:[id number] DOB: 14-Mar-1955   PCP: Myrlene Broker, MD  Patient is from: Home.  Independently ambulates at baseline.  DOA: 12/17/2022 LOS: 0  Chief complaints Chief Complaint  Patient presents with   Loss of Consciousness     Brief Narrative / Interim history: 67 year old M with PMH of CKD-3, HTN, GERD, cocaine use and alcohol use presenting with syncope while watching TV.  Had lightheadedness going on for about a month.  Admitted for syncope workup and found to have acute combined CHF with LVEF of 20 to 25% and G1 DD.  Advanced heart failure consulted and initiated GDMT.  Plan for [facility] on 11/6.   Subjective: Seen and examined earlier this morning.  No major events overnight of this morning.  No complaints other than some shortness of breath.  He denies orthopnea or PND.  Denies chest pain.  Denies dizziness.  Objective: Vitals:   12/19/22 0810 12/19/22 0900 12/19/22 0912 12/19/22 0956  BP: (!) 140/97 (!) 185/115 (!) 158/112 (!) 158/93  Pulse: 71 77    Resp: 12 16 (!) 24   Temp: 97.8 F (36.6 C) 97.6 F (36.4 C)    TempSrc: Oral Oral    SpO2: 99%     Weight:      Height:        Examination:  GENERAL: No apparent distress.  Nontoxic. HEENT: MMM.  Vision and hearing grossly intact.  NECK: Supple.  No apparent JVD.  RESP:  No IWOB.  Fair aeration bilaterally. CVS:  RRR. Heart sounds normal.  ABD/GI/GU: BS+. Abd soft, NTND.  MSK/EXT:  Moves extremities. No apparent deformity. No edema.  SKIN: no apparent skin lesion or wound NEURO: Awake, alert and oriented appropriately.  No apparent focal neuro deficit. PSYCH: Calm. Normal affect.   Procedures:  None  Microbiology summarized: None  Assessment and plan: Syncope and collapse: Likely due to heart failure.  History of cocaine use but denies recent use in 10 days.  Also reports alcohol use.  TTE with LVEF of 20 to 25%, G1-DD, mild AS and mildly reduced RVSF.  CT angio  negative for PE.  TSH normal. -Manage CHF as below -Continue telemetry monitoring -Encourage alcohol and cocaine cessation  Acute combined HF: TTE as above.  BNP normal.  Currently asymptomatic except for some shortness of breath.  Denies orthopnea or PND. Appears euvolemic.  Not on diuretics.  I&O incomplete. -Advanced heart failure team following and initiated GDMT with low-dose Entresto, Coreg, Aldactone and Farxiga -Plan for [facility] on 11/6. -Strict intake and output, daily weight, renal functions and electrolytes -Encouraged alcohol and cocaine cessation  Mild to mod aortic stenosis: Noted on TTE. -Per cardiology  NSVT: Noted on telemetry the day of admission -Continue telemetry monitoring -Optimize electrolytes -Advanced on Coreg by cardiology   Elevated troponin/coronary artery calcification: Noted on CTA chest.  Troponin elevation likely demand ischemia versus ACS.  No significant delta.  A1c 6.0% on 10/15.  LDL 95 in 05/2022 -Manage CHF as above -Plan for [facility]   Essential hypertension: BP elevated but improved. -Cardiac meds per cardiology  Polysubstance use: Admitted using cocaine, marijuana and alcohol -Counseled on the importance of cessation -Continue CIWA with as needed Ativan -Multivitamin, thiamine and folic acid -TOC consulted   CKD-3A: AKI ruled out.  Baseline Cr ~1.3. 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 -Recommend annual screening of the ascending thoracic in outpatient setting -Recommend 3-year screening noted for the mid abdominal aneurysm   Elevated liver function test: Likely congestive hepatopathy.  Improving. -Continue monitoring  Dyslipidemia: Labs from 4/11 noted total cholesterol 148, HDL  25.7, LDL 95, triglycerides 236.  -Start statin on discharge    GERD -PPI   Hypokalemia -Monitor replenish as appropriate    Obesity Body mass index is 31.14 kg/m.          DVT prophylaxis:  enoxaparin (LOVENOX) injection 40 mg Start: 12/18/22 1000 SCDs Start: 12/18/22 0507  Code Status: Full code Family Communication: None at bedside Level of care: Telemetry Cardiac Status is: Observation The patient will require care spanning > 2 midnights and should be moved to inpatient because: Acute heart failure, syncope   Final disposition: Home Consultants:  Advanced heart failure team  55 minutes with more than 50% spent in reviewing records, counseling patient/family and coordinating care.   Sch Meds:  Scheduled Meds:  carvedilol  3.125 mg Oral BID WC   dapagliflozin propanediol  10 mg Oral Daily   enoxaparin (LOVENOX) injection  40 mg Subcutaneous Daily   folic acid  1 mg Oral Daily   multivitamin with minerals  1 tablet Oral Daily   pantoprazole  40 mg Oral Daily   sacubitril-valsartan  1 tablet Oral BID   sodium chloride flush  3 mL Intravenous Q12H   spironolactone  12.5 mg Oral Daily   thiamine  100 mg Oral Daily   Or   thiamine  100 mg Intravenous Daily   Continuous Infusions: PRN Meds:.acetaminophen **OR** acetaminophen, albuterol, hydrALAZINE, LORazepam **OR** LORazepam, melatonin, ondansetron (ZOFRAN) IV, zolpidem  Antimicrobials: Anti-infectives (From admission, onward)    None        I have personally reviewed the following labs and images: CBC: Recent Labs  Lab 12/17/22 2226 12/18/22 0644 12/19/22 0659  WBC 9.0 9.3 9.7  NEUTROABS 5.8 5.9  --   HGB 15.0 15.1 16.5  HCT 45.2 47.0 49.3  MCV 88.6 93.1 88.2  PLT 309 250 331   BMP &GFR Recent Labs  Lab 12/17/22 2226 12/18/22 0644 12/19/22 0659  NA 135 135 135  K 3.4* 4.0 4.1  CL 102 104 104  CO2 22 23 24   GLUCOSE 133* 115* 104*  BUN 12 12 16   CREATININE 1.52* 1.52* 1.50*  CALCIUM 9.7 9.5 9.9  MG  --  2.0 2.3   Estimated Creatinine  Clearance: 53.8 mL/min (A) (by C-G formula based on SCr of 1.5 mg/dL (H)). Liver & Pancreas: Recent Labs  Lab 12/18/22 0644 12/19/22 0659  AST 51* 44*  ALT 51* 48*  ALKPHOS 36* 39  BILITOT 0.9 0.8  PROT 6.3* 6.3*  ALBUMIN 3.3* 3.3*   Recent Labs  Lab 12/19/22 0659  LIPASE 50   No results for input(s): "AMMONIA" in the last 168 hours. Diabetic: No results for input(s): "HGBA1C" in the last 72 hours. Recent Labs  Lab 12/17/22 2234  GLUCAP 145*   Cardiac Enzymes: No results for input(s): "CKTOTAL", "CKMB", "CKMBINDEX", "TROPONINI" in the last 168 hours. No results for input(s): "PROBNP" in the last 8760 hours. Coagulation Profile: No results for input(s): "INR", "PROTIME" in the last 168 hours. Thyroid Function Tests: Recent Labs    12/18/22 0644  TSH 1.870   Lipid Profile: No results for input(s): "CHOL", "HDL", "LDLCALC", "TRIG", "CHOLHDL", "LDLDIRECT" in the  last 72 hours. Anemia Panel: No results for input(s): "VITAMINB12", "FOLATE", "FERRITIN", "TIBC", "IRON", "RETICCTPCT" in the last 72 hours. Urine analysis:    Component Value Date/Time   COLORURINE YELLOW 12/18/2022 0027   APPEARANCEUR HAZY (A) 12/18/2022 0027   LABSPEC 1.024 12/18/2022 0027   PHURINE 5.0 12/18/2022 0027   GLUCOSEU NEGATIVE 12/18/2022 0027   HGBUR NEGATIVE 12/18/2022 0027   BILIRUBINUR NEGATIVE 12/18/2022 0027   KETONESUR NEGATIVE 12/18/2022 0027   PROTEINUR NEGATIVE 12/18/2022 0027   NITRITE NEGATIVE 12/18/2022 0027   LEUKOCYTESUR NEGATIVE 12/18/2022 0027   Sepsis Labs: Invalid input(s): "PROCALCITONIN", "LACTICIDVEN"  Microbiology: No results found for this or any previous visit (from the past 240 hour(s)).  Radiology Studies: US Abdomen Limited RUQ (LIVER/GB)  Result Date: 12/19/2022 CLINICAL DATA:  Heart failure EXAM: ULTRASOUND ABDOMEN LIMITED RIGHT UPPER QUADRANT COMPARISON:  06/29/2022 FINDINGS: Gallbladder: No gallstones or wall thickening visualized. No sonographic Murphy  sign noted by sonographer. Common bile duct: Diameter: Normal caliber, 5 mm Liver: No focal lesion identified. Within normal limits in parenchymal echogenicity. Portal vein is patent on color Doppler imaging with normal direction of blood flow towards the liver. Other: None. IMPRESSION: Normal study. Electronically Signed   By: Charlett Nose M.D.   On: 12/19/2022 03:58      Carlita Whitcomb T. Giovannie Scerbo [facility]  If 7PM-7AM, please contact night-coverage [email or web address] 12/19/2022, 11:00 AM

## 2022-12-19 NOTE — H&P (View-Only) (Signed)
Advanced Heart Failure Rounding Note  PCP-Cardiologist: None   Subjective:    Hypertensive this am 150-160/110s.  Adding GDMT. LHC/RHC tomorrow.   Feeling good this morning. Sitting up in chair. Reports mild orthopnea overnight, describes as baseline.  Objective:   Weight Range: 95.7 kg Body mass index is 31.14 kg/m.   Vital Signs:   Temp:  [97.6 F (36.4 C)-97.9 F (36.6 C)] 97.6 F (36.4 C) (11/05 0900) Pulse Rate:  [71-82] 77 (11/05 0900) Resp:  [12-24] 24 (11/05 0912) BP: (122-185)/(63-115) 158/112 (11/05 0912) SpO2:  [96 %-100 %] 99 % (11/05 0810) Weight:  [95.7 kg] 95.7 kg (11/05 0330) Last BM Date : 12/18/22  Weight change: Filed Weights   12/17/22 2222 12/19/22 0330  Weight: 95.3 kg 95.7 kg    Intake/Output:   Physical Exam    General:  Well appearing. No resp difficulty. Large muscle mass. HEENT: Normal Neck: Supple. No JVd . Carotids 2+ bilat; no bruits. No lymphadenopathy or thyromegaly appreciated. Cor: PMI nondisplaced. Regular rate & rhythm. No rubs, gallops or murmurs. Lungs: Clear Abdomen: Soft, nontender, nondistended. No hepatosplenomegaly. No bruits or masses. Good bowel sounds. Extremities: No cyanosis, clubbing, rash, edema Neuro: Alert & orientedx3, cranial nerves grossly intact. moves all 4 extremities w/o difficulty. Affect pleasant  Telemetry   SR 80-90s (personally reviewed)  EKG    EKG ordered. ST depression noted on Telemetry.  Labs    CBC Recent Labs    12/17/22 2226 12/18/22 0644 12/19/22 0659  WBC 9.0 9.3 9.7  NEUTROABS 5.8 5.9  --   HGB 15.0 15.1 16.5  HCT 45.2 47.0 49.3  MCV 88.6 93.1 88.2  PLT 309 250 331   Basic Metabolic Panel Recent Labs    78/46/96 0644 12/19/22 0659  NA 135 135  K 4.0 4.1  CL 104 104  CO2 23 24  GLUCOSE 115* 104*  BUN 12 16  CREATININE 1.52* 1.50*  CALCIUM 9.5 9.9  MG 2.0 2.3   Liver Function Tests Recent Labs    12/18/22 0644 12/19/22 0659  AST 51* 44*  ALT 51*  48*  ALKPHOS 36* 39  BILITOT 0.9 0.8  PROT 6.3* 6.3*  ALBUMIN 3.3* 3.3*   Recent Labs    12/19/22 0659  LIPASE 50   Cardiac Enzymes No results for input(s): "CKTOTAL", "CKMB", "CKMBINDEX", "TROPONINI" in the last 72 hours.  BNP: BNP (last 3 results) Recent Labs    12/18/22 0644  BNP 75.7    ProBNP (last 3 results) No results for input(s): "PROBNP" in the last 8760 hours.   D-Dimer No results for input(s): "DDIMER" in the last 72 hours. Hemoglobin A1C No results for input(s): "HGBA1C" in the last 72 hours. Fasting Lipid Panel No results for input(s): "CHOL", "HDL", "LDLCALC", "TRIG", "CHOLHDL", "LDLDIRECT" in the last 72 hours. Thyroid Function Tests Recent Labs    12/18/22 0644  TSH 1.870    Other results:   Imaging    US Abdomen Limited RUQ (LIVER/GB)  Result Date: 12/19/2022 CLINICAL DATA:  Heart failure EXAM: ULTRASOUND ABDOMEN LIMITED RIGHT UPPER QUADRANT COMPARISON:  06/29/2022 FINDINGS: Gallbladder: No gallstones or wall thickening visualized. No sonographic Murphy sign noted by sonographer. Common bile duct: Diameter: Normal caliber, 5 mm Liver: No focal lesion identified. Within normal limits in parenchymal echogenicity. Portal vein is patent on color Doppler imaging with normal direction of blood flow towards the liver. Other: None. IMPRESSION: Normal study. Electronically Signed   By: Charlett Nose M.D.   On:  12/19/2022 03:58     Medications:     Scheduled Medications:  dapagliflozin propanediol  10 mg Oral Daily   enoxaparin (LOVENOX) injection  40 mg Subcutaneous Daily   folic acid  1 mg Oral Daily   multivitamin with minerals  1 tablet Oral Daily   pantoprazole  40 mg Oral Daily   sacubitril-valsartan  1 tablet Oral BID   sodium chloride flush  3 mL Intravenous Q12H   spironolactone  12.5 mg Oral Daily   thiamine  100 mg Oral Daily   Or   thiamine  100 mg Intravenous Daily    Infusions:   PRN Medications: acetaminophen **OR**  acetaminophen, albuterol, hydrALAZINE, LORazepam **OR** LORazepam, melatonin, ondansetron (ZOFRAN) IV, zolpidem    Patient Profile   Travis Shea is a 67 yo 67 yo male with past medical history of HTN, GERD, and osteoarthritis. Admitted for syncope and acute systolic heart failure.   Assessment/Plan   1. Acute Systolic Heart Failure: Echo this admission with EF 20-25%, moderate LV dilation, mild-moderate AS with mean gradient 14 mmHg and AVA 1.25 cm^2, mild AI, IVC normal. He was admitted due to syncopal episode and does not appear significantly volume overloaded on exam. Patient has history of untreated HTN as well as cocaine use. No strong FH of cardiomyopathy. It is possible that the cardiomyopathy is due to cocaine, but he also had coronary calcification on CTA chest. NYHA class II symptoms at home. BP mildly elevated at times, and he is not orthostatic.  - RHC/LHC Wednesday - Appears euvolemic on exam - Increase Entresto 49-51 mg BID - Start Farxiga 10 mg  - Start Spiro 12.5 mg  - Start Coreg 3.125 mg  - Renal function stable. Monitor closely.   2. Syncope: occurred on couch while watching TV, no prodrome.  He has not been orthostatic here and BP running high.  He had episode of NSVT noted.  I worry that this was an arrhythmic event.  - Monitor on telemetry for further VT/NSVT.  - Lifevest at discharge if no significant arrhythmias found  3. Aortic Stenosis - mild to moderate noted on ECHO 11/4   4. AKI vs CKD IIIa: baseline appears to be ~1.3. sCr on admission 1.5 - stable at 1.5 today - Follow BMET closely   5. NSVT - 9 beat run noted on tele yesterday; none noted - EKG this morning, ST depression on tele - Lifevest as discharge if no significant arrythmic event  5. HTN - SBP 150-160/110s this morning - GDMT to control - Increase Entresto 49-51 today - Start Coreg 3.125 mg    6. Dyslipidemia - TGL 236, VLDL 47.2, HDL 25.7   7. Thoracic/Abdominal Aortic Aneurysm   - 4.3 cm ascending aortic aneurysm noted on CT chest - 3.3 cm abdominal aortic aneurism noted on CT - Continue outpatient surveillance, per guidelines - Tight BP control   8. ETOH use - cessation education   9. Cocaine use - history of use for years, last used 10 days ago - Endorses he is attempting to quit; strongly encouraged - USD pending    10. GERD/Abdominal bloating - Continue Protonix - AST/ALT mildly elevated, improving - RUQ [place] normal  Length of Stay: 0  Travis Lee, NP  12/19/2022, 9:38 AM  Advanced Heart Failure Team Pager [phone] (M-F; 7a - 5p)  Please contact [facility] for night-coverage after hours (5p -7a ) and weekends on [email or web address]   Patient seen with NP, agree with the above note.  Feels like breathing is a little shallow.  No chest pain.  Remains hypertensive.  Creatinine stable at 1.5.  Telemetry reviewed, no events.   General: NAD Neck: No JVD, no thyromegaly or thyroid nodule.  Lungs: Clear to auscultation bilaterally with normal respiratory effort. CV: Nondisplaced PMI.  Heart regular S1/S2, no S3/S4, no murmur.  No peripheral edema.   Abdomen: Soft, nontender, no hepatosplenomegaly, no distention.  Skin: Intact without lesions or rashes.  Neurologic: Alert and oriented x 3.  Psych: Normal affect. Extremities: No clubbing or cyanosis.  HEENT: Normal.   1. Acute systolic CHF: Echo this admission with  EF 20-25%, moderate LV dilation, mild-moderate AS with mean gradient 14 mmHg and AVA 1.25 cm^2, mild AI, IVC normal.  He was admitted due to syncopal episode and does not appear significantly volume overloaded on exam.  Patient has history of untreated HTN as well as cocaine use. No strong FH of cardiomyopathy. It is possible that the cardiomyopathy is due to cocaine, but he also had coronary calcification on CTA chest.  NYHA class II symptoms at home.  BP remains elevated.  - Increase Entresto to 49/51 bid.  - Continue Farxiga 10 mg daily.  -  Can start Coreg 3.125 mg bid.  - Start spironolactone 12.5 daily.  - Imperative to quit cocaine.  - Will need LHC/RHC, scheduled for tomorrow.  Discussed risks/benefits and patient agrees to procedure.  2.  Syncope: Occurred on couch while watching TV, no prodrome.  He has not been orthostatic here and BP running high.  He had episode of NSVT noted.  I worry that this was an arrhythmic event. No events on telemetry overnight.  - Monitor on telemetry for further VT/NSVT.  - Lifevest at discharge if no significant arrhythmias found.  3. AKI versus CKD stage 3: Creatinine 1.5, baseline ?1.3. Follow with med titration.  Does not look like he needs aggressive diuresis.  4. Elevated troponin: Mild elevation with no trend.  No chest pain.  ?demand ischemia in setting of syncope.   - will be getting cath on Wednesday as above.  - With coronary calcification, will need statin.  Will check lipid profile in am.  5. Aortic stenosis: Mild to moderate on echo.  Will need to follow going forward.  Do not think that this contributed to symptoms.  6. 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 Follow with time and control BP.  7. HTN: Adding GDMT to control.  Not on meds at home.  8. Cocaine use: Daily x yrs, says he quit 1 week ago.  - Strongly encouraged him to quit.  9. Abdominal bloating: RUQ US showed no cirrhosis or ascites.   Travis Shea 12/19/2022 11:14 AM

## 2022-12-19 NOTE — TOC Benefit Eligibility Note (Signed)
Patient Product/process development scientist completed.    The patient is insured through [employer]. Patient has Medicare and is not eligible for a copay card, but may be able to apply for patient assistance, if available.    Ran test claim for Entresto 24-26 mg and the current 30 day co-pay is $356.95 due to a $395.00 deductible.  Will be $47.00 once deductible is met.  Ran test claim for Farxiga 10 mg and the current 30 day co-pay is $356.95 due to a $395.00 deductible.  Will be $47.00 once deductible is met.  Ran test claim for Jardiance 10 mg and the current 30 day co-pay is $356.95 due to a $395.00 deductible.  Will be $47.00 once deductible is met.  This test claim was processed through [facility]- copay amounts may vary at other pharmacies due to pharmacy/plan contracts, or as the patient moves through the different stages of their insurance plan.     Travis Shea, CPHT Pharmacy Technician III Certified Patient Advocate [facility] Patient Advocate Team Direct Number: [phone]  Fax: [phone]

## 2022-12-19 NOTE — Progress Notes (Addendum)
Advanced Heart Failure Rounding Note  PCP-Cardiologist: None   Subjective:    Hypertensive this am 150-160/110s.  Adding GDMT. LHC/RHC tomorrow.   Feeling good this morning. Sitting up in chair. Reports mild orthopnea overnight, describes as baseline.  Objective:   Weight Range: 95.7 kg Body mass index is 31.14 kg/m.   Vital Signs:   Temp:  [97.6 F (36.4 C)-97.9 F (36.6 C)] 97.6 F (36.4 C) (11/05 0900) Pulse Rate:  [71-82] 77 (11/05 0900) Resp:  [12-24] 24 (11/05 0912) BP: (122-185)/(63-115) 158/112 (11/05 0912) SpO2:  [96 %-100 %] 99 % (11/05 0810) Weight:  [95.7 kg] 95.7 kg (11/05 0330) Last BM Date : 12/18/22  Weight change: Filed Weights   12/17/22 2222 12/19/22 0330  Weight: 95.3 kg 95.7 kg    Intake/Output:   Physical Exam    General:  Well appearing. No resp difficulty. Large muscle mass. HEENT: Normal Neck: Supple. No JVd . Carotids 2+ bilat; no bruits. No lymphadenopathy or thyromegaly appreciated. Cor: PMI nondisplaced. Regular rate & rhythm. No rubs, gallops or murmurs. Lungs: Clear Abdomen: Soft, nontender, nondistended. No hepatosplenomegaly. No bruits or masses. Good bowel sounds. Extremities: No cyanosis, clubbing, rash, edema Neuro: Alert & orientedx3, cranial nerves grossly intact. moves all 4 extremities w/o difficulty. Affect pleasant  Telemetry   SR 80-90s (personally reviewed)  EKG    EKG ordered. ST depression noted on Telemetry.  Labs    CBC Recent Labs    12/17/22 2226 12/18/22 0644 12/19/22 0659  WBC 9.0 9.3 9.7  NEUTROABS 5.8 5.9  --   HGB 15.0 15.1 16.5  HCT 45.2 47.0 49.3  MCV 88.6 93.1 88.2  PLT 309 250 331   Basic Metabolic Panel Recent Labs    78/46/96 0644 12/19/22 0659  NA 135 135  K 4.0 4.1  CL 104 104  CO2 23 24  GLUCOSE 115* 104*  BUN 12 16  CREATININE 1.52* 1.50*  CALCIUM 9.5 9.9  MG 2.0 2.3   Liver Function Tests Recent Labs    12/18/22 0644 12/19/22 0659  AST 51* 44*  ALT 51*  48*  ALKPHOS 36* 39  BILITOT 0.9 0.8  PROT 6.3* 6.3*  ALBUMIN 3.3* 3.3*   Recent Labs    12/19/22 0659  LIPASE 50   Cardiac Enzymes No results for input(s): "CKTOTAL", "CKMB", "CKMBINDEX", "TROPONINI" in the last 72 hours.  BNP: BNP (last 3 results) Recent Labs    12/18/22 0644  BNP 75.7    ProBNP (last 3 results) No results for input(s): "PROBNP" in the last 8760 hours.   D-Dimer No results for input(s): "DDIMER" in the last 72 hours. Hemoglobin A1C No results for input(s): "HGBA1C" in the last 72 hours. Fasting Lipid Panel No results for input(s): "CHOL", "HDL", "LDLCALC", "TRIG", "CHOLHDL", "LDLDIRECT" in the last 72 hours. Thyroid Function Tests Recent Labs    12/18/22 0644  TSH 1.870    Other results:   Imaging    US Abdomen Limited RUQ (LIVER/GB)  Result Date: 12/19/2022 CLINICAL DATA:  Heart failure EXAM: ULTRASOUND ABDOMEN LIMITED RIGHT UPPER QUADRANT COMPARISON:  06/29/2022 FINDINGS: Gallbladder: No gallstones or wall thickening visualized. No sonographic Murphy sign noted by sonographer. Common bile duct: Diameter: Normal caliber, 5 mm Liver: No focal lesion identified. Within normal limits in parenchymal echogenicity. Portal vein is patent on color Doppler imaging with normal direction of blood flow towards the liver. Other: None. IMPRESSION: Normal study. Electronically Signed   By: Charlett Nose M.D.   On:  12/19/2022 03:58     Medications:     Scheduled Medications:  dapagliflozin propanediol  10 mg Oral Daily   enoxaparin (LOVENOX) injection  40 mg Subcutaneous Daily   folic acid  1 mg Oral Daily   multivitamin with minerals  1 tablet Oral Daily   pantoprazole  40 mg Oral Daily   sacubitril-valsartan  1 tablet Oral BID   sodium chloride flush  3 mL Intravenous Q12H   spironolactone  12.5 mg Oral Daily   thiamine  100 mg Oral Daily   Or   thiamine  100 mg Intravenous Daily    Infusions:   PRN Medications: acetaminophen **OR**  acetaminophen, albuterol, hydrALAZINE, LORazepam **OR** LORazepam, melatonin, ondansetron (ZOFRAN) IV, zolpidem    Patient Profile   Travis Shea is a 67 yo 67 yo male with past medical history of HTN, GERD, and osteoarthritis. Admitted for syncope and acute systolic heart failure.   Assessment/Plan   1. Acute Systolic Heart Failure: Echo this admission with EF 20-25%, moderate LV dilation, mild-moderate AS with mean gradient 14 mmHg and AVA 1.25 cm^2, mild AI, IVC normal. He was admitted due to syncopal episode and does not appear significantly volume overloaded on exam. Patient has history of untreated HTN as well as cocaine use. No strong FH of cardiomyopathy. It is possible that the cardiomyopathy is due to cocaine, but he also had coronary calcification on CTA chest. NYHA class II symptoms at home. BP mildly elevated at times, and he is not orthostatic.  - RHC/LHC Wednesday - Appears euvolemic on exam - Increase Entresto 49-51 mg BID - Start Farxiga 10 mg  - Start Spiro 12.5 mg  - Start Coreg 3.125 mg  - Renal function stable. Monitor closely.   2. Syncope: occurred on couch while watching TV, no prodrome.  He has not been orthostatic here and BP running high.  He had episode of NSVT noted.  I worry that this was an arrhythmic event.  - Monitor on telemetry for further VT/NSVT.  - Lifevest at discharge if no significant arrhythmias found  3. Aortic Stenosis - mild to moderate noted on ECHO 11/4   4. AKI vs CKD IIIa: baseline appears to be ~1.3. sCr on admission 1.5 - stable at 1.5 today - Follow BMET closely   5. NSVT - 9 beat run noted on tele yesterday; none noted - EKG this morning, ST depression on tele - Lifevest as discharge if no significant arrythmic event  5. HTN - SBP 150-160/110s this morning - GDMT to control - Increase Entresto 49-51 today - Start Coreg 3.125 mg    6. Dyslipidemia - TGL 236, VLDL 47.2, HDL 25.7   7. Thoracic/Abdominal Aortic Aneurysm   - 4.3 cm ascending aortic aneurysm noted on CT chest - 3.3 cm abdominal aortic aneurism noted on CT - Continue outpatient surveillance, per guidelines - Tight BP control   8. ETOH use - cessation education   9. Cocaine use - history of use for years, last used 10 days ago - Endorses he is attempting to quit; strongly encouraged - USD pending    10. GERD/Abdominal bloating - Continue Protonix - AST/ALT mildly elevated, improving - RUQ [place] normal  Length of Stay: 0  [place] Lee, NP  12/19/2022, 9:38 AM  Advanced Heart Failure Team Pager [phone] (M-F; 7a - 5p)  Please contact [facility] for night-coverage after hours (5p -7a ) and weekends on [email or web address]   Patient seen with NP, agree with the above note.  Feels like breathing is a little shallow.  No chest pain.  Remains hypertensive.  Creatinine stable at 1.5.  Telemetry reviewed, no events.   General: NAD Neck: No JVD, no thyromegaly or thyroid nodule.  Lungs: Clear to auscultation bilaterally with normal respiratory effort. CV: Nondisplaced PMI.  Heart regular S1/S2, no S3/S4, no murmur.  No peripheral edema.   Abdomen: Soft, nontender, no hepatosplenomegaly, no distention.  Skin: Intact without lesions or rashes.  Neurologic: Alert and oriented x 3.  Psych: Normal affect. Extremities: No clubbing or cyanosis.  HEENT: Normal.   1. Acute systolic CHF: Echo this admission with  EF 20-25%, moderate LV dilation, mild-moderate AS with mean gradient 14 mmHg and AVA 1.25 cm^2, mild AI, IVC normal.  He was admitted due to syncopal episode and does not appear significantly volume overloaded on exam.  Patient has history of untreated HTN as well as cocaine use. No strong FH of cardiomyopathy. It is possible that the cardiomyopathy is due to cocaine, but he also had coronary calcification on CTA chest.  NYHA class II symptoms at home.  BP remains elevated.  - Increase Entresto to 49/51 bid.  - Continue Farxiga 10 mg daily.  -  Can start Coreg 3.125 mg bid.  - Start spironolactone 12.5 daily.  - Imperative to quit cocaine.  - Will need LHC/RHC, scheduled for tomorrow.  Discussed risks/benefits and patient agrees to procedure.  2.  Syncope: Occurred on couch while watching TV, no prodrome.  He has not been orthostatic here and BP running high.  He had episode of NSVT noted.  I worry that this was an arrhythmic event. No events on telemetry overnight.  - Monitor on telemetry for further VT/NSVT.  - Lifevest at discharge if no significant arrhythmias found.  3. AKI versus CKD stage 3: Creatinine 1.5, baseline ?1.3. Follow with med titration.  Does not look like he needs aggressive diuresis.  4. Elevated troponin: Mild elevation with no trend.  No chest pain.  ?demand ischemia in setting of syncope.   - will be getting cath on Wednesday as above.  - With coronary calcification, will need statin.  Will check lipid profile in am.  5. Aortic stenosis: Mild to moderate on echo.  Will need to follow going forward.  Do not think that this contributed to symptoms.  6. 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 Follow with time and control BP.  7. HTN: Adding GDMT to control.  Not on meds at home.  8. Cocaine use: Daily x yrs, says he quit 1 week ago.  - Strongly encouraged him to quit.  9. Abdominal bloating: RUQ US showed no cirrhosis or ascites.   Marca Ancona 12/19/2022 11:14 AM

## 2022-12-19 NOTE — TOC Initial Note (Signed)
Transition of Care [facility]) - Initial/Assessment Note    Patient Details  Name: Travis Shea MRN: [id number] Date of Birth: 08-Jul-1955  Transition of Care [facility]) CM/SW Contact:    Nicanor Bake Phone Number: [phone] 12/19/2022, 2:54 PM  Clinical Narrative:    HF CSW met with pt and wife at bedside. Pt stated that he and his wife live alone. Pt stated that he has no history of HH services. Pt stated that he does not use any equipment. Pt stated that they have a scale at home. Pt stated he receives SSI. Pt does not work. Pt and CSW discussed the SA consult. Pt stated that he does not need help and will figure it out on his own and declined education and resources. Pt stated that he would like for his SA concerns not to be discussed further for particular reasons. CSW explained that a follow up hospital appointment will be scheduled with his PCP closer to dc.   TOC will continue following.                      Patient Goals and CMS Choice            Expected Discharge Plan and Services                                              Prior Living Arrangements/Services                       Activities of Daily Living      Permission Sought/Granted                  Emotional Assessment              Admission diagnosis:  Syncope and collapse [R55] Syncope [R55] Elevated troponin [R79.89] Patient Active Problem List   Diagnosis Date Noted   Syncope 12/18/2022   Elevated troponin 12/18/2022   Coronary artery calcification 12/18/2022   Hypokalemia 12/18/2022   Cardiomegaly 12/18/2022   Renal insufficiency 12/18/2022   AAA (abdominal aortic aneurysm) (HCC) 12/18/2022   Elevated liver function tests 12/18/2022   Dyslipidemia 12/18/2022   Obesity (BMI 30-39.9) 12/18/2022   Polysubstance use disorder 12/18/2022   Subacute cough 11/29/2022   Essential hypertension 05/26/2022   Other fatigue 05/26/2022   GERD (gastroesophageal  reflux disease) 05/26/2022   PCP:  Myrlene Broker, MD Pharmacy:   [facility] [address] [place] [address] Phone: [phone] Fax: [phone]     Social Determinants of Health (SDOH) Social History: SDOH Screenings   Food Insecurity: Patient Declined (12/19/2022)  Housing: High Risk (12/19/2022)  Transportation Needs: No Transportation Needs (11/28/2022)  Utilities: Patient Declined (12/19/2022)  Alcohol Screen: Low Risk  (12/19/2022)  Depression (PHQ2-9): Low Risk  (11/28/2022)  Financial Resource Strain: Patient Declined (11/28/2022)  Physical Activity: Sufficiently Active (11/28/2022)  Social Connections: Moderately Isolated (11/28/2022)  Stress: No Stress Concern Present (11/28/2022)  Tobacco Use: Low Risk  (12/18/2022)  Health Literacy: Adequate Health Literacy (12/19/2022)   SDOH Interventions: Alcohol Usage Interventions: Patient Declined   Readmission Risk Interventions     No data to display

## 2022-12-20 ENCOUNTER — Ambulatory Visit (HOSPITAL_COMMUNITY)
Admission: RE | Admit: 2022-12-20 | Payer: No Typology Code available for payment source | Source: Home / Self Care | Admitting: Cardiology

## 2022-12-20 ENCOUNTER — Encounter (HOSPITAL_COMMUNITY)
Admission: EM | Disposition: A | Discharge status: Home / Self Care | Payer: Self-pay | Source: Home / Self Care | Attending: Emergency Medicine

## 2022-12-20 ENCOUNTER — Other Ambulatory Visit (HOSPITAL_COMMUNITY): Payer: Self-pay

## 2022-12-20 ENCOUNTER — Telehealth (HOSPITAL_COMMUNITY): Payer: Self-pay | Admitting: Pharmacy Technician

## 2022-12-20 ENCOUNTER — Observation Stay (HOSPITAL_COMMUNITY): Payer: No Typology Code available for payment source

## 2022-12-20 DIAGNOSIS — R7989 Other specified abnormal findings of blood chemistry: Secondary | ICD-10-CM | POA: Diagnosis not present

## 2022-12-20 DIAGNOSIS — R55 Syncope and collapse: Secondary | ICD-10-CM | POA: Diagnosis not present

## 2022-12-20 DIAGNOSIS — I5021 Acute systolic (congestive) heart failure: Secondary | ICD-10-CM | POA: Diagnosis not present

## 2022-12-20 DIAGNOSIS — F1990 Other psychoactive substance use, unspecified, uncomplicated: Secondary | ICD-10-CM | POA: Diagnosis not present

## 2022-12-20 DIAGNOSIS — E876 Hypokalemia: Secondary | ICD-10-CM | POA: Diagnosis not present

## 2022-12-20 DIAGNOSIS — I429 Cardiomyopathy, unspecified: Secondary | ICD-10-CM | POA: Diagnosis not present

## 2022-12-20 DIAGNOSIS — E669 Obesity, unspecified: Secondary | ICD-10-CM | POA: Diagnosis not present

## 2022-12-20 DIAGNOSIS — I517 Cardiomegaly: Secondary | ICD-10-CM | POA: Diagnosis not present

## 2022-12-20 DIAGNOSIS — I10 Essential (primary) hypertension: Secondary | ICD-10-CM | POA: Diagnosis not present

## 2022-12-20 DIAGNOSIS — I351 Nonrheumatic aortic (valve) insufficiency: Secondary | ICD-10-CM | POA: Diagnosis not present

## 2022-12-20 DIAGNOSIS — I5042 Chronic combined systolic (congestive) and diastolic (congestive) heart failure: Secondary | ICD-10-CM

## 2022-12-20 DIAGNOSIS — E785 Hyperlipidemia, unspecified: Secondary | ICD-10-CM | POA: Diagnosis not present

## 2022-12-20 DIAGNOSIS — N289 Disorder of kidney and ureter, unspecified: Secondary | ICD-10-CM | POA: Diagnosis not present

## 2022-12-20 DIAGNOSIS — I2510 Atherosclerotic heart disease of native coronary artery without angina pectoris: Secondary | ICD-10-CM | POA: Diagnosis not present

## 2022-12-20 DIAGNOSIS — I714 Abdominal aortic aneurysm, without rupture, unspecified: Secondary | ICD-10-CM | POA: Diagnosis not present

## 2022-12-20 DIAGNOSIS — I5043 Acute on chronic combined systolic (congestive) and diastolic (congestive) heart failure: Secondary | ICD-10-CM

## 2022-12-20 HISTORY — PX: RIGHT/LEFT HEART CATH AND CORONARY ANGIOGRAPHY: CATH118266

## 2022-12-20 LAB — BASIC METABOLIC PANEL
Anion gap: 8 (ref 5–15)
BUN: 23 mg/dL (ref 8–23)
CO2: 25 mmol/L (ref 22–32)
Calcium: 9.6 mg/dL (ref 8.9–10.3)
Chloride: 103 mmol/L (ref 98–111)
Creatinine, Ser: 1.54 mg/dL — ABNORMAL HIGH (ref 0.61–1.24)
GFR, Estimated: 49 mL/min — ABNORMAL LOW (ref >60)
Glucose, Bld: 103 mg/dL — ABNORMAL HIGH (ref 70–99)
Potassium: 4.3 mmol/L (ref 3.5–5.1)
Sodium: 136 mmol/L (ref 135–145)

## 2022-12-20 LAB — POCT I-STAT EG7
Acid-base deficit: 2 mmol/L (ref 0.0–2.0)
Acid-base deficit: 4 mmol/L — ABNORMAL HIGH (ref 0.0–2.0)
Bicarbonate: 21.1 mmol/L (ref 20.0–28.0)
Bicarbonate: 22.7 mmol/L (ref 20.0–28.0)
Calcium, Ion: 1.19 mmol/L (ref 1.15–1.40)
Calcium, Ion: 1.3 mmol/L (ref 1.15–1.40)
HCT: 54 % — ABNORMAL HIGH (ref 39.0–52.0)
HCT: 55 % — ABNORMAL HIGH (ref 39.0–52.0)
Hemoglobin: 18.4 g/dL — ABNORMAL HIGH (ref 13.0–17.0)
Hemoglobin: 18.7 g/dL — ABNORMAL HIGH (ref 13.0–17.0)
O2 Saturation: 61 %
O2 Saturation: 63 %
Potassium: 4 mmol/L (ref 3.5–5.1)
Potassium: 4.3 mmol/L (ref 3.5–5.1)
Sodium: 135 mmol/L (ref 135–145)
Sodium: 137 mmol/L (ref 135–145)
TCO2: 22 mmol/L (ref 22–32)
TCO2: 24 mmol/L (ref 22–32)
pCO2, Ven: 37.9 mm[Hg] — ABNORMAL LOW (ref 44–60)
pCO2, Ven: 39 mm[Hg] — ABNORMAL LOW (ref 44–60)
pH, Ven: 7.354 (ref 7.25–7.43)
pH, Ven: 7.373 (ref 7.25–7.43)
pO2, Ven: 32 mm[Hg] (ref 32–45)
pO2, Ven: 34 mm[Hg] (ref 32–45)

## 2022-12-20 LAB — LIPID PANEL
Cholesterol: 202 mg/dL — ABNORMAL HIGH (ref 0–200)
HDL: 28 mg/dL — ABNORMAL LOW (ref >40)
LDL Cholesterol: 102 mg/dL — ABNORMAL HIGH (ref 0–99)
Total CHOL/HDL Ratio: 7.2 {ratio}
Triglycerides: 361 mg/dL — ABNORMAL HIGH (ref <150)
VLDL: 72 mg/dL — ABNORMAL HIGH (ref 0–40)

## 2022-12-20 LAB — CBC
HCT: 51.2 % (ref 39.0–52.0)
Hemoglobin: 17.1 g/dL — ABNORMAL HIGH (ref 13.0–17.0)
MCH: 28.8 pg (ref 26.0–34.0)
MCHC: 33.4 g/dL (ref 30.0–36.0)
MCV: 86.3 fL (ref 80.0–100.0)
Platelets: 363 10*3/uL (ref 150–400)
RBC: 5.93 MIL/uL — ABNORMAL HIGH (ref 4.22–5.81)
RDW: 14.7 % (ref 11.5–15.5)
WBC: 11.7 10*3/uL — ABNORMAL HIGH (ref 4.0–10.5)
nRBC: 0 % (ref 0.0–0.2)

## 2022-12-20 LAB — CREATININE, SERUM
Creatinine, Ser: 1.29 mg/dL — ABNORMAL HIGH (ref 0.61–1.24)
GFR, Estimated: >60 mL/min (ref >60)

## 2022-12-20 SURGERY — RIGHT/LEFT HEART CATH AND CORONARY ANGIOGRAPHY
Anesthesia: LOCAL

## 2022-12-20 MED ORDER — LABETALOL HCL 5 MG/ML IV SOLN
10.0000 mg | INTRAVENOUS | Status: AC | PRN
Start: 2022-12-20 — End: 2022-12-20

## 2022-12-20 MED ORDER — SODIUM CHLORIDE 0.9 % IV SOLN: INTRAVENOUS | Status: AC

## 2022-12-20 MED ORDER — LIDOCAINE HCL (PF) 1 % IJ SOLN
INTRAMUSCULAR | Status: AC
  Filled 2022-12-20: qty 30

## 2022-12-20 MED ORDER — FENTANYL CITRATE (PF) 100 MCG/2ML IJ SOLN
INTRAMUSCULAR | Status: DC | PRN
  Administered 2022-12-20 (×2): 25 ug via INTRAVENOUS

## 2022-12-20 MED ORDER — MIDAZOLAM HCL 2 MG/2ML IJ SOLN
INTRAMUSCULAR | Status: DC | PRN
  Administered 2022-12-20: .5 mg via INTRAVENOUS
  Administered 2022-12-20: 1 mg via INTRAVENOUS

## 2022-12-20 MED ORDER — SACUBITRIL-VALSARTAN 97-103 MG PO TABS
1.0000 | ORAL_TABLET | Freq: Two times a day (BID) | ORAL | Status: DC
  Administered 2022-12-20 – 2022-12-21 (×2): 1 via ORAL
  Filled 2022-12-20 (×4): qty 1

## 2022-12-20 MED ORDER — ENOXAPARIN SODIUM 40 MG/0.4ML IJ SOSY
40.0000 mg | PREFILLED_SYRINGE | INTRAMUSCULAR | Status: DC
  Administered 2022-12-21: 40 mg via SUBCUTANEOUS
  Filled 2022-12-20: qty 0.4

## 2022-12-20 MED ORDER — HEPARIN SODIUM (PORCINE) 1000 UNIT/ML IJ SOLN
INTRAMUSCULAR | Status: DC | PRN
  Administered 2022-12-20: 5000 [IU] via INTRAVENOUS

## 2022-12-20 MED ORDER — GADOBUTROL 1 MMOL/ML IV SOLN
10.0000 mL | Freq: Once | INTRAVENOUS | Status: AC | PRN
  Administered 2022-12-20: 10 mL via INTRAVENOUS

## 2022-12-20 MED ORDER — ACETAMINOPHEN 325 MG PO TABS: 650.0000 mg | ORAL_TABLET | ORAL | Status: DC | PRN

## 2022-12-20 MED ORDER — ROSUVASTATIN CALCIUM 20 MG PO TABS
20.0000 mg | ORAL_TABLET | Freq: Every day | ORAL | Status: DC
  Administered 2022-12-20 – 2022-12-21 (×2): 20 mg via ORAL
  Filled 2022-12-20 (×2): qty 1

## 2022-12-20 MED ORDER — SODIUM CHLORIDE 0.9 % IV SOLN: 250.0000 mL | INTRAVENOUS | Status: DC | PRN

## 2022-12-20 MED ORDER — VERAPAMIL HCL 2.5 MG/ML IV SOLN
INTRAVENOUS | Status: AC
  Filled 2022-12-20: qty 2

## 2022-12-20 MED ORDER — SODIUM CHLORIDE 0.9% FLUSH: 3.0000 mL | INTRAVENOUS | Status: DC | PRN

## 2022-12-20 MED ORDER — ONDANSETRON HCL 4 MG/2ML IJ SOLN: 4.0000 mg | Freq: Four times a day (QID) | INTRAMUSCULAR | Status: DC | PRN

## 2022-12-20 MED ORDER — IOHEXOL 350 MG/ML SOLN
INTRAVENOUS | Status: DC | PRN
  Administered 2022-12-20: 45 mL

## 2022-12-20 MED ORDER — FENTANYL CITRATE (PF) 100 MCG/2ML IJ SOLN
INTRAMUSCULAR | Status: AC
  Filled 2022-12-20: qty 2

## 2022-12-20 MED ORDER — MIDAZOLAM HCL 2 MG/2ML IJ SOLN
INTRAMUSCULAR | Status: AC
  Filled 2022-12-20: qty 2

## 2022-12-20 MED ORDER — HEPARIN SODIUM (PORCINE) 1000 UNIT/ML IJ SOLN
INTRAMUSCULAR | Status: AC
  Filled 2022-12-20: qty 10

## 2022-12-20 MED ORDER — HEPARIN (PORCINE) IN NACL 1000-0.9 UT/500ML-% IV SOLN
INTRAVENOUS | Status: DC | PRN
  Administered 2022-12-20 (×2): 500 mL

## 2022-12-20 MED ORDER — SODIUM CHLORIDE 0.9% FLUSH
3.0000 mL | Freq: Two times a day (BID) | INTRAVENOUS | Status: DC
  Administered 2022-12-20: 3 mL via INTRAVENOUS

## 2022-12-20 MED ORDER — VERAPAMIL HCL 2.5 MG/ML IV SOLN
INTRAVENOUS | Status: DC | PRN
  Administered 2022-12-20: 10 mL via INTRA_ARTERIAL

## 2022-12-20 MED ORDER — LIDOCAINE HCL (PF) 1 % IJ SOLN
INTRAMUSCULAR | Status: DC | PRN
  Administered 2022-12-20 (×2): 2 mL via INTRADERMAL

## 2022-12-20 MED ORDER — HYDRALAZINE HCL 20 MG/ML IJ SOLN: 10.0000 mg | INTRAMUSCULAR | Status: AC | PRN

## 2022-12-20 MED ORDER — SPIRONOLACTONE 25 MG PO TABS
25.0000 mg | ORAL_TABLET | Freq: Every day | ORAL | Status: DC
Start: 2022-12-21 — End: 2022-12-21
  Administered 2022-12-21: 25 mg via ORAL
  Filled 2022-12-20 (×2): qty 1

## 2022-12-20 MED ORDER — ASPIRIN 81 MG PO CHEW
81.0000 mg | CHEWABLE_TABLET | Freq: Every day | ORAL | Status: DC
  Administered 2022-12-21: 81 mg via ORAL
  Filled 2022-12-20: qty 1

## 2022-12-20 SURGICAL SUPPLY — 10 items
CATH 5FR JL3.5 JR4 ANG PIG MP (CATHETERS) IMPLANT
CATH BALLN WEDGE 5F 110CM (CATHETERS) IMPLANT
DEVICE RAD COMP TR BAND LRG (VASCULAR PRODUCTS) IMPLANT
GLIDESHEATH SLEND SS 6F .021 (SHEATH) IMPLANT
GUIDEWIRE INQWIRE 1.5J.035X260 (WIRE) IMPLANT
INQWIRE 1.5J .035X260CM (WIRE) ×1
KIT SYRINGE INJ CVI SPIKEX1 (MISCELLANEOUS) IMPLANT
PACK CARDIAC CATHETERIZATION (CUSTOM PROCEDURE TRAY) ×1 IMPLANT
SET ATX-X65L (MISCELLANEOUS) IMPLANT
SHEATH GLIDE SLENDER 4/5FR (SHEATH) IMPLANT

## 2022-12-20 NOTE — TOC CM/SW Note (Addendum)
Noted DME order for Life Vest- call made to [employer] liaison- VM left- support documentation faxed to [employer] to start process for LifeVest approval. TOC to follow up 1550- received call return from [facility]- she will f/u on fax referral and watch for approval in order to schedule pt for bedside fitting.

## 2022-12-20 NOTE — Progress Notes (Addendum)
Advanced Heart Failure Rounding Note  PCP-Cardiologist: None   Subjective:    BP 130-140s/90s overnight. LHC/RHC today.  Feeling good this morning. Sitting up in chair. Reports mild orthopnea overnight, describes as baseline.  Objective:   Weight Range: 95.7 kg Body mass index is 31.14 kg/m.   Vital Signs:   Temp:  [97.6 F (36.4 C)-98.5 F (36.9 C)] 97.6 F (36.4 C) (11/06 0919) Pulse Rate:  [73-87] 87 (11/06 0919) Resp:  [15-21] 18 (11/06 0919) BP: (100-175)/(76-105) 133/95 (11/06 0919) SpO2:  [94 %-100 %] 100 % (11/06 0359) Last BM Date : 12/18/22  Weight change: Filed Weights   12/17/22 2222 12/19/22 0330  Weight: 95.3 kg 95.7 kg   Intake/Output:   Physical Exam    General: Well appearing. No distress on RA HEENT: neck supple. Thick. Cardiac: JVP not visible. S1 and S2 present. No murmurs or rub. Resp: Lung sounds clear and equal B/L Abdomen: Soft, non-tender, non-distended. + BS. Extremities: Warm and dry. No rash, cyanosis, or edema Neuro: Alert and oriented x3. Affect pleasant. Moves all extremities without difficulty.  Telemetry   SR 80, few dropped beats overnight. ST depression noted in [place] II. (personally reviewed)  EKG    N/A  Labs    CBC Recent Labs    12/17/22 2226 12/18/22 0644 12/19/22 0659  WBC 9.0 9.3 9.7  NEUTROABS 5.8 5.9  --   HGB 15.0 15.1 16.5  HCT 45.2 47.0 49.3  MCV 88.6 93.1 88.2  PLT 309 250 331   Basic Metabolic Panel Recent Labs    40/98/11 0644 12/19/22 0659 12/20/22 0321  NA 135 135 136  K 4.0 4.1 4.3  CL 104 104 103  CO2 23 24 25   GLUCOSE 115* 104* 103*  BUN 12 16 23   CREATININE 1.52* 1.50* 1.54*  CALCIUM 9.5 9.9 9.6  MG 2.0 2.3  --    Liver Function Tests Recent Labs    12/18/22 0644 12/19/22 0659  AST 51* 44*  ALT 51* 48*  ALKPHOS 36* 39  BILITOT 0.9 0.8  PROT 6.3* 6.3*  ALBUMIN 3.3* 3.3*   Recent Labs    12/19/22 0659  LIPASE 50   Cardiac Enzymes No results for input(s):  "CKTOTAL", "CKMB", "CKMBINDEX", "TROPONINI" in the last 72 hours.  BNP: BNP (last 3 results) Recent Labs    12/18/22 0644  BNP 75.7    ProBNP (last 3 results) No results for input(s): "PROBNP" in the last 8760 hours.   D-Dimer No results for input(s): "DDIMER" in the last 72 hours. Hemoglobin A1C No results for input(s): "HGBA1C" in the last 72 hours. Fasting Lipid Panel Recent Labs    12/20/22 0321  CHOL 202*  HDL 28*  LDLCALC 102*  TRIG 361*  CHOLHDL 7.2   Thyroid Function Tests Recent Labs    12/18/22 0644  TSH 1.870   Imaging   No results found.  Medications:    Scheduled Medications:  carvedilol  3.125 mg Oral BID WC   dapagliflozin propanediol  10 mg Oral Daily   enoxaparin (LOVENOX) injection  40 mg Subcutaneous Daily   folic acid  1 mg Oral Daily   multivitamin with minerals  1 tablet Oral Daily   pantoprazole  40 mg Oral Daily   sacubitril-valsartan  1 tablet Oral BID   sodium chloride flush  10 mL Intravenous Q12H   sodium chloride flush  3 mL Intravenous Q12H   spironolactone  12.5 mg Oral Daily   thiamine  100  mg Oral Daily   Or   thiamine  100 mg Intravenous Daily     PRN Medications: acetaminophen **OR** acetaminophen, albuterol, hydrALAZINE, LORazepam **OR** LORazepam, melatonin, ondansetron (ZOFRAN) IV, zolpidem  Patient Profile   Travis Shea is a 67 yo 67 yo male with past medical history of HTN, GERD, and osteoarthritis. Admitted for syncope and acute systolic heart failure.   Assessment/Plan   1. Acute Systolic Heart Failure: Echo this admission with EF 20-25%, moderate LV dilation, mild-moderate AS with mean gradient 14 mmHg and AVA 1.25 cm^2, mild AI, IVC normal. He was admitted due to syncopal episode and does not appear significantly volume overloaded on exam. Patient has history of untreated HTN, as well as cocaine use. No strong FH of cardiomyopathy. It is possible that the cardiomyopathy is due to cocaine, but he also had  coronary calcification on CTA chest. NYHA class II symptoms at home. BP mildly elevated at times, and he is not orthostatic.  - Appears euvolemic on exam. - Increase Entresto 49-51 mg BID - Start Farxiga 10 mg  - Start Spiro 12.5 mg  - Start Coreg 3.125 mg  - Consider addition of Bidil 0.5 tab, after procedure if HTN persists - RHC/LHC today   2. Syncope: occurred on couch while watching TV, no prodrome.  He has not been orthostatic here and BP running high.  He had episode of NSVT noted.  I worry that this was an arrhythmic event.  - Monitor on telemetry for further VT/NSVT.  - Lifevest at discharge if no significant arrhythmias found  3. Aortic Stenosis - mild to moderate noted on ECHO 11/4   4. AKI vs CKD IIIa: baseline appears to be ~1.3. sCr on admission 1.5 - stable at 1.54 today - Follow BMET closely   5. NSVT - 9 beat run noted on tele yesterday; none noted - EKG this morning, ST depression on tele - Lifevest as discharge if no significant arrythmic event  5. HTN - mild improvement SBP 130s/90s this morning - GDMT to control - Continue Coreg 3.125 mg  - Could consider addition of Bidil 0.5 tab this afternoon if dose not further improve.   6. Hyperlipidemia - 11/6: TGL 361, LDL 102, VLDL 72, HDL28 - Start Statin therapy - LHC today    7. Thoracic/Abdominal Aortic Aneurysm  - 4.3 cm ascending aortic aneurysm noted on CT chest - 3.3 cm abdominal aortic aneurism noted on CT - Continue outpatient surveillance, per guidelines - Tight BP control   8. ETOH use - cessation education   9. Cocaine use - history of use for years, last used 10 days ago - Endorses he is attempting to quit; strongly encouraged - USD pending    10. GERD/Abdominal bloating - Continue Protonix - AST/ALT mildly elevated, improving - RUQ [place] normal  Length of Stay: 0  [place] Lee, NP  12/20/2022, 9:26 AM  Advanced Heart Failure Team Pager [phone] (M-F; 7a - 5p)  Please contact [facility] for night-coverage after hours (5p -7a ) and weekends on [email or web address]  Patient seen with NP, agree with the above note.   Some orthopnea, otherwise feels fine.  Has had no chest pain.   LHC/RHC today: Coronary Findings  Diagnostic Dominance: Right Left Main  Mid LM lesion is 30% stenosed.    Left Anterior Descending  Mid LAD lesion is 40% stenosed.  Mid LAD to Dist LAD lesion is 75% stenosed.    Second Diagonal Branch  2nd Diag lesion is 40%  stenosed.    Right Coronary Artery  Mid RCA to Dist RCA lesion is 30% stenosed.    Intervention   No interventions have been documented.   Right Heart  Right Heart Pressures RHC Procedural Findings: Hemodynamics (mmHg) RA mean 5 RV 23/7 PA 21/8, mean 13 PCWP mean 5 LV 127/11 AO 117/75  Peak-to-peak aortic valve gradient 10 mmHg  Oxygen saturations: PA 62% AO 93%  Cardiac Output (Fick) 4.05  Cardiac Index (Fick) 1.91  PAPi 2.6   General: NAD Neck: No JVD, no thyromegaly or thyroid nodule.  Lungs: Clear to auscultation bilaterally with normal respiratory effort. CV: Nondisplaced PMI.  Heart regular S1/S2, no S3/S4, no murmur.  No peripheral edema.   Abdomen: Soft, nontender, no hepatosplenomegaly, no distention.  Skin: Intact without lesions or rashes.  Neurologic: Alert and oriented x 3.  Psych: Normal affect. Extremities: No clubbing or cyanosis.  HEENT: Normal.   1. Acute systolic CHF: Echo this admission with  EF 20-25%, moderate LV dilation, mild-moderate AS with mean gradient 14 mmHg and AVA 1.25 cm^2, mild AI, IVC normal.  He was admitted due to syncopal episode and does not appear significantly volume overloaded on exam.  Patient has history of untreated HTN as well as cocaine use. No strong FH of cardiomyopathy. It is possible that the cardiomyopathy is due to cocaine.  LHC/RHC today showed normal filling pressures, low CI 1.9.  He had a 75% mid-distal LAD stenosis, but I do not think that this explains  the extent of his cardiomyopathy.   NYHA class II symptoms at home.  BP remains elevated.  - Increase Entresto to 97/103 bid.  - Continue Farxiga 10 mg daily.  - Continue Coreg 3.125 mg bid.  - Increase spironolactone to 25 mg daily tomorrow.   - Imperative to quit cocaine.  - Cardiac MRI to assess for infiltrative disease/myocarditis.  2.  Syncope: Occurred on couch while watching TV, no prodrome.  He has not been orthostatic here and BP running high.  He had episode of NSVT noted.  I worry that this was an arrhythmic event. No events on telemetry overnight.  - Monitor on telemetry for further VT/NSVT.  - Lifevest at discharge if no significant arrhythmias found.  3. AKI versus CKD stage 3: Creatinine 1.5, this may be his baseline. Follow with med titration.  He does not need aggressive diuresis.  4. CAD: Mild troponin elevation at admission with no trend.  No chest pain.  Suspect demand ischemia in setting of syncope.  Cath showed 75% mid to distal LAD stenosis, otherwise nonobstructive disease.  I do not think he had ACS.  I do not think that his degree of CAD explains markedly low EF (?due to cocaine abuse).  He has no anginal-type chest pain.  Manage medically for now.  - ASA 81 daily - start Crestor 20 mg daily.  5. Aortic stenosis: Mild to moderate on echo.  Will need to follow going forward.  Do not think that this contributed to symptoms.  Peak to peak gradient on cath suggests mild AS.  6. 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 Follow with time and control BP.  7. HTN: Adding GDMT to control.  Not on meds at home.  8. Cocaine use: Daily x yrs, says he quit 1 week ago.  - Strongly encouraged him to quit.  9. Abdominal bloating: RUQ US showed no cirrhosis or ascites.   Marca Ancona 12/20/2022 2:11 PM

## 2022-12-20 NOTE — Progress Notes (Signed)
PROGRESS NOTE  Travis Shea ZOX:[id number] DOB: 1955/12/25   PCP: Myrlene Broker, MD  Patient is from: Home.  Independently ambulates at baseline.  DOA: 12/17/2022 LOS: 0  Chief complaints Chief Complaint  Patient presents with   Loss of Consciousness     Brief Narrative / Interim history: 67 year old M with PMH of CKD-3, HTN, GERD, cocaine use and alcohol use presenting with syncope while watching TV.  Had lightheadedness going on for about a month.  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  Advanced heart failure consulted and initiated GDMT.  Advanced HF team titrating GDMT.  Plan for [facility] on 11/6.   Subjective: Seen and examined earlier this morning.  No major events overnight of this morning.  No complaints.  Reports a lot of urine output although this has not been charted.  Advised to use urinal so RN staff with measure.  Objective: Vitals:   12/20/22 1335 12/20/22 1340 12/20/22 1345 12/20/22 1350  BP: 123/75 135/76 133/87 123/84  Pulse: 74 73 72 71  Resp: 13 13 16 20   Temp:      TempSrc:      SpO2: 95% (!) 86% 91% 95%  Weight:      Height:        Examination:  GENERAL: No apparent distress.  Nontoxic. HEENT: MMM.  Vision and hearing grossly intact.  NECK: Supple.  No apparent JVD.  RESP:  No IWOB.  Fair aeration bilaterally. CVS:  RRR. Heart sounds normal.  ABD/GI/GU: BS+. Abd soft, NTND.  MSK/EXT:  Moves extremities. No apparent deformity. No edema.  SKIN: no apparent skin lesion or wound NEURO: Awake, alert and oriented appropriately.  No apparent focal neuro deficit. PSYCH: Calm. Normal affect.   Procedures:  None  Microbiology summarized: None  Assessment and plan: Syncope and collapse: Likely due to heart failure.  History of cocaine use but denies recent use in 10 days.  Also reports alcohol use.  TTE with LVEF of 20 to 25%, G1-DD, mild AS and mildly reduced RVSF.  CT angio  negative for PE.  TSH normal. -Manage CHF as below -Continue telemetry monitoring -Encourage alcohol and cocaine cessation  Acute combined HF: TTE as above.  BNP normal.  Currently asymptomatic except for some shortness of breath.  Denies orthopnea or PND. Appears euvolemic.  Not on diuretics.  I&O incomplete. -HF team titrating GDMT: On Entresto, Coreg, Aldactone and Farxiga -Plan for [facility] on 11/6. -Strict intake and output, daily weight, renal functions and electrolytes -Encouraged alcohol and cocaine cessation  Mild to mod aortic stenosis: Noted on TTE. -Per cardiology  NSVT: Noted on telemetry the day of admission -Continue telemetry monitoring -Optimize electrolytes -Advanced on Coreg by cardiology   Elevated troponin/coronary artery calcification: Noted on CTA chest.  Troponin elevation likely demand ischemia versus ACS.  No significant delta.  A1c 6.0% on 10/15.  LDL 95 in 05/2022 -Manage CHF as above -Plan for [facility]   Essential hypertension: BP elevated but improved. -Cardiac meds per cardiology  Polysubstance use: Admitted using cocaine, marijuana and alcohol -Counseled on the importance of cessation -Continue CIWA with as needed Ativan -Multivitamin, thiamine and folic acid -TOC consulted   CKD-3A: AKI ruled out.  Baseline Cr ~1.3. Recent Labs    05/25/22 1045 11/28/22 1640 12/17/22 2226 12/18/22 0644 12/19/22 0659 12/20/22 0321  BUN 13 11 12 12 16 23   CREATININE 1.20 1.38 1.52* 1.52* 1.50* 1.54*  -Monitor  AAA: 4.3 cm ascending TAA and 3.3 cm mid AAA noted on CT. -Recommend annual screening of the ascending thoracic in outpatient setting -Recommend 3-year screening noted for the mid abdominal aneurysm   Elevated liver function test: Likely congestive hepatopathy.  Improving. -Continue monitoring  Dyslipidemia: Labs from 4/11 noted total cholesterol 148, HDL  25.7, LDL 95, triglycerides 236.  -Start statin on discharge   GERD -PPI    Hypokalemia -Monitor replenish as appropriate    Obesity Body mass index is 31.14 kg/m.          DVT prophylaxis:  enoxaparin (LOVENOX) injection 40 mg Start: 12/18/22 1000 SCDs Start: 12/18/22 0507  Code Status: Full code Family Communication: None at bedside Level of care: Telemetry Cardiac Status is: Observation The patient will require care spanning > 2 midnights and should be moved to inpatient because: Acute heart failure, syncope   Final disposition: Home Consultants:  Advanced heart failure team  55 minutes with more than 50% spent in reviewing records, counseling patient/family and coordinating care.   Sch Meds:  Scheduled Meds:  [START ON 12/21/2022] aspirin  81 mg Oral Daily   [MAR Hold] carvedilol  3.125 mg Oral BID WC   [MAR Hold] dapagliflozin propanediol  10 mg Oral Daily   [MAR Hold] enoxaparin (LOVENOX) injection  40 mg Subcutaneous Daily   [MAR Hold] folic acid  1 mg Oral Daily   [MAR Hold] multivitamin with minerals  1 tablet Oral Daily   [MAR Hold] pantoprazole  40 mg Oral Daily   rosuvastatin  20 mg Oral Daily   sacubitril-valsartan  1 tablet Oral BID   sodium chloride flush  10 mL Intravenous Q12H   [MAR Hold] sodium chloride flush  3 mL Intravenous Q12H   [START ON 12/21/2022] spironolactone  25 mg Oral Daily   [MAR Hold] thiamine  100 mg Oral Daily   Or   [MAR Hold] thiamine  100 mg Intravenous Daily   Continuous Infusions: PRN Meds:.[MAR Hold] acetaminophen **OR** [MAR Hold] acetaminophen, [MAR Hold] albuterol, [MAR Hold] hydrALAZINE, [MAR Hold] LORazepam **OR** [MAR Hold] LORazepam, [MAR Hold] melatonin, [MAR Hold] ondansetron (ZOFRAN) IV, [MAR Hold] zolpidem  Antimicrobials: Anti-infectives (From admission, onward)    None        I have personally reviewed the following labs and images: CBC: Recent Labs  Lab 12/17/22 2226 12/18/22 0644 12/19/22 0659  WBC 9.0 9.3 9.7  NEUTROABS 5.8 5.9  --   HGB 15.0 15.1 16.5  HCT 45.2  47.0 49.3  MCV 88.6 93.1 88.2  PLT 309 250 331   BMP &GFR Recent Labs  Lab 12/17/22 2226 12/18/22 0644 12/19/22 0659 12/20/22 0321  NA 135 135 135 136  K 3.4* 4.0 4.1 4.3  CL 102 104 104 103  CO2 22 23 24 25   GLUCOSE 133* 115* 104* 103*  BUN 12 12 16 23   CREATININE 1.52* 1.52* 1.50* 1.54*  CALCIUM 9.7 9.5 9.9 9.6  MG  --  2.0 2.3  --    Estimated Creatinine Clearance: 52.4 mL/min (A) (by C-G formula based on SCr of 1.54 mg/dL (H)). Liver & Pancreas: Recent Labs  Lab 12/18/22 0644 12/19/22 0659  AST 51* 44*  ALT 51* 48*  ALKPHOS 36* 39  BILITOT 0.9 0.8  PROT 6.3* 6.3*  ALBUMIN 3.3* 3.3*   Recent Labs  Lab 12/19/22 0659  LIPASE 50   No results for input(s): "AMMONIA" in the last 168 hours. Diabetic: No results for input(s): "HGBA1C" in the last 72 hours.  Recent Labs  Lab 12/17/22 2234  GLUCAP 145*   Cardiac Enzymes: No results for input(s): "CKTOTAL", "CKMB", "CKMBINDEX", "TROPONINI" in the last 168 hours. No results for input(s): "PROBNP" in the last 8760 hours. Coagulation Profile: No results for input(s): "INR", "PROTIME" in the last 168 hours. Thyroid Function Tests: Recent Labs    12/18/22 0644  TSH 1.870   Lipid Profile: Recent Labs    12/20/22 0321  CHOL 202*  HDL 28*  LDLCALC 102*  TRIG 361*  CHOLHDL 7.2   Anemia Panel: No results for input(s): "VITAMINB12", "FOLATE", "FERRITIN", "TIBC", "IRON", "RETICCTPCT" in the last 72 hours. Urine analysis:    Component Value Date/Time   COLORURINE YELLOW 12/18/2022 0027   APPEARANCEUR HAZY (A) 12/18/2022 0027   LABSPEC 1.024 12/18/2022 0027   PHURINE 5.0 12/18/2022 0027   GLUCOSEU NEGATIVE 12/18/2022 0027   HGBUR NEGATIVE 12/18/2022 0027   BILIRUBINUR NEGATIVE 12/18/2022 0027   KETONESUR NEGATIVE 12/18/2022 0027   PROTEINUR NEGATIVE 12/18/2022 0027   NITRITE NEGATIVE 12/18/2022 0027   LEUKOCYTESUR NEGATIVE 12/18/2022 0027   Sepsis Labs: Invalid input(s): "PROCALCITONIN",  "LACTICIDVEN"  Microbiology: No results found for this or any previous visit (from the past 240 hour(s)).  Radiology Studies: No results found.    Musab Wingard T. Ludene Stokke [facility]  If 7PM-7AM, please contact night-coverage [email or web address] 12/20/2022, 2:01 PM

## 2022-12-20 NOTE — Interval H&P Note (Signed)
History and Physical Interval Note:  12/20/2022 1:18 PM  Travis Shea  has presented today for surgery, with the diagnosis of heart failure.  The various methods of treatment have been discussed with the patient and family. After consideration of risks, benefits and other options for treatment, the patient has consented to  Procedure(s): RIGHT/LEFT HEART CATH AND CORONARY ANGIOGRAPHY (N/A) as a surgical intervention.  The patient's history has been reviewed, patient examined, no change in status, stable for surgery.  I have reviewed the patient's chart and labs.  Questions were answered to the patient's satisfaction.     Terryn Rosenkranz [employer]

## 2022-12-20 NOTE — TOC Progression Note (Signed)
Transition of Care [facility]) - Progression Note    Patient Details  Name: Travis Shea MRN: [id number] Date of Birth: 10/05/1955  Transition of Care [facility]) CM/SW Contact  Nicanor Bake Phone Number: [phone] 12/20/2022, 12:07 PM  Clinical Narrative: HF CSW called to schedule pts  hospital follow up appointment for Monday, January 01, 2023 at 1:00 PM.   TOC will continue following.         Expected Discharge Plan and Services                                               Social Determinants of Health (SDOH) Interventions SDOH Screenings   Food Insecurity: Patient Declined (12/19/2022)  Housing: High Risk (12/19/2022)  Transportation Needs: No Transportation Needs (11/28/2022)  Utilities: Patient Declined (12/19/2022)  Alcohol Screen: Low Risk  (12/19/2022)  Depression (PHQ2-9): Low Risk  (11/28/2022)  Financial Resource Strain: Patient Declined (11/28/2022)  Physical Activity: Sufficiently Active (11/28/2022)  Social Connections: Moderately Isolated (11/28/2022)  Stress: No Stress Concern Present (11/28/2022)  Tobacco Use: Low Risk  (12/18/2022)  Health Literacy: Adequate Health Literacy (12/19/2022)    Readmission Risk Interventions     No data to display

## 2022-12-20 NOTE — Telephone Encounter (Signed)
Advanced Heart Failure Patient Advocate Encounter  The patient was approved for a [employer] grant that will help cover the cost of Sherryll Burger, Farxiga. Total amount awarded, $10,000. Eligibility, 11/20/22 - 11/19/23.  ID [id number]  BIN [id number]  PCN [id number]  Group [id number]  Added billing information to WAM.  Archer Asa, CPhT

## 2022-12-21 ENCOUNTER — Encounter (HOSPITAL_COMMUNITY): Payer: Self-pay | Admitting: Cardiology

## 2022-12-21 ENCOUNTER — Other Ambulatory Visit (HOSPITAL_COMMUNITY): Payer: Self-pay

## 2022-12-21 DIAGNOSIS — E785 Hyperlipidemia, unspecified: Secondary | ICD-10-CM | POA: Diagnosis not present

## 2022-12-21 DIAGNOSIS — N289 Disorder of kidney and ureter, unspecified: Secondary | ICD-10-CM | POA: Diagnosis not present

## 2022-12-21 DIAGNOSIS — F1990 Other psychoactive substance use, unspecified, uncomplicated: Secondary | ICD-10-CM | POA: Diagnosis not present

## 2022-12-21 DIAGNOSIS — R55 Syncope and collapse: Secondary | ICD-10-CM | POA: Diagnosis not present

## 2022-12-21 DIAGNOSIS — I517 Cardiomegaly: Secondary | ICD-10-CM | POA: Diagnosis not present

## 2022-12-21 DIAGNOSIS — E876 Hypokalemia: Secondary | ICD-10-CM | POA: Diagnosis not present

## 2022-12-21 DIAGNOSIS — I5043 Acute on chronic combined systolic (congestive) and diastolic (congestive) heart failure: Secondary | ICD-10-CM | POA: Diagnosis not present

## 2022-12-21 DIAGNOSIS — I10 Essential (primary) hypertension: Secondary | ICD-10-CM | POA: Diagnosis not present

## 2022-12-21 DIAGNOSIS — I5021 Acute systolic (congestive) heart failure: Secondary | ICD-10-CM | POA: Diagnosis not present

## 2022-12-21 DIAGNOSIS — R7989 Other specified abnormal findings of blood chemistry: Secondary | ICD-10-CM | POA: Diagnosis not present

## 2022-12-21 DIAGNOSIS — I2510 Atherosclerotic heart disease of native coronary artery without angina pectoris: Secondary | ICD-10-CM | POA: Diagnosis not present

## 2022-12-21 DIAGNOSIS — I714 Abdominal aortic aneurysm, without rupture, unspecified: Secondary | ICD-10-CM | POA: Diagnosis not present

## 2022-12-21 LAB — BASIC METABOLIC PANEL
Anion gap: 8 (ref 5–15)
BUN: 23 mg/dL (ref 8–23)
CO2: 22 mmol/L (ref 22–32)
Calcium: 9.2 mg/dL (ref 8.9–10.3)
Chloride: 104 mmol/L (ref 98–111)
Creatinine, Ser: 1.44 mg/dL — ABNORMAL HIGH (ref 0.61–1.24)
GFR, Estimated: 53 mL/min — ABNORMAL LOW (ref >60)
Glucose, Bld: 125 mg/dL — ABNORMAL HIGH (ref 70–99)
Potassium: 4 mmol/L (ref 3.5–5.1)
Sodium: 134 mmol/L — ABNORMAL LOW (ref 135–145)

## 2022-12-21 LAB — LIPOPROTEIN A (LPA): Lipoprotein (a): 13.3 nmol/L (ref <75.0)

## 2022-12-21 MED ORDER — THIAMINE HCL 100 MG PO TABS
100.0000 mg | ORAL_TABLET | Freq: Every day | ORAL | 0 refills | Status: DC
  Filled 2022-12-21: qty 30, 30d supply, fill #0

## 2022-12-21 MED ORDER — DAPAGLIFLOZIN PROPANEDIOL 10 MG PO TABS
10.0000 mg | ORAL_TABLET | Freq: Every day | ORAL | 1 refills | Status: DC
  Filled 2022-12-21: qty 90, 90d supply, fill #0
  Filled 2023-03-16: qty 90, 90d supply, fill #1

## 2022-12-21 MED ORDER — SPIRONOLACTONE 25 MG PO TABS
25.0000 mg | ORAL_TABLET | Freq: Every day | ORAL | 0 refills | Status: DC
  Filled 2022-12-21: qty 90, 90d supply, fill #0

## 2022-12-21 MED ORDER — ASPIRIN 81 MG PO CHEW: 81.0000 mg | CHEWABLE_TABLET | Freq: Every day | ORAL | Status: DC

## 2022-12-21 MED ORDER — SACUBITRIL-VALSARTAN 97-103 MG PO TABS
1.0000 | ORAL_TABLET | Freq: Two times a day (BID) | ORAL | 0 refills | Status: DC
  Filled 2022-12-21: qty 180, 90d supply, fill #0

## 2022-12-21 MED ORDER — ROSUVASTATIN CALCIUM 20 MG PO TABS
20.0000 mg | ORAL_TABLET | Freq: Every day | ORAL | 0 refills | Status: DC
  Filled 2022-12-21: qty 90, 90d supply, fill #0

## 2022-12-21 MED ORDER — CARVEDILOL 3.125 MG PO TABS
3.1250 mg | ORAL_TABLET | Freq: Two times a day (BID) | ORAL | 0 refills | Status: DC
  Filled 2022-12-21: qty 180, 90d supply, fill #0

## 2022-12-21 NOTE — Progress Notes (Signed)
Mobility Specialist Progress Note:   12/21/22 1047  Mobility  Activity Ambulated independently in hallway  Level of Assistance Independent  Assistive Device None  Distance Ambulated (ft) 425 ft  Activity Response Tolerated well  Mobility Referral Yes  $Mobility charge 1 Mobility  Mobility Specialist Start Time (ACUTE ONLY) 0945  Mobility Specialist Stop Time (ACUTE ONLY) 0955  Mobility Specialist Time Calculation (min) (ACUTE ONLY) 10 min   Pre Mobility: 79 HR  During Mobility: 83 HR , 99% SpO2 Post Mobility: 80 HR   Pt received in chair, eager to mobility. C/o slight SOB during ambulation, otherwise asymptomatic throughout. Pt returned to chair with call bell in reach and all needs met.  Travis Shea  Mobility Specialist Please contact via [employer] office at [phone]

## 2022-12-21 NOTE — Progress Notes (Addendum)
Advanced Heart Failure Rounding Note  PCP-Cardiologist: None   Subjective:    Competed cMRI this morning. Interpretation pending. He reports he felt a bit SOB near the tail end of the study. No current dyspnea sitting up right.   Getting fitted for LifeVest currently. Asking if he can go home.    Objective:   Weight Range: 94 kg Body mass index is 30.61 kg/m.   Vital Signs:   Temp:  [97.5 F (36.4 C)-98.6 F (37 C)] 97.5 F (36.4 C) (11/07 1134) Pulse Rate:  [65-80] 73 (11/07 1134) Resp:  [11-20] 12 (11/07 1134) BP: (105-161)/(65-135) 132/95 (11/07 1134) SpO2:  [86 %-99 %] 99 % (11/07 1134) Weight:  [94 kg] 94 kg (11/07 0458) Last BM Date : 12/20/22  Weight change: Filed Weights   12/17/22 2222 12/19/22 0330 12/21/22 0458  Weight: 95.3 kg 95.7 kg 94 kg   Intake/Output:   Physical Exam    General:  Well appearing, muscular/fit. No respiratory difficulty HEENT: normal Neck: supple. no JVD. Carotids 2+ bilat; no bruits. No lymphadenopathy or thyromegaly appreciated. Cor: PMI nondisplaced. Distant BS (barrel chest) Regular rate & rhythm. No rubs, gallops or murmurs. Lungs: clear Abdomen: soft, nontender, nondistended. No hepatosplenomegaly. No bruits or masses. Good bowel sounds. Extremities: no cyanosis, clubbing, rash, edema Neuro: alert & oriented x 3, cranial nerves grossly intact. moves all 4 extremities w/o difficulty. Affect pleasant.   Telemetry   N/A, currently disconnected from tele  EKG    N/A  Labs    CBC Recent Labs    12/19/22 0659 12/20/22 1328 12/20/22 1427  WBC 9.7  --  11.7*  HGB 16.5 18.4*  18.7* 17.1*  HCT 49.3 54.0*  55.0* 51.2  MCV 88.2  --  86.3  PLT 331  --  363   Basic Metabolic Panel Recent Labs    57/84/69 0659 12/20/22 0321 12/20/22 1328 12/20/22 1427 12/21/22 0341  NA 135 136 135  137  --  134*  K 4.1 4.3 4.0  4.3  --  4.0  CL 104 103  --   --  104  CO2 24 25  --   --  22  GLUCOSE 104* 103*  --   --   125*  BUN 16 23  --   --  23  CREATININE 1.50* 1.54*  --  1.29* 1.44*  CALCIUM 9.9 9.6  --   --  9.2  MG 2.3  --   --   --   --    Liver Function Tests Recent Labs    12/19/22 0659  AST 44*  ALT 48*  ALKPHOS 39  BILITOT 0.8  PROT 6.3*  ALBUMIN 3.3*   Recent Labs    12/19/22 0659  LIPASE 50   Cardiac Enzymes No results for input(s): "CKTOTAL", "CKMB", "CKMBINDEX", "TROPONINI" in the last 72 hours.  BNP: BNP (last 3 results) Recent Labs    12/18/22 0644  BNP 75.7    ProBNP (last 3 results) No results for input(s): "PROBNP" in the last 8760 hours.   D-Dimer No results for input(s): "DDIMER" in the last 72 hours. Hemoglobin A1C No results for input(s): "HGBA1C" in the last 72 hours. Fasting Lipid Panel Recent Labs    12/20/22 0321  CHOL 202*  HDL 28*  LDLCALC 102*  TRIG 361*  CHOLHDL 7.2   Thyroid Function Tests No results for input(s): "TSH", "T4TOTAL", "T3FREE", "THYROIDAB" in the last 72 hours.  Invalid input(s): "FREET3"  Imaging  CARDIAC CATHETERIZATION  Result Date: 12/20/2022 1. Normal filling pressures. 2. Low cardiac index 1.9. 3. 75% mid to distal LAD stenosis.  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  He has no chest pain.  Would manage medically for now, can consider PCI if he develops anginal-type pain.    Medications:    Scheduled Medications:  aspirin  81 mg Oral Daily   carvedilol  3.125 mg Oral BID WC   dapagliflozin propanediol  10 mg Oral Daily   enoxaparin (LOVENOX) injection  40 mg Subcutaneous Q24H   folic acid  1 mg Oral Daily   multivitamin with minerals  1 tablet Oral Daily   pantoprazole  40 mg Oral Daily   rosuvastatin  20 mg Oral Daily   sacubitril-valsartan  1 tablet Oral BID   sodium chloride flush  3 mL Intravenous Q12H   sodium chloride flush  3 mL Intravenous Q12H   spironolactone  25 mg Oral Daily   thiamine  100 mg Oral Daily   Or   thiamine  100  mg Intravenous Daily    sodium chloride      PRN Medications: sodium chloride, acetaminophen, albuterol, hydrALAZINE, melatonin, ondansetron (ZOFRAN) IV, sodium chloride flush, zolpidem  Patient Profile   Travis Shea is a 67 yo 67 yo male with past medical history of HTN, GERD, and osteoarthritis. Admitted for syncope and acute systolic heart failure.   Assessment/Plan   1. Acute systolic CHF: Echo this admission with  EF 20-25%, moderate LV dilation, mild-moderate AS with mean gradient 14 mmHg and AVA 1.25 cm^2, mild AI, IVC normal.  He was admitted due to syncopal episode and does not appear significantly volume overloaded on exam.  Patient has history of untreated HTN as well as cocaine use. No strong FH of cardiomyopathy. It is possible that the cardiomyopathy is due to cocaine.  LHC/RHC this admit showed normal filling pressures, low CI 1.9.  He had a 75% mid-distal LAD stenosis, but I do not think that this explains the extent of his cardiomyopathy.   NYHA class II symptoms at home.   - Continue Entresto 97/103 bid.  - Continue Farxiga 10 mg daily.  - Continue Coreg 3.125 mg bid.  - Continue spironolactone 25 mg daily  - Imperative to quit cocaine.  - Plan Cardiac MRI today to assess for infiltrative disease/myocarditis. Results pending  2.  Syncope: Occurred on couch while watching TV, no prodrome.  He has not been orthostatic here and BP running high.  He had episode of NSVT noted.  I worry that this was an arrhythmic event. No events on telemetry overnight.  - Monitor on telemetry for further VT/NSVT.  - Lifevest at discharge 3. AKI versus CKD stage 3: Creatinine 1.3-1.5 this admission, this may be his baseline. Follow with med titration.  He does not need aggressive diuresis.  4. CAD: Mild troponin elevation at admission with no trend.  No chest pain.  Suspect demand ischemia in setting of syncope.  Cath showed 75% mid to distal LAD stenosis, otherwise nonobstructive disease.   I do not think he had ACS.  I do not think that his degree of CAD explains markedly low EF (?due to cocaine abuse).  He has no anginal-type chest pain.  Manage medically for now.  - ASA 81 daily - started on Crestor 20 mg daily.  5. Aortic stenosis: Mild to moderate on echo.  Will need to follow going forward.  Do not think that this contributed to symptoms.  Peak to peak gradient on cath suggests mild AS.  6. 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 Follow with time and control BP.  7. HTN: Not on meds at home. Improving w/ GDMT.  8. Cocaine use: Daily x yrs, says he quit 1 week ago.  - Strongly encouraged him to quit.  9. Abdominal bloating: RUQ US showed no cirrhosis or ascites   Length of Stay: 0  Brittainy Simmons, PA-C  12/21/2022, 11:54 AM  Advanced Heart Failure Team Pager [phone] (M-F; 7a - 5p)  Please contact [facility] for night-coverage after hours (5p -7a ) and weekends on [email or web address]  Patient seen with PA, agree with the above note.   Cardiac MRI done today, 1. Mildly dilated LV with mild concentric LV hypertrophy. EF 23%, diffuse hypokinesis appeared worse in septum. 2. Normal RV size with mildly decreased systolic function, RV EF 42%. 3. Aortic valve not well-visualized, may be bicuspid. Mild aortic insufficiency with regurgitant fraction 18%. Mild restriction. 4. Mitral regurgitant fraction suggested moderate mitral regurgitation but this was not seen visually. 5. Primarily a mid-wall LGE pattern suggesting a non-ischemic etiology, possible prior myocarditis. However, there is transmural scar in the apical lateral wall, cannot rule out prior MI. 6.  Extracellular volume percentage not significantly elevated.  No complaints, BP under better control.   General: NAD Neck: No JVD, no thyromegaly or thyroid nodule.  Lungs: Clear to auscultation bilaterally with normal respiratory effort. CV: Nondisplaced PMI.  Heart regular S1/S2, no S3/S4, no murmur.  No peripheral  edema.  Abdomen: Soft, nontender, no hepatosplenomegaly, no distention.  Skin: Intact without lesions or rashes.  Neurologic: Alert and oriented x 3.  Psych: Normal affect. Extremities: No clubbing or cyanosis.  HEENT: Normal.   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  Global T2 not elevated on MRI so doubt active myocarditis.   Creatinine stable, not volume overloaded on exam.   Continue current cardiac medication regimen for home.  He has been fit with a Lifevest.  He can go home today from my standpoint, needs followup in CHF clinic.  Meds for discharge: ASA 81, Crestor 20, Coreg 3.125 mg bid, Entresto 97/103 bid, spironolactone 25 daily, Farxiga 10 daily.   Marca Ancona 12/21/2022 1:22 PM

## 2022-12-22 ENCOUNTER — Other Ambulatory Visit (HOSPITAL_COMMUNITY): Payer: Self-pay

## 2022-12-22 NOTE — Discharge Summary (Signed)
Physician Discharge Summary  Travis Shea BJY:[id number] DOB: April 12, 1955 DOA: 12/17/2022  PCP: Myrlene Broker, MD  Admit date: 12/17/2022 Discharge date: 12/21/2022 Admitted From: Home Disposition: Home Recommendations for Outpatient Follow-up:  Outpatient follow-up with PCP and cardiology as below Assess blood pressure, fluid status, renal functions and electrolytes at follow-up Please follow up on the following pending results: None  Home Health: Not indicated Equipment/Devices: Not indicated  Discharge Condition: Stable CODE STATUS: Full code  Follow-up Information     Myrlene Broker, MD. Go in 10 day(s).   Specialty: Internal Medicine Why: Hospital follow up appointment scheduled for Monday, January 01, 2023 at 1:00 PM.  PLEASE ARRIVE 10-15 minutes early.  PLEASE call and cancel/reschedule if you CANNOT make appointment. Contact information: [address] [place] [address] [phone]         [facility] Follow up on 12/27/2022.   Specialty: Cardiology Why: Advanced Heart Failure Clinic  Appointment with Dr. Shirlee Latch at 3:20pm Contact information: [address] [place] [address] [phone]                Hospital course 67 year old M with PMH of CKD-3, HTN, GERD, cocaine use and alcohol use presenting with syncope while watching TV.  Had lightheadedness going on for about a month.  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  Advanced heart failure consulted and initiated GDMT.   Advanced HF team titrating GDMT.   R/LHC on 11/6 showed normal filling pressure with low cardiac index of 1.9 and 75% mid to distal LAD stenosis.  Cardiology recommended medical management and titrated GDMT.  Patient also had cardiac MRI that showed mildly dilated LV with mild concentric LVH, LVEF of 23%, diffuse hypokinesis, mildly  decreased RVSF and possible moderate MR. No definite evidence of MI.  On the day of discharge, patient felt well.  Cleared for discharge on GDMT as below by cardiology on ASA 81, Crestor 20, Coreg 3.125 mg bid, Entresto 97/103 bid, spironolactone 25 daily, Farxiga 10 daily.  Cardiology to arrange outpatient follow-up.  Extensively counseled on the importance of refraining from drinking alcohol and using cocaine.  See individual problem list below for more.   Problems addressed during this hospitalization Syncope and collapse: Likely due to heart failure.  History of cocaine use but denies recent use in 10 days.  Also reports alcohol use.  TTE with LVEF of 20 to 25%, G1-DD, mild AS and mildly reduced RVSF.  CT angio negative for PE.  TSH normal.  R/LHC and cardiac MRI as above. -Cleared for discharge by cardiology -Consult to refrain from drinking alcohol and using cocaine   Acute combined HF: TTE as above.  BNP normal.  Currently asymptomatic except for some shortness of breath.  Denies orthopnea or PND. Appears euvolemic.  Not on diuretics.  I&O incomplete.  Cardiac workup including TTE, R/LHC and cardiac MRI as above. -Cleared for discharge on GDMT as above.   Mild to mod aortic stenosis: Noted on TTE. -Per cardiology   NSVT: Noted on telemetry the day of admission -Discharged on Coreg.   Elevated troponin/coronary artery calcification/LAD stenosis: Noted on CTA chest.  LHC as above.  No significant delta.  A1c 6.0% on 10/15.  LDL 95 in 05/2022 -Cardiology recommended medical management.   Essential hypertension: BP elevated but improved. -Cardiac meds per cardiology   Polysubstance use: Admitted using cocaine,  marijuana and alcohol -Counseled to refrain  CKD-3A: AKI ruled out.  Baseline Cr ~1.3.  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 -Recommend annual screening of the ascending thoracic in outpatient  setting -Recommend 3-year screening noted for the mid abdominal aneurysm   Elevated liver function test: Likely congestive hepatopathy.  Improving. -Check CMP at follow-up.   Dyslipidemia: Labs from 4/11 noted total cholesterol 148, HDL  25.7, LDL 95, triglycerides 236.  -Discharged on Crestor.   GERD -PPI   Hypokalemia: Resolved.   Obesity Body mass index is 30.61 kg/m. -Encourage lifestyle change to lose weight             Time spent 35 minutes  Vital signs Vitals:   12/20/22 2311 12/21/22 0458 12/21/22 0723 12/21/22 1134  BP: 105/76 109/73 (!) 136/93 (!) 132/95  Pulse: 71 75 76 73  Temp: 98 F (36.7 C) 97.6 F (36.4 C) 97.7 F (36.5 C) (!) 97.5 F (36.4 C)  Resp: 20 19 12 12   Height:      Weight:  94 kg    SpO2: 95% 97% 96% 99%  TempSrc: Oral Oral Oral Oral  BMI (Calculated):  30.6       Discharge exam  GENERAL: No apparent distress.  Nontoxic. HEENT: MMM.  Vision and hearing grossly intact.  NECK: Supple.  No apparent JVD.  RESP:  No IWOB.  Fair aeration bilaterally. CVS:  RRR. Heart sounds normal.  ABD/GI/GU: BS+. Abd soft, NTND.  MSK/EXT:  Moves extremities. No apparent deformity. No edema.  SKIN: no apparent skin lesion or wound NEURO: Awake and alert. Oriented appropriately.  No apparent focal neuro deficit. PSYCH: Calm. Normal affect.   Discharge Instructions Discharge Instructions     Diet - low sodium heart healthy   Complete by: As directed    Discharge instructions   Complete by: As directed    It has been a pleasure taking care of you!  You were hospitalized due to heart failure for which you have been started on medication.  It is very important that you take your medications as prescribed.  Please review your new medication list and the directions on your medications before you take them.  Follow-up with cardiology per their recommendation.  Is very important that you do not drink alcohol or use cocaine!   Take care,   Increase  activity slowly   Complete by: As directed       Allergies as of 12/21/2022       Reactions   Morphine And Codeine Nausea And Vomiting   Other Nausea And Vomiting   Buprenorphine Hcl Nausea And Vomiting        Medication List     STOP taking these medications    ondansetron 4 MG disintegrating tablet Commonly known as: ZOFRAN-ODT       TAKE these medications    acetaminophen 325 MG tablet Commonly known as: TYLENOL Take 650 mg by mouth daily as needed for mild pain (pain score 1-3) (Shoulder arthritis).   aspirin 81 MG chewable tablet Chew 1 tablet (81 mg total) by mouth daily.   carvedilol 3.125 MG tablet Commonly known as: COREG Take 1 tablet (3.125 mg total) by mouth 2 (two) times daily with a meal.   Cyanocobalamin 500 MCG Lozg Take 500 mcg by mouth daily.   Entresto 97-103 MG Generic drug: sacubitril-valsartan Take 1 tablet by mouth 2 (two) times  daily.   Farxiga 10 MG Tabs tablet Generic drug: dapagliflozin propanediol Take 1 tablet (10 mg total) by mouth daily.   multivitamin with minerals tablet Take 1 tablet by mouth daily. Mens Centrum   omeprazole 40 MG capsule Commonly known as: PRILOSEC Take 1 capsule (40 mg total) by mouth daily.   rosuvastatin 20 MG tablet Commonly known as: Crestor Take 1 tablet (20 mg total) by mouth daily.   spironolactone 25 MG tablet Commonly known as: ALDACTONE Take 1 tablet (25 mg total) by mouth daily.   thiamine 100 MG tablet Commonly known as: VITAMIN B1 Take 1 tablet (100 mg total) by mouth daily.        Consultations: Cardiology  Procedures/Studies: 10/6-R/LHC showed normal filling pressure with low cardiac index of 1.9 and 75% mid to distal LAD stenosis.   MR CARDIAC VELOCITY FLOW MAP  Result Date: 12/21/2022 CLINICAL DATA:  Cardiomyopathy of uncertain etiology EXAM: CARDIAC MRI TECHNIQUE: The patient was scanned on a 1.5 Tesla GE magnet. A dedicated cardiac coil was used. Functional imaging  was done using Fiesta sequences. 2,3, and 4 chamber views were done to assess for RWMA's. Modified Simpson's rule using a short axis stack was used to calculate an ejection fraction on a dedicated work [employer], [employer]. The patient received 10 cc of Gadavist. After 10 minutes inversion recovery sequences were used to assess for infiltration and scar tissue. FINDINGS: Limited images of the lung fields showed no gross abnormalities. Ascending aorta mildly dilated to 4.0 cm. Mildly dilated left ventricle with mild LV hypertrophy. Diffuse hypokinesis appearing worse in septum, LV EF 23%. Normal right ventricular size with RV EF 42%. Moderate left atrial enlargement. Normal right atrium. Thickened aortic valve, cannot rule out bicuspid. Mild aortic valve restriction, mild aortic insufficiency with regurgitant fraction 18%. Mitral regurgitant fraction 25% suggests moderate mitral regurgitation though this was not seen visually. DELAYED ENHANCEMENT IMAGING: DELAYED ENHANCEMENT IMAGING Confluent mid-wall late gadolinium enhancement (LGE) in the basal anteroseptal and basal inferoseptal walls. Focal mid-wall LGE in the mid inferoseptal wall. Focal mid-wall LGE in the mid inferolateral wall. There is an area of full thickness/transmural LGE in the apical lateral wall. MEASUREMENTS: MEASUREMENTS LVEDV 237 mL LVEDVi 110 mL/m2 LVSV 54 mL LVEF 23% RVEDV 166 mL RVEDVi 77 mL/m2 RVSV 69 mL RVEF 42% Aortic forward volume 40 mL Aortic regurgitant fraction 18% T1 1036, ECV 25% T2 global 47 IMPRESSION: 1. Mildly dilated LV with mild concentric LV hypertrophy. EF 23%, diffuse hypokinesis appeared worse in septum. 2. Normal RV size with mildly decreased systolic function, RV EF 42%. 3. Aortic valve not well-visualized, may be bicuspid. Mild aortic insufficiency with regurgitant fraction 18%. Mild restriction. 4. Mitral regurgitant fraction suggested moderate mitral regurgitation but this was not seen visually. 5. Primarily a  mid-wall LGE pattern suggesting a non-ischemic etiology, possible prior myocarditis. However, there is transmural scar in the apical late lateral wall, cannot rule out prior MI. 6.  Extracellular volume percentage not significantly elevated. Dalton Mclean Electronically Signed   By: Marca Ancona M.D.   On: 12/21/2022 13:03   MR CARDIAC VELOCITY FLOW MAP  Result Date: 12/21/2022 CLINICAL DATA:  Cardiomyopathy of uncertain etiology EXAM: CARDIAC MRI TECHNIQUE: The patient was scanned on a 1.5 Tesla GE magnet. A dedicated cardiac coil was used. Functional imaging was done using Fiesta sequences. 2,3, and 4 chamber views were done to assess for RWMA's. Modified Simpson's rule using a short axis stack was used to calculate an ejection fraction  on a dedicated work [employer] officer, [employer]. The patient received 10 cc of Gadavist. After 10 minutes inversion recovery sequences were used to assess for infiltration and scar tissue. FINDINGS: Limited images of the lung fields showed no gross abnormalities. Ascending aorta mildly dilated to 4.0 cm. Mildly dilated left ventricle with mild LV hypertrophy. Diffuse hypokinesis appearing worse in septum, LV EF 23%. Normal right ventricular size with RV EF 42%. Moderate left atrial enlargement. Normal right atrium. Thickened aortic valve, cannot rule out bicuspid. Mild aortic valve restriction, mild aortic insufficiency with regurgitant fraction 18%. Mitral regurgitant fraction 25% suggests moderate mitral regurgitation though this was not seen visually. DELAYED ENHANCEMENT IMAGING: DELAYED ENHANCEMENT IMAGING Confluent mid-wall late gadolinium enhancement (LGE) in the basal anteroseptal and basal inferoseptal walls. Focal mid-wall LGE in the mid inferoseptal wall. Focal mid-wall LGE in the mid inferolateral wall. There is an area of full thickness/transmural LGE in the apical lateral wall. MEASUREMENTS: MEASUREMENTS LVEDV 237 mL LVEDVi 110 mL/m2 LVSV 54 mL LVEF 23% RVEDV  166 mL RVEDVi 77 mL/m2 RVSV 69 mL RVEF 42% Aortic forward volume 40 mL Aortic regurgitant fraction 18% T1 1036, ECV 25% T2 global 47 IMPRESSION: 1. Mildly dilated LV with mild concentric LV hypertrophy. EF 23%, diffuse hypokinesis appeared worse in septum. 2. Normal RV size with mildly decreased systolic function, RV EF 42%. 3. Aortic valve not well-visualized, may be bicuspid. Mild aortic insufficiency with regurgitant fraction 18%. Mild restriction. 4. Mitral regurgitant fraction suggested moderate mitral regurgitation but this was not seen visually. 5. Primarily a mid-wall LGE pattern suggesting a non-ischemic etiology, possible prior myocarditis. However, there is transmural scar in the apical late lateral wall, cannot rule out prior MI. 6.  Extracellular volume percentage not significantly elevated. Dalton Mclean Electronically Signed   By: Marca Ancona M.D.   On: 12/21/2022 13:03   MR CARDIAC MORPHOLOGY W WO CONTRAST  Result Date: 12/21/2022 CLINICAL DATA:  Cardiomyopathy of uncertain etiology EXAM: CARDIAC MRI TECHNIQUE: The patient was scanned on a 1.5 Tesla GE magnet. A dedicated cardiac coil was used. Functional imaging was done using Fiesta sequences. 2,3, and 4 chamber views were done to assess for RWMA's. Modified Simpson's rule using a short axis stack was used to calculate an ejection fraction on a dedicated work [employer] officer, [employer]. The patient received 10 cc of Gadavist. After 10 minutes inversion recovery sequences were used to assess for infiltration and scar tissue. FINDINGS: Limited images of the lung fields showed no gross abnormalities. Ascending aorta mildly dilated to 4.0 cm. Mildly dilated left ventricle with mild LV hypertrophy. Diffuse hypokinesis appearing worse in septum, LV EF 23%. Normal right ventricular size with RV EF 42%. Moderate left atrial enlargement. Normal right atrium. Thickened aortic valve, cannot rule out bicuspid. Mild aortic valve restriction, mild  aortic insufficiency with regurgitant fraction 18%. Mitral regurgitant fraction 25% suggests moderate mitral regurgitation though this was not seen visually. DELAYED ENHANCEMENT IMAGING: DELAYED ENHANCEMENT IMAGING Confluent mid-wall late gadolinium enhancement (LGE) in the basal anteroseptal and basal inferoseptal walls. Focal mid-wall LGE in the mid inferoseptal wall. Focal mid-wall LGE in the mid inferolateral wall. There is an area of full thickness/transmural LGE in the apical lateral wall. MEASUREMENTS: MEASUREMENTS LVEDV 237 mL LVEDVi 110 mL/m2 LVSV 54 mL LVEF 23% RVEDV 166 mL RVEDVi 77 mL/m2 RVSV 69 mL RVEF 42% Aortic forward volume 40 mL Aortic regurgitant fraction 18% T1 1036, ECV 25% T2 global 47 IMPRESSION: 1. Mildly dilated LV with mild concentric LV hypertrophy.  EF 23%, diffuse hypokinesis appeared worse in septum. 2. Normal RV size with mildly decreased systolic function, RV EF 42%. 3. Aortic valve not well-visualized, may be bicuspid. Mild aortic insufficiency with regurgitant fraction 18%. Mild restriction. 4. Mitral regurgitant fraction suggested moderate mitral regurgitation but this was not seen visually. 5. Primarily a mid-wall LGE pattern suggesting a non-ischemic etiology, possible prior myocarditis. However, there is transmural scar in the apical late lateral wall, cannot rule out prior MI. 6.  Extracellular volume percentage not significantly elevated. Dalton Mclean Electronically Signed   By: Marca Ancona M.D.   On: 12/21/2022 13:02   CARDIAC CATHETERIZATION  Result Date: 12/20/2022 1. Normal filling pressures. 2. Low cardiac index 1.9. 3. 75% mid to distal LAD stenosis.  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  He has no chest pain.  Would manage medically for now, can consider PCI if he develops anginal-type pain.   US Abdomen Limited RUQ (LIVER/GB)  Result Date: 12/19/2022 CLINICAL DATA:  Heart failure EXAM:  ULTRASOUND ABDOMEN LIMITED RIGHT UPPER QUADRANT COMPARISON:  06/29/2022 FINDINGS: Gallbladder: No gallstones or wall thickening visualized. No sonographic Murphy sign noted by sonographer. Common bile duct: Diameter: Normal caliber, 5 mm Liver: No focal lesion identified. Within normal limits in parenchymal echogenicity. Portal vein is patent on color Doppler imaging with normal direction of blood flow towards the liver. Other: None. IMPRESSION: Normal study. Electronically Signed   By: Charlett Nose M.D.   On: 12/19/2022 03:58   ECHOCARDIOGRAM COMPLETE  Result Date: 12/18/2022    ECHOCARDIOGRAM REPORT   Patient Name:   Travis Shea Date of Exam: 12/18/2022 Medical Rec #:  [id number]       Height:       69.0 in Accession #:    [id number]      Weight:       210.0 lb Date of Birth:  1955/04/01      BSA:          2.109 m Patient Age:    67 years        BP:           146/89 mmHg Patient Gender: M               HR:           70 bpm. Exam Location:  Inpatient Procedure: 2D Echo, Cardiac Doppler and Color Doppler Indications:     R55 Syncope  History:         Patient has no prior history of Echocardiogram examinations.                  Signs/Symptoms:Syncope; Risk Factors:Hypertension.  Sonographer:     Eulah Pont RDCS Referring Phys:  Angie Fava Diagnosing Phys: Arvilla Meres MD IMPRESSIONS  1. Left ventricular ejection fraction, by estimation, is 20 to 25%. The left ventricle has severely decreased function. The left ventricle demonstrates global hypokinesis. The left ventricular internal cavity size was moderately dilated. Left ventricular diastolic parameters are consistent with Grade I diastolic dysfunction (impaired relaxation).  2. Right ventricular systolic function is mildly reduced. The right ventricular size is normal. There is normal pulmonary artery systolic pressure.  3. Left atrial size was mildly dilated.  4. The mitral valve is normal in structure. Trivial mitral valve regurgitation. No  evidence of mitral stenosis.  5. Cannot exclude bicuspid aortic valve.Degree of AS may be underestimated due to severe LV dysfunction. The aortic valve  was not well visualized. There is moderate calcification of the aortic valve. Aortic valve regurgitation is mild. Mild to moderate aortic valve stenosis. Aortic valve area, by VTI measures 1.25 cm. Aortic valve mean gradient measures 12.0 mmHg. Aortic valve Vmax measures 2.34 m/s.  6. Aortic dilatation noted. There is mild dilatation of the aortic root, measuring 43 mm. There is mild dilatation of the aortic root, measuring 43 mm.  7. The inferior vena cava is normal in size with greater than 50% respiratory variability, suggesting right atrial pressure of 3 mmHg. FINDINGS  Left Ventricle: Left ventricular ejection fraction, by estimation, is 20 to 25%. The left ventricle has severely decreased function. The left ventricle demonstrates global hypokinesis. The left ventricular internal cavity size was moderately dilated. There is no left ventricular hypertrophy. Left ventricular diastolic parameters are consistent with Grade I diastolic dysfunction (impaired relaxation). Right Ventricle: The right ventricular size is normal. No increase in right ventricular wall thickness. Right ventricular systolic function is mildly reduced. There is normal pulmonary artery systolic pressure. The tricuspid regurgitant velocity is 1.86 m/s, and with an assumed right atrial pressure of 3 mmHg, the estimated right ventricular systolic pressure is 16.8 mmHg. Left Atrium: Left atrial size was mildly dilated. Right Atrium: Right atrial size was normal in size. Pericardium: There is no evidence of pericardial effusion. Mitral Valve: The mitral valve is normal in structure. Trivial mitral valve regurgitation. No evidence of mitral valve stenosis. Tricuspid Valve: The tricuspid valve is normal in structure. Tricuspid valve regurgitation is trivial. No evidence of tricuspid stenosis. Aortic  Valve: Cannot exclude bicuspid aortic valve.Degree of AS may be underestimated due to severe LV dysfunction. The aortic valve was not well visualized. There is moderate calcification of the aortic valve. Aortic valve regurgitation is mild. Aortic regurgitation PHT measures 533 msec. Mild to moderate aortic stenosis is present. Aortic valve mean gradient measures 12.0 mmHg. Aortic valve peak gradient measures 21.9 mmHg. Aortic valve area, by VTI measures 1.25 cm. Pulmonic Valve: The pulmonic valve was normal in structure. Pulmonic valve regurgitation is not visualized. No evidence of pulmonic stenosis. Aorta: Aortic dilatation noted. There is mild dilatation of the aortic root, measuring 43 mm. There is mild dilatation of the aortic root, measuring 43 mm. Venous: The inferior vena cava is normal in size with greater than 50% respiratory variability, suggesting right atrial pressure of 3 mmHg. IAS/Shunts: No atrial level shunt detected by color flow Doppler.  LEFT VENTRICLE PLAX 2D LVIDd:         6.50 cm      Diastology LVIDs:         4.90 cm      LV e' medial:    4.78 cm/s LV PW:         0.90 cm      LV E/e' medial:  14.3 LV IVS:        0.90 cm      LV e' lateral:   7.26 cm/s LVOT diam:     2.10 cm      LV E/e' lateral: 9.4 LV SV:         52 LV SV Index:   25 LVOT Area:     3.46 cm  LV Volumes (MOD) LV vol d, MOD A2C: 181.0 ml LV vol d, MOD A4C: 186.0 ml LV vol s, MOD A2C: 118.0 ml LV vol s, MOD A4C: 105.0 ml LV SV MOD A2C:     63.0 ml LV SV MOD A4C:  186.0 ml LV SV MOD BP:      73.5 ml RIGHT VENTRICLE RV S prime:     11.50 cm/s TAPSE (M-mode): 2.1 cm LEFT ATRIUM             Index        RIGHT ATRIUM           Index LA diam:        5.20 cm 2.47 cm/m   RA Area:     15.50 cm LA Vol (A2C):   90.0 ml 42.67 ml/m  RA Volume:   39.90 ml  18.92 ml/m LA Vol (A4C):   59.2 ml 28.07 ml/m LA Biplane Vol: 72.9 ml 34.56 ml/m  AORTIC VALVE AV Area (Vmax):    1.17 cm AV Area (Vmean):   1.07 cm AV Area (VTI):     1.25 cm  AV Vmax:           234.00 cm/s AV Vmean:          160.250 cm/s AV VTI:            0.417 m AV Peak Grad:      21.9 mmHg AV Mean Grad:      12.0 mmHg LVOT Vmax:         79.30 cm/s LVOT Vmean:        49.700 cm/s LVOT VTI:          0.150 m LVOT/AV VTI ratio: 0.36 AI PHT:            533 msec  AORTA Ao Root diam: 4.30 cm Ao Asc diam:  4.30 cm MITRAL VALVE               TRICUSPID VALVE MV Area (PHT): 3.72 cm    TR Peak grad:   13.8 mmHg MV Decel Time: 204 msec    TR Vmax:        186.00 cm/s MV E velocity: 68.15 cm/s MV A velocity: 82.25 cm/s  SHUNTS MV E/A ratio:  0.83        Systemic VTI:  0.15 m                            Systemic Diam: 2.10 cm Arvilla Meres MD Electronically signed by Arvilla Meres MD Signature Date/Time: 12/18/2022/10:25:06 AM    Final (Updated)    US Aorta  Result Date: 12/18/2022 CLINICAL DATA:  Syncope EXAM: ULTRASOUND OF ABDOMINAL AORTA TECHNIQUE: Ultrasound examination of the abdominal aorta and proximal common iliac arteries was performed to evaluate for aneurysm. Additional color and Doppler images of the distal aorta were obtained to document patency. COMPARISON:  Chest CT today FINDINGS: Abdominal aortic measurements as follows: Proximal:  2.6 cm Mid:  3.3 cm Distal:  2.3 cm Patent: Yes, peak systolic velocity is 81 cm/s Right common iliac artery: 1.9 cm Left common iliac artery: 1.3 cm IMPRESSION: 3.3 cm mid abdominal aortic aneurysm. Recommend follow-up ultrasound every 3 years. (Ref.: J Vasc Surg. 2018; 67:2-77 and J Am Coll Radiol 2013;10(10):789-794.) Electronically Signed   By: Charlett Nose M.D.   On: 12/18/2022 03:00   CT Head Wo Contrast  Result Date: 12/18/2022 CLINICAL DATA:  Syncope found down EXAM: CT HEAD WITHOUT CONTRAST CT CERVICAL SPINE WITHOUT CONTRAST TECHNIQUE: Multidetector CT imaging of the head and cervical spine was performed following the standard protocol without intravenous contrast. Multiplanar CT image reconstructions of the cervical spine were also  generated. RADIATION DOSE  REDUCTION: This exam was performed according to the departmental dose-optimization program which includes automated exposure control, adjustment of the mA and/or kV according to patient size and/or use of iterative reconstruction technique. COMPARISON:  None Available. FINDINGS: CT HEAD FINDINGS Brain: No evidence of acute infarction, hemorrhage, hydrocephalus, extra-axial collection or mass lesion/mass effect. Subcortical white matter and periventricular small vessel ischemic changes. Vascular: No hyperdense vessel or unexpected calcification. Skull: Normal. Negative for fracture or focal lesion. Sinuses/Orbits: The visualized paranasal sinuses are essentially clear. The mastoid air cells are unopacified. Other: None. CT CERVICAL SPINE FINDINGS Alignment: Normal cervical lordosis. Skull base and vertebrae: No acute fracture. No primary bone lesion or focal pathologic process. Soft tissues and spinal canal: No prevertebral fluid or swelling. No visible canal hematoma. Disc levels: Mild degenerative changes of the mid cervical spine with bulky anterior osteophytosis. Spinal canal is patent. Upper chest: Visualized lung apices are clear. Other: Visualized thyroid is unremarkable with IMPRESSION: No acute intracranial abnormality. Small vessel ischemic changes. No traumatic injury to the cervical spine. Mild degenerative changes. Electronically Signed   By: Charline Bills M.D.   On: 12/18/2022 02:11   CT Cervical Spine Wo Contrast  Result Date: 12/18/2022 CLINICAL DATA:  Syncope found down EXAM: CT HEAD WITHOUT CONTRAST CT CERVICAL SPINE WITHOUT CONTRAST TECHNIQUE: Multidetector CT imaging of the head and cervical spine was performed following the standard protocol without intravenous contrast. Multiplanar CT image reconstructions of the cervical spine were also generated. RADIATION DOSE REDUCTION: This exam was performed according to the departmental dose-optimization program which  includes automated exposure control, adjustment of the mA and/or kV according to patient size and/or use of iterative reconstruction technique. COMPARISON:  None Available. FINDINGS: CT HEAD FINDINGS Brain: No evidence of acute infarction, hemorrhage, hydrocephalus, extra-axial collection or mass lesion/mass effect. Subcortical white matter and periventricular small vessel ischemic changes. Vascular: No hyperdense vessel or unexpected calcification. Skull: Normal. Negative for fracture or focal lesion. Sinuses/Orbits: The visualized paranasal sinuses are essentially clear. The mastoid air cells are unopacified. Other: None. CT CERVICAL SPINE FINDINGS Alignment: Normal cervical lordosis. Skull base and vertebrae: No acute fracture. No primary bone lesion or focal pathologic process. Soft tissues and spinal canal: No prevertebral fluid or swelling. No visible canal hematoma. Disc levels: Mild degenerative changes of the mid cervical spine with bulky anterior osteophytosis. Spinal canal is patent. Upper chest: Visualized lung apices are clear. Other: Visualized thyroid is unremarkable with IMPRESSION: No acute intracranial abnormality. Small vessel ischemic changes. No traumatic injury to the cervical spine. Mild degenerative changes. Electronically Signed   By: Charline Bills M.D.   On: 12/18/2022 02:11   CT Angio Chest PE W and/or Wo Contrast  Result Date: 12/18/2022 CLINICAL DATA:  Pulmonary embolism, syncope EXAM: CT ANGIOGRAPHY CHEST WITH CONTRAST TECHNIQUE: Multidetector CT imaging of the chest was performed using the standard protocol during bolus administration of intravenous contrast. Multiplanar CT image reconstructions and MIPs were obtained to evaluate the vascular anatomy. RADIATION DOSE REDUCTION: This exam was performed according to the departmental dose-optimization program which includes automated exposure control, adjustment of the mA and/or kV according to patient size and/or use of iterative  reconstruction technique. CONTRAST:  75mL OMNIPAQUE IOHEXOL 350 MG/ML SOLN COMPARISON:  None Available. FINDINGS: Cardiovascular: Opacification of the pulmonary arterial tree is slightly suboptimal, however, the examination is still diagnostic for the exclusion of acute pulmonary embolism through the segmental level. No intraluminal filling defects are identified. The pulmonary arteries are of normal caliber. Moderate predominantly left anterior  descending coronary artery calcification is present. Mild global cardiomegaly with hypertrophy and dilation of the left ventricle. No pericardial effusion. The thoracic aorta is diffusely dilated measuring up to 4.3 cm in diameter in its ascending segment and 3.4 cm in diameter in its proximal descending segment. The distal aorta measures 3.1 cm in diameter at the level of the diaphragmatic hiatus. Mediastinum/Nodes: No enlarged mediastinal, hilar, or axillary lymph nodes. Thyroid gland, trachea, and esophagus demonstrate no significant findings. Small hiatal hernia. Lungs/Pleura: Lungs are clear. No pleural effusion or pneumothorax. Upper Abdomen: No acute abnormality. Musculoskeletal: No chest wall abnormality. No acute or significant osseous findings. Review of the MIP images confirms the above findings. IMPRESSION: 1. No pulmonary embolism. 2. Moderate coronary artery calcification. Mild global cardiomegaly with hypertrophy and dilation of the left ventricle. 3. 4.3 cm ascending thoracic aortic aneurysm. Recommend annual imaging followup by CTA or MRA. This recommendation follows 2010 ACCF/AHA/AATS/ACR/ASA/SCA/SCAI/SIR/STS/SVM Guidelines for the Diagnosis and Management of Patients with Thoracic Aortic Disease. Circulation. 2010; 121: W098-J191. Aortic aneurysm NOS (ICD10-I71.9) 4. Small hiatal hernia. Electronically Signed   By: Helyn Numbers M.D.   On: 12/18/2022 01:09   DG Chest 2 View  Result Date: 12/17/2022 CLINICAL DATA:  Wheezing, diagnosed with COVID 3-4  weeks ago EXAM: CHEST - 2 VIEW COMPARISON:  None Available. FINDINGS: Lungs are clear.  No pleural effusion or pneumothorax. The heart is normal in size. Degenerative changes of the visualized thoracolumbar spine. IMPRESSION: Normal chest radiographs. Electronically Signed   By: Charline Bills M.D.   On: 12/17/2022 23:13   DG Chest 2 View  Result Date: 12/17/2022 CLINICAL DATA:  Shortness of breath EXAM: CHEST - 2 VIEW COMPARISON:  11/28/2022 FINDINGS: Lungs are clear.  No pleural effusion or pneumothorax. The heart is normal in size. Degenerative changes of the visualized thoracolumbar spine. IMPRESSION: Normal chest radiographs. Electronically Signed   By: Charline Bills M.D.   On: 12/17/2022 23:06   XR Shoulder Right  Result Date: 12/14/2022 X-rays of the right shoulder show no acute findings.      The results of significant diagnostics from this hospitalization (including imaging, microbiology, ancillary and laboratory) are listed below for reference.     Microbiology: No results found for this or any previous visit (from the past 240 hour(s)).   Labs:  CBC: Recent Labs  Lab 12/17/22 2226 12/18/22 0644 12/19/22 0659 12/20/22 1328 12/20/22 1427  WBC 9.0 9.3 9.7  --  11.7*  NEUTROABS 5.8 5.9  --   --   --   HGB 15.0 15.1 16.5 18.4*  18.7* 17.1*  HCT 45.2 47.0 49.3 54.0*  55.0* 51.2  MCV 88.6 93.1 88.2  --  86.3  PLT 309 250 331  --  363   BMP &GFR Recent Labs  Lab 12/17/22 2226 12/18/22 0644 12/19/22 0659 12/20/22 0321 12/20/22 1328 12/20/22 1427 12/21/22 0341  NA 135 135 135 136 135  137  --  134*  K 3.4* 4.0 4.1 4.3 4.0  4.3  --  4.0  CL 102 104 104 103  --   --  104  CO2 22 23 24 25   --   --  22  GLUCOSE 133* 115* 104* 103*  --   --  125*  BUN 12 12 16 23   --   --  23  CREATININE 1.52* 1.52* 1.50* 1.54*  --  1.29* 1.44*  CALCIUM 9.7 9.5 9.9 9.6  --   --  9.2  MG  --  2.0  2.3  --   --   --   --    Estimated Creatinine Clearance: 55.6 mL/min (A)  (by C-G formula based on SCr of 1.44 mg/dL (H)). Liver & Pancreas: Recent Labs  Lab 12/18/22 0644 12/19/22 0659  AST 51* 44*  ALT 51* 48*  ALKPHOS 36* 39  BILITOT 0.9 0.8  PROT 6.3* 6.3*  ALBUMIN 3.3* 3.3*   Recent Labs  Lab 12/19/22 0659  LIPASE 50   No results for input(s): "AMMONIA" in the last 168 hours. Diabetic: No results for input(s): "HGBA1C" in the last 72 hours. Recent Labs  Lab 12/17/22 2234  GLUCAP 145*   Cardiac Enzymes: No results for input(s): "CKTOTAL", "CKMB", "CKMBINDEX", "TROPONINI" in the last 168 hours. No results for input(s): "PROBNP" in the last 8760 hours. Coagulation Profile: No results for input(s): "INR", "PROTIME" in the last 168 hours. Thyroid Function Tests: No results for input(s): "TSH", "T4TOTAL", "FREET4", "T3FREE", "THYROIDAB" in the last 72 hours. Lipid Profile: Recent Labs    12/20/22 0321  CHOL 202*  HDL 28*  LDLCALC 102*  TRIG 361*  CHOLHDL 7.2   Anemia Panel: No results for input(s): "VITAMINB12", "FOLATE", "FERRITIN", "TIBC", "IRON", "RETICCTPCT" in the last 72 hours. Urine analysis:    Component Value Date/Time   COLORURINE YELLOW 12/18/2022 0027   APPEARANCEUR HAZY (A) 12/18/2022 0027   LABSPEC 1.024 12/18/2022 0027   PHURINE 5.0 12/18/2022 0027   GLUCOSEU NEGATIVE 12/18/2022 0027   HGBUR NEGATIVE 12/18/2022 0027   BILIRUBINUR NEGATIVE 12/18/2022 0027   KETONESUR NEGATIVE 12/18/2022 0027   PROTEINUR NEGATIVE 12/18/2022 0027   NITRITE NEGATIVE 12/18/2022 0027   LEUKOCYTESUR NEGATIVE 12/18/2022 0027   Sepsis Labs: Invalid input(s): "PROCALCITONIN", "LACTICIDVEN"   SIGNED:  Almon Hercules, MD  [facility] 12/22/2022, 4:23 PM

## 2022-12-27 ENCOUNTER — Other Ambulatory Visit (HOSPITAL_COMMUNITY): Payer: Self-pay

## 2022-12-27 ENCOUNTER — Encounter (HOSPITAL_COMMUNITY): Payer: Self-pay | Admitting: Cardiology

## 2022-12-27 ENCOUNTER — Ambulatory Visit (HOSPITAL_COMMUNITY)
Admit: 2022-12-27 | Discharge: 2022-12-27 | Disposition: A | Discharge status: Home / Self Care | Payer: No Typology Code available for payment source | Source: Ambulatory Visit | Attending: Cardiology | Admitting: Cardiology

## 2022-12-27 VITALS — BP 110/70 | HR 71 | Wt 208.0 lb

## 2022-12-27 DIAGNOSIS — R9431 Abnormal electrocardiogram [ECG] [EKG]: Secondary | ICD-10-CM | POA: Diagnosis not present

## 2022-12-27 DIAGNOSIS — I5042 Chronic combined systolic (congestive) and diastolic (congestive) heart failure: Secondary | ICD-10-CM | POA: Diagnosis not present

## 2022-12-27 DIAGNOSIS — I5043 Acute on chronic combined systolic (congestive) and diastolic (congestive) heart failure: Secondary | ICD-10-CM | POA: Diagnosis not present

## 2022-12-27 LAB — BASIC METABOLIC PANEL
Anion gap: 7 (ref 5–15)
BUN: 14 mg/dL (ref 8–23)
CO2: 26 mmol/L (ref 22–32)
Calcium: 10.1 mg/dL (ref 8.9–10.3)
Chloride: 107 mmol/L (ref 98–111)
Creatinine, Ser: 1.47 mg/dL — ABNORMAL HIGH (ref 0.61–1.24)
GFR, Estimated: 52 mL/min — ABNORMAL LOW (ref >60)
Glucose, Bld: 103 mg/dL — ABNORMAL HIGH (ref 70–99)
Potassium: 5.3 mmol/L — ABNORMAL HIGH (ref 3.5–5.1)
Sodium: 140 mmol/L (ref 135–145)

## 2022-12-27 LAB — BRAIN NATRIURETIC PEPTIDE: B Natriuretic Peptide: 69.2 pg/mL (ref 0.0–100.0)

## 2022-12-27 MED ORDER — CARVEDILOL 6.25 MG PO TABS
6.2500 mg | ORAL_TABLET | Freq: Two times a day (BID) | ORAL | 11 refills | Status: DC
  Filled 2022-12-27: qty 60, 30d supply, fill #0

## 2022-12-27 NOTE — Patient Instructions (Signed)
INCREASE Carvedilol to 6.25 mg Twice daily  Labs done today, your results will be available in MyChart, we will contact you for abnormal readings.  Please follow up with our heart failure pharmacist AS SCHEDULED   Your physician recommends that you schedule a follow-up appointment in: 2 months (January 2025) ** PLEASE CALL THE OFFICE IN MID DECEMBER TO ARRANGE YOUR FOLLOW UP APPOINTMENT. **  If you have any questions or concerns before your next appointment please send [place] a message through [place] or call our office at [phone].    TO LEAVE A MESSAGE FOR THE NURSE SELECT OPTION 2, PLEASE LEAVE A MESSAGE INCLUDING: YOUR NAME DATE OF BIRTH CALL BACK NUMBER REASON FOR CALL**this is important as we prioritize the call backs  YOU WILL RECEIVE A CALL BACK THE SAME DAY AS LONG AS YOU CALL BEFORE 4:00 PM  At the [facility], you and your health needs are our priority. As part of our continuing mission to provide you with exceptional heart care, we have created designated Provider Care Teams. These Care Teams include your primary Cardiologist (physician) and Advanced Practice Providers (APPs- Physician Assistants and Nurse Practitioners) who all work together to provide you with the care you need, when you need it.   You may see any of the following providers on your designated Care Team at your next follow up: Dr Arvilla Meres Dr Marca Ancona Dr. Dorthula Nettles Dr. Clearnce Hasten Amy Filbert Schilder, NP Robbie Lis, [place] [facility] [place], [place] Brynda Peon, NP [place] Lee, NP Karle Plumber, PharmD   Please be sure to bring in all your medications bottles to every appointment.    Thank you for choosing [facility]

## 2022-12-28 NOTE — Progress Notes (Signed)
PCP: Myrlene Broker, MD Cardiology: Dr. Shirlee Latch  67 y.o. with past medical history of HTN, GERD, ETOH use, cocaine use and osteoarthritis.  Patient reports long history of alcohol and cocaine use (last use 10 days ago), and prior anabolic steroid use in 20's. Seen 11/28/22 by PCP for fatigue and new cough and was treated for pneumonia with course of Doxycyline.  Pt presented to the ED in 11/24 after syncopal event while sitting on the couch at home. During event he did hit his head on the floor. The day before had tolerated working in the yard for about 3 hours. CT head no acute findings, small vessel ischemic changes. CT PE negative, but notable for moderate coronary artery calcification and 4.3 cm ascending thoracic aortic aneurysm. Lab on admission K 3.4, Cr 1.52, AST/ALT 51/51, Trop 108>97, BNP 75.7. CXR normal. Abd US showed 3.3 cm abd aortic aneurysm. Echo showed EF 20-25%, RV mildly reduced, mild AS (cannot rule out bicuspid valve), mild AI.  Patient had gentle diuresis in the hospital and was started on GDMT for CHF. Coronary angiography showed 75% mid to distal LAD stenosis that did not explain the extent of his cardiomyopathy.  Cardiac MRI showed EF 23%, mid-wall LGE in the basal septum, mid inferoseptum, mid inferolateral wall and transmural LGE in the apical lateral wall; RV EF 42%, ECV 25%.  BP was controlled. No arrhythmias noted in the hospital, and he was sent home with Lifevest.   Patient has been doing well since discharge from hospital.  He walked 1/2 mile yesterday with no problems. Has been back to the gym.  Generally, no exertional dyspnea now.  He does fatigue relatively easily.  No orthopnea/PND. No chest pain. No palpitations or syncope.  He has not used cocaine since leaving the hospital.  He is wearing his Lifevest.   ECG (personally reviewed): NSR, LVH with repolarization abnormalities.    Labs (11/24): Lp(a) 13, K 4, creatinine 1.44, LDL 102  PMH: 1. HTN 2. GERD 3.  Cocaine abuse 4. Ascending aortic aneurysm: 4.3 cm on 11/24 CTA chest.  5. CAD: Coronary angiography (11/24) with 75% mid-distal LAD stenosis. Medically managed.  6. Chronic systolic CHF: Primarily nonischemic cardiomyopathy, ?related to HTN or cocaine.  - Echo (11/24): EF 20-25%, moderate LV dilation, mild RV dysfunction, mild-moderate AS mean gradient 12 mmHg.  - Cardiac MRI (11/24): LV EF 23%, mild LVH, mid-wall LGE in the basal septum, mid inferoseptum, mid inferolateral wall and transmural LGE in the apical lateral wall; RV EF 42%, ECV 25%.  7. Aortic stenosis: Mild-moderate on 11/24 echo, possible bicuspid.  8. Syncope: 11/24  SH: Retired, married.  H/o ETOH and cocaine abuse.   Family History  Problem Relation Age of Onset   Dementia Mother    Lung cancer Father    Other Sister        Lyme's disease   Diabetes Maternal Grandmother    Colon cancer Neg Hx    Colon polyps Neg Hx    Rectal cancer Neg Hx    Stomach cancer Neg Hx    ROS: All systems reviewed and negative except as per HPI.   General: NAD Neck: No JVD, no thyromegaly or thyroid nodule.  Lungs: Clear to auscultation bilaterally with normal respiratory effort. CV: Nondisplaced PMI.  Heart regular S1/S2, no S3/S4, 2/6 early SEM RUSB.  No peripheral edema.  No carotid bruit.  Normal pedal pulses.  Abdomen: Soft, nontender, no hepatosplenomegaly, no distention.  Skin: Intact without lesions or  rashes.  Neurologic: Alert and oriented x 3.  Psych: Normal affect. Extremities: No clubbing or cyanosis.  HEENT: Normal.   Assessment/Plan: 1. Chronic systolic CHF: Primarily nonischemic cardiomyopathy. Echo in 11/24 with  EF 20-25%, moderate LV dilation, mild-moderate AS with mean gradient 14 mmHg and AVA 1.25 cm^2, mild AI, IVC normal.  He was admitted in 11/24 due to syncopal episode.  Patient has history of untreated HTN as well as cocaine use. No strong FH of cardiomyopathy. It is possible that the cardiomyopathy is due to  cocaine/HTN.  However, he had significant LGE on cardiac MRI suggesting possibility of myocarditis or some form of infiltrative disease.  Cardiac MRI in 11/24 showed LV EF 23%, mild LVH, mid-wall LGE in the basal septum, mid inferoseptum, mid inferolateral wall and transmural LGE in the apical lateral wall; RV EF 42%, ECV 25%.  LHC/RHC in 11/24 showed normal filling pressures, low CI 1.9.  He had a 75% mid-distal LAD stenosis, but I do not think that this explains the extent of his cardiomyopathy.   NYHA class II, not volume overloaded.    - Continue Entresto 97/103 bid.  - Continue Farxiga 10 mg daily.  - Increase Coreg to 6.25 mg bid.  - Continue spironolactone 25 mg daily. BMET/BNP.  - Imperative to quit cocaine => he says that he has quit.  - Repeat echo in 3 months to assess for ICD vs recovery.  Narrow QRS so not CRT candidate.  2.  Syncope: Occurred on couch while watching TV in 11/24, no prodrome.  I worry that this was an arrhythmic event. No events on telemetry in hospital in 11/24.   - Continue Lifevest, repeat echo in 3 months to assess for need for ICD.  3. CKD stage 3: BMET today.  4. CAD: Cath in 11/24 showed 75% mid to distal LAD stenosis, otherwise nonobstructive disease.  I do not think he had ACS.  I do not think that his degree of CAD explains markedly low EF (?due to cocaine abuse vs HTN vs myocarditis).  He has no anginal-type chest pain.  Manage medically for now.  - ASA 81 daily - Continue Crestor, check lipids in 2 months.  5. Aortic stenosis: Mild to moderate on echo in 11/24.  Will need to follow going forward.  .  6. Ascending aortic dilation: 4.3 cm on CT in 11/24. Follow with time and control BP.  7. HTN: BP controlled on GDMT.  8. Cocaine use: Daily x yrs, he says he has quit since hospitalization.   Followup in 3 wks with HF pharmacist for med titration, see me in 2 months.   Marca Ancona 12/28/2022

## 2022-12-29 ENCOUNTER — Ambulatory Visit: Payer: No Typology Code available for payment source | Admitting: *Deleted

## 2022-12-29 DIAGNOSIS — Z23 Encounter for immunization: Secondary | ICD-10-CM | POA: Diagnosis not present

## 2022-12-29 NOTE — Progress Notes (Signed)
Patient is here in office to get his Hep B vaccine. Given in left deltoid. Patient tolerated well.

## 2023-01-01 ENCOUNTER — Ambulatory Visit (INDEPENDENT_AMBULATORY_CARE_PROVIDER_SITE_OTHER): Payer: No Typology Code available for payment source | Admitting: Internal Medicine

## 2023-01-01 ENCOUNTER — Encounter: Payer: Self-pay | Admitting: Internal Medicine

## 2023-01-01 VITALS — BP 122/80 | HR 73 | Temp 98.1°F | Ht 69.0 in | Wt 215.0 lb

## 2023-01-01 DIAGNOSIS — Z23 Encounter for immunization: Secondary | ICD-10-CM

## 2023-01-01 DIAGNOSIS — I5042 Chronic combined systolic (congestive) and diastolic (congestive) heart failure: Secondary | ICD-10-CM

## 2023-01-01 DIAGNOSIS — N289 Disorder of kidney and ureter, unspecified: Secondary | ICD-10-CM

## 2023-01-01 DIAGNOSIS — F1990 Other psychoactive substance use, unspecified, uncomplicated: Secondary | ICD-10-CM | POA: Diagnosis not present

## 2023-01-01 NOTE — Progress Notes (Unsigned)
   Subjective:   Patient ID: Travis Shea, male    DOB: 1955/06/21, 67 y.o.   MRN: [id number]  HPI The patient is a 67 YO man coming in for hospital follow up (in for new heart failure and syncope suspected to be related to severely low EF, likely cocaine induced). He is quit from cocaine about 1 month now. Is tired and some SOB with exertion.   PMH, [facility], social history reviewed and updated  Review of Systems  Constitutional:  Positive for activity change and fatigue.  HENT: Negative.    Eyes: Negative.   Respiratory:  Positive for shortness of breath. Negative for cough and chest tightness.   Cardiovascular:  Negative for chest pain, palpitations and leg swelling.  Gastrointestinal:  Negative for abdominal distention, abdominal pain, constipation, diarrhea, nausea and vomiting.  Musculoskeletal: Negative.   Skin: Negative.   Neurological: Negative.   Psychiatric/Behavioral: Negative.      Objective:  Physical Exam Constitutional:      Appearance: He is well-developed.  HENT:     Head: Normocephalic and atraumatic.  Cardiovascular:     Rate and Rhythm: Normal rate and regular rhythm.     Comments: Life vest in place Pulmonary:     Effort: Pulmonary effort is normal. No respiratory distress.     Breath sounds: Normal breath sounds. No wheezing or rales.  Abdominal:     General: Bowel sounds are normal. There is no distension.     Palpations: Abdomen is soft.     Tenderness: There is no abdominal tenderness. There is no rebound.  Musculoskeletal:     Cervical back: Normal range of motion.  Skin:    General: Skin is warm and dry.  Neurological:     Mental Status: He is alert and oriented to person, place, and time.     Coordination: Coordination normal.     Vitals:   01/01/23 1257  BP: 122/80  Pulse: 73  Temp: 98.1 F (36.7 C)  TempSrc: Oral  SpO2: 98%  Weight: 215 lb (97.5 kg)  Height: 5\' 9"  (1.753 m)   Visit time 25 minutes in face to face communication  with patient and coordination of care, additional 15 minutes spent in record review, coordination or care, ordering tests, communicating/referring to other healthcare professionals, documenting in medical records all on the same day of the visit for total time 40 minutes spent on the visit.   Assessment & Plan:  Flu shot  and prevnar 20 given at visit

## 2023-01-02 ENCOUNTER — Encounter: Payer: Self-pay | Admitting: Internal Medicine

## 2023-01-02 NOTE — Assessment & Plan Note (Signed)
Stable on recent labs and will continue monitoring.

## 2023-01-02 NOTE — Assessment & Plan Note (Signed)
Wearing lifevest and taking entresto and spironolactone and farxiga and coreg and last K mildly high. He will get labs in 1-2 weeks with cardiology and not due today. He is very bothered by the lifevest and unable to sleep well. He understands the reasoning for waiting 3 months then rechecking ECHO but is frustrated by it.

## 2023-01-02 NOTE — Assessment & Plan Note (Signed)
Has stopped cocaine and we discussed. He will not resume and understands how this impacted her heart.

## 2023-01-04 NOTE — Progress Notes (Addendum)
Advanced Heart Failure Clinic Note   PCP: Myrlene Broker, MD Cardiology: Dr. Shirlee Latch    HPI:  67 y.o. with past medical history of HTN, GERD, ETOH use, cocaine use and osteoarthritis.  Patient reports long history of alcohol and cocaine use, and prior anabolic steroid use in 20's. Seen 11/28/22 by PCP for fatigue and new cough and was treated for pneumonia with course of Doxycyline.  Pt presented to the ED in 12/2022 after syncopal event while sitting on the couch at home. During event he did hit his head on the floor. The day before had tolerated working in the yard for about 3 hours. CT head no acute findings, small vessel ischemic changes. CT PE negative, but notable for moderate coronary artery calcification and 4.3 cm ascending thoracic aortic aneurysm. Lab on admission K 3.4, Cr 1.52, AST/ALT 51/51, Trop 108>97, BNP 75.7. CXR normal. Abd US showed 3.3 cm abd aortic aneurysm. Echo showed EF 20-25%, RV mildly reduced, mild AS (cannot rule out bicuspid valve), mild AI.  Patient had gentle diuresis in the hospital and was started on GDMT for CHF. Coronary angiography showed 75% mid to distal LAD stenosis that did not explain the extent of his cardiomyopathy.  Cardiac MRI showed EF 23%, mid-wall LGE in the basal septum, mid inferoseptum, mid inferolateral wall and transmural LGE in the apical lateral wall; RV EF 42%, ECV 25%.  BP was controlled. No arrhythmias noted in the hospital, and he was sent home with Lifevest.    Presented to AHF Clinic with Dr. Shirlee Latch on 12/27/22. Patient had been doing well since discharge from hospital.  He had walked 1/2 mile the previous day with no problems. Had been back to the gym.  Generally, no exertional dyspnea.  He reported that he does fatigue relatively easily.  No orthopnea/PND. No chest pain. No palpitations or syncope.  He had not used cocaine since leaving the hospital.  He was wearing his Lifevest.   Today he returns to HF clinic for pharmacist  medication titration. At last visit with MD carvedilol was increased to 6.25 mg BID. Additionally, K was 5.3 on 12/27/22 BMET, he was instructed to decrease his K intake. Received flu shot and Prevnar 20 on 01/01/23.  Overall he is feeling well today. Feels like he is getting stronger. No dizziness or lightheadedness. No CP or palpitations. Has been very active. Exercises 3-4 times a week at the gym, does 1 hr 15 min work out with weights and treadmill. Is also active around the house and working in his yard. Takes breaks when he needs to. No SOB/DOE. Will be a little tired if he does too much the previous day. No LEE, PND or orthopnea. Has changed his diet significantly and is eating better. Has also been following a low K diet as instructed by Dr. Shirlee Latch.     HF Medications: Carvedilol 6.25 mg BID Entresto 97/103 mg BID Spironolactone 25 mg daily Farxiga 10 mg daily  Has the patient been experiencing any side effects to the medications prescribed?  no  Does the patient have any problems obtaining medications due to transportation or finances?   No; [facility]. Has [employer] grant for HF medications.   Understanding of regimen: excellent Understanding of indications: excellent Potential of compliance: excellent Patient understands to avoid NSAIDs. Patient understands to avoid decongestants.    Pertinent Lab Values: 01/18/23: Serum creatinine 1.51, BUN 16, Potassium 4.6, Sodium 139  Vital Signs: Weight: 218 lbs (last clinic  weight: 215 lbs) Blood pressure: 138/78  Heart rate: 70   Assessment/Plan: 1. Chronic systolic CHF: Primarily nonischemic cardiomyopathy. Echo in 12/2022 with  EF 20-25%, moderate LV dilation, mild-moderate AS with mean gradient 14 mmHg and AVA 1.25 cm^2, mild AI, IVC normal.  He was admitted in 12/2022 due to syncopal episode.  Patient has history of untreated HTN as well as cocaine use. No strong FH of cardiomyopathy. It is possible that the  cardiomyopathy is due to cocaine/HTN.  However, he had significant LGE on cardiac MRI suggesting possibility of myocarditis or some form of infiltrative disease.  Cardiac MRI in 12/2022 showed LV EF 23%, mild LVH, mid-wall LGE in the basal septum, mid inferoseptum, mid inferolateral wall and transmural LGE in the apical lateral wall; RV EF 42%, ECV 25%.  LHC/RHC in 12/2022 showed normal filling pressures, low CI 1.9.  He had a 75% mid-distal LAD stenosis, but this does not explain the extent of his cardiomyopathy.    - NYHA class II, not volume overloaded on exam.    - Increase carvedilol to 12.5 mg BID.  - Continue Entresto 97/103 mg BID.  - Continue spironolactone 25 mg daily.  - Continue Farxiga 10 mg daily.  - Repeat echo in 3 months to assess for ICD vs recovery.  Narrow QRS so not CRT candidate.  2.  Syncope: Occurred on couch while watching TV in 12/2022, no prodrome. Concern that this was an arrhythmic event. No events on telemetry in hospital in 12/2022.   - Continue Lifevest, repeat echo in 1-2 months to assess for need for ICD.  3. CKD stage 3: BMET today stable.  4. CAD: Cath in 12/2022 showed 75% mid to distal LAD stenosis, otherwise nonobstructive disease.  Per Dr. Shirlee Latch, this was unlikely to have been an ACS event. Unlikely that degree of CAD explains markedly low EF (?due to cocaine abuse vs HTN vs myocarditis).  He has no anginal-type chest pain.  Manage medically for now.  - ASA 81 daily - Continue Crestor, check lipids in 2 months.  5. Aortic stenosis: Mild to moderate on echo in 12/2022.  Will need to follow going forward.  .  6. Ascending aortic dilation: 4.3 cm on CT in 12/2022. Follow with time and control BP.  7. HTN: BP controlled on GDMT.  8. Cocaine use: Daily x yrs, he says he has quit since hospitalization.     Follow up 1 month with Dr. Shirlee Latch + echo.   Karle Plumber, PharmD, BCPS, BCCP, CPP Heart Failure Clinic Pharmacist [phone]

## 2023-01-09 ENCOUNTER — Encounter (HOSPITAL_COMMUNITY): Payer: No Typology Code available for payment source

## 2023-01-14 DIAGNOSIS — I420 Dilated cardiomyopathy: Secondary | ICD-10-CM | POA: Diagnosis not present

## 2023-01-18 ENCOUNTER — Ambulatory Visit (HOSPITAL_COMMUNITY)
Admission: RE | Admit: 2023-01-18 | Discharge: 2023-01-18 | Disposition: A | Discharge status: Home / Self Care | Payer: No Typology Code available for payment source | Source: Ambulatory Visit | Attending: Cardiology | Admitting: Cardiology

## 2023-01-18 ENCOUNTER — Other Ambulatory Visit (HOSPITAL_COMMUNITY): Payer: Self-pay

## 2023-01-18 VITALS — BP 138/78 | HR 70 | Wt 218.0 lb

## 2023-01-18 DIAGNOSIS — I5042 Chronic combined systolic (congestive) and diastolic (congestive) heart failure: Secondary | ICD-10-CM | POA: Diagnosis not present

## 2023-01-18 DIAGNOSIS — N183 Chronic kidney disease, stage 3 unspecified: Secondary | ICD-10-CM | POA: Diagnosis not present

## 2023-01-18 DIAGNOSIS — Z7984 Long term (current) use of oral hypoglycemic drugs: Secondary | ICD-10-CM | POA: Insufficient documentation

## 2023-01-18 DIAGNOSIS — I428 Other cardiomyopathies: Secondary | ICD-10-CM | POA: Diagnosis not present

## 2023-01-18 DIAGNOSIS — R5383 Other fatigue: Secondary | ICD-10-CM | POA: Diagnosis not present

## 2023-01-18 DIAGNOSIS — K219 Gastro-esophageal reflux disease without esophagitis: Secondary | ICD-10-CM | POA: Insufficient documentation

## 2023-01-18 DIAGNOSIS — I110 Hypertensive heart disease with heart failure: Secondary | ICD-10-CM | POA: Insufficient documentation

## 2023-01-18 DIAGNOSIS — Z7982 Long term (current) use of aspirin: Secondary | ICD-10-CM | POA: Insufficient documentation

## 2023-01-18 DIAGNOSIS — I712 Thoracic aortic aneurysm, without rupture, unspecified: Secondary | ICD-10-CM | POA: Diagnosis not present

## 2023-01-18 DIAGNOSIS — Z79899 Other long term (current) drug therapy: Secondary | ICD-10-CM | POA: Insufficient documentation

## 2023-01-18 DIAGNOSIS — I5022 Chronic systolic (congestive) heart failure: Secondary | ICD-10-CM | POA: Diagnosis not present

## 2023-01-18 DIAGNOSIS — I2510 Atherosclerotic heart disease of native coronary artery without angina pectoris: Secondary | ICD-10-CM | POA: Diagnosis not present

## 2023-01-18 LAB — BASIC METABOLIC PANEL
Anion gap: 6 (ref 5–15)
BUN: 16 mg/dL (ref 8–23)
CO2: 25 mmol/L (ref 22–32)
Calcium: 9.8 mg/dL (ref 8.9–10.3)
Chloride: 108 mmol/L (ref 98–111)
Creatinine, Ser: 1.51 mg/dL — ABNORMAL HIGH (ref 0.61–1.24)
GFR, Estimated: 50 mL/min — ABNORMAL LOW (ref >60)
Glucose, Bld: 113 mg/dL — ABNORMAL HIGH (ref 70–99)
Potassium: 4.6 mmol/L (ref 3.5–5.1)
Sodium: 139 mmol/L (ref 135–145)

## 2023-01-18 MED ORDER — CARVEDILOL 12.5 MG PO TABS
12.5000 mg | ORAL_TABLET | Freq: Two times a day (BID) | ORAL | 3 refills | Status: DC
  Filled 2023-01-18: qty 180, 90d supply, fill #0

## 2023-01-18 MED ORDER — ROSUVASTATIN CALCIUM 20 MG PO TABS
20.0000 mg | ORAL_TABLET | Freq: Every day | ORAL | 3 refills | Status: AC
  Filled 2023-01-18 – 2023-03-16 (×2): qty 90, 90d supply, fill #0
  Filled 2023-06-13: qty 90, 90d supply, fill #1
  Filled 2023-09-13: qty 90, 90d supply, fill #2
  Filled 2024-01-02: qty 90, 90d supply, fill #3

## 2023-01-18 MED ORDER — SPIRONOLACTONE 25 MG PO TABS
25.0000 mg | ORAL_TABLET | Freq: Every day | ORAL | 3 refills | Status: AC
  Filled 2023-01-18 – 2023-03-16 (×2): qty 90, 90d supply, fill #0
  Filled 2023-06-13: qty 90, 90d supply, fill #1
  Filled 2023-09-13: qty 90, 90d supply, fill #2
  Filled 2024-01-02: qty 90, 90d supply, fill #3

## 2023-01-18 MED ORDER — SACUBITRIL-VALSARTAN 97-103 MG PO TABS
1.0000 | ORAL_TABLET | Freq: Two times a day (BID) | ORAL | 3 refills | Status: AC
  Filled 2023-01-18 – 2023-03-16 (×2): qty 180, 90d supply, fill #0
  Filled 2023-06-14: qty 180, 90d supply, fill #1
  Filled 2023-09-13: qty 180, 90d supply, fill #2
  Filled 2024-01-02: qty 180, 90d supply, fill #3

## 2023-01-18 NOTE — Patient Instructions (Signed)
It was a pleasure seeing you today!  MEDICATIONS: -We are changing your medications today -Increase carvedilol to 12.5 mg (1 tablet) twice daily. You may take two tablets of the 6.25 mg strength twice daily until you pick up the new strength.  -Call if you have questions about your medications.  LABS: -We will call you if your labs need attention.  NEXT APPOINTMENT: Your physician recommends that you schedule a follow-up appointment with an echocardiogram in: 2 months (January 2025) ** PLEASE CALL THE OFFICE IN MID DECEMBER TO ARRANGE YOUR FOLLOW UP APPOINTMENT. **  In general, to take care of your heart failure: -Limit your fluid intake to 2 Liters (half-gallon) per day.   -Limit your salt intake to ideally 2-3 grams (2000-3000 mg) per day. -Weigh yourself daily and record, and bring that "weight diary" to your next appointment.  (Weight gain of 2-3 pounds in 1 day typically means fluid weight.) -The medications for your heart are to help your heart and help you live longer.   -Please contact us before stopping any of your heart medications.  Call the clinic at [phone] with questions or to reschedule future appointments.

## 2023-01-25 ENCOUNTER — Encounter: Payer: Self-pay | Admitting: Orthopaedic Surgery

## 2023-01-25 ENCOUNTER — Ambulatory Visit: Payer: No Typology Code available for payment source | Admitting: Orthopaedic Surgery

## 2023-01-25 DIAGNOSIS — M25511 Pain in right shoulder: Secondary | ICD-10-CM

## 2023-01-25 DIAGNOSIS — G8929 Other chronic pain: Secondary | ICD-10-CM

## 2023-01-25 NOTE — Progress Notes (Signed)
The patient is following up 6 weeks after we placed a steroid injection in his right shoulder subacromial outlet.  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  I did see recently a month ago he was admitted for acute onset congestive heart failure.  He has been seen now as an outpatient for that and is working on his own health.  I was able to read the notes from his hospitalization and his recent primary care visit.  We talked about that a little bit.  He does state his shoulder is feeling better but he still does have some pain in the shoulder.  His right shoulder does move smoothly and fluidly and his pain is only mild.  He has excellent strength in his rotator cuff.  I do think he may benefit from 1 more steroid injection but we will wait for 6 weeks till it has been 3 months between injections.  He does not really need physical therapy for the shoulder since his mobility and strength are good.  He agrees to the treatment plan.  I did pare down the callus underneath his first MTP joint for him which I do not mind doing.  At his next visit no x-rays are needed and we will probably place a steroid injection in his right shoulder again at that visit.

## 2023-02-14 DIAGNOSIS — I420 Dilated cardiomyopathy: Secondary | ICD-10-CM | POA: Diagnosis not present

## 2023-02-18 ENCOUNTER — Encounter: Payer: Self-pay | Admitting: Internal Medicine

## 2023-02-18 DIAGNOSIS — Z85820 Personal history of malignant melanoma of skin: Secondary | ICD-10-CM

## 2023-02-19 NOTE — Telephone Encounter (Signed)
 COPIED FROM CRM Reason for CRM: pt called back to check the status of his referral request to see a dermatologist. I saw where both crm's were marked as completed but I did not see it documented on pt's chart or that a referral to a dermatologist was sent. Patient stated this always happens at this clinic where no one responds to me or it takes forever to hear back. I tried to de-escalate the situation by apologizing for the delay and informing the patient that sometimes it is difficult to get the providers attention as quick as we want due to them actively being in patient care or with other patients in the clinic. I told him I would send a high priority message back. He responded that he will be making a complaint and then disconnected the call before I could respond.

## 2023-02-21 DIAGNOSIS — C4491 Basal cell carcinoma of skin, unspecified: Secondary | ICD-10-CM

## 2023-02-21 HISTORY — DX: Basal cell carcinoma of skin, unspecified: C44.91

## 2023-03-07 ENCOUNTER — Ambulatory Visit: Payer: No Typology Code available for payment source | Admitting: Dermatology

## 2023-03-08 ENCOUNTER — Encounter (HOSPITAL_COMMUNITY): Payer: Self-pay | Admitting: Cardiology

## 2023-03-08 ENCOUNTER — Ambulatory Visit (HOSPITAL_COMMUNITY)
Admission: RE | Admit: 2023-03-08 | Discharge: 2023-03-08 | Disposition: A | Discharge status: Home / Self Care | Payer: No Typology Code available for payment source | Source: Ambulatory Visit | Attending: Cardiology | Admitting: Cardiology

## 2023-03-08 ENCOUNTER — Other Ambulatory Visit (HOSPITAL_COMMUNITY): Payer: Self-pay

## 2023-03-08 VITALS — Wt 216.6 lb

## 2023-03-08 DIAGNOSIS — Z7982 Long term (current) use of aspirin: Secondary | ICD-10-CM | POA: Insufficient documentation

## 2023-03-08 DIAGNOSIS — Z79899 Other long term (current) drug therapy: Secondary | ICD-10-CM | POA: Diagnosis not present

## 2023-03-08 DIAGNOSIS — I428 Other cardiomyopathies: Secondary | ICD-10-CM | POA: Diagnosis not present

## 2023-03-08 DIAGNOSIS — I350 Nonrheumatic aortic (valve) stenosis: Secondary | ICD-10-CM | POA: Insufficient documentation

## 2023-03-08 DIAGNOSIS — I130 Hypertensive heart and chronic kidney disease with heart failure and stage 1 through stage 4 chronic kidney disease, or unspecified chronic kidney disease: Secondary | ICD-10-CM | POA: Insufficient documentation

## 2023-03-08 DIAGNOSIS — F1410 Cocaine abuse, uncomplicated: Secondary | ICD-10-CM | POA: Diagnosis not present

## 2023-03-08 DIAGNOSIS — I2510 Atherosclerotic heart disease of native coronary artery without angina pectoris: Secondary | ICD-10-CM | POA: Diagnosis not present

## 2023-03-08 DIAGNOSIS — I5042 Chronic combined systolic (congestive) and diastolic (congestive) heart failure: Secondary | ICD-10-CM

## 2023-03-08 DIAGNOSIS — Z7984 Long term (current) use of oral hypoglycemic drugs: Secondary | ICD-10-CM | POA: Insufficient documentation

## 2023-03-08 DIAGNOSIS — N183 Chronic kidney disease, stage 3 unspecified: Secondary | ICD-10-CM | POA: Insufficient documentation

## 2023-03-08 LAB — BASIC METABOLIC PANEL
Anion gap: 5 (ref 5–15)
BUN: 12 mg/dL (ref 8–23)
CO2: 27 mmol/L (ref 22–32)
Calcium: 9.9 mg/dL (ref 8.9–10.3)
Chloride: 106 mmol/L (ref 98–111)
Creatinine, Ser: 1.7 mg/dL — ABNORMAL HIGH (ref 0.61–1.24)
GFR, Estimated: 43 mL/min — ABNORMAL LOW (ref >60)
Glucose, Bld: 111 mg/dL — ABNORMAL HIGH (ref 70–99)
Potassium: 5 mmol/L (ref 3.5–5.1)
Sodium: 138 mmol/L (ref 135–145)

## 2023-03-08 LAB — BRAIN NATRIURETIC PEPTIDE: B Natriuretic Peptide: 52.4 pg/mL (ref 0.0–100.0)

## 2023-03-08 MED ORDER — CARVEDILOL 12.5 MG PO TABS
18.7500 mg | ORAL_TABLET | Freq: Two times a day (BID) | ORAL | 3 refills | Status: DC
  Filled 2023-03-08: qty 180, 60d supply, fill #0
  Filled 2023-04-30: qty 180, 60d supply, fill #1

## 2023-03-08 NOTE — Patient Instructions (Signed)
INCREASE Carvedilol to 18.75 mg (1 1/2 tab) Twice daily  Labs done today, your results will be available in MyChart, we will contact you for abnormal readings.  Your physician has requested that you have an echocardiogram. Echocardiography is a painless test that uses sound waves to create images of your heart. It provides your doctor with information about the size and shape of your heart and how well your heart's chambers and valves are working. This procedure takes approximately one hour. There are no restrictions for this procedure. Please do NOT wear cologne, perfume, aftershave, or lotions (deodorant is allowed). Please arrive 15 minutes prior to your appointment time.  Please note: We ask at that you not bring children with you during ultrasound (echo/ vascular) testing. Due to room size and safety concerns, children are not allowed in the ultrasound rooms during exams. Our front office staff cannot provide observation of children in our lobby area while testing is being conducted. An adult accompanying a patient to their appointment will only be allowed in the ultrasound room at the discretion of the ultrasound technician under special circumstances. We apologize for any inconvenience.  Your physician recommends that you schedule a follow-up appointment in: 1 month.  If you have any questions or concerns before your next appointment please send [place] a message through [place] or call our office at [phone].    TO LEAVE A MESSAGE FOR THE NURSE SELECT OPTION 2, PLEASE LEAVE A MESSAGE INCLUDING: YOUR NAME DATE OF BIRTH CALL BACK NUMBER REASON FOR CALL**this is important as we prioritize the call backs  YOU WILL RECEIVE A CALL BACK THE SAME DAY AS LONG AS YOU CALL BEFORE 4:00 PM  At the [facility], you and your health needs are our priority. As part of our continuing mission to provide you with exceptional heart care, we have created designated Provider Care Teams. These  Care Teams include your primary Cardiologist (physician) and Advanced Practice Providers (APPs- Physician Assistants and Nurse Practitioners) who all work together to provide you with the care you need, when you need it.   You may see any of the following providers on your designated Care Team at your next follow up: Dr Arvilla Meres Dr Marca Ancona Dr. Dorthula Nettles Dr. Clearnce Hasten Amy Filbert Schilder, NP Robbie Lis, [place] [facility] [place], [place] Brynda Peon, NP [place] Lee, NP Karle Plumber, PharmD   Please be sure to bring in all your medications bottles to every appointment.    Thank you for choosing [facility]

## 2023-03-08 NOTE — Progress Notes (Signed)
PCP: Myrlene Broker, MD Cardiology: Dr. Shirlee Latch  68 y.o. with past medical history of HTN, GERD, ETOH use, cocaine use and osteoarthritis.  Patient reports long history of alcohol and cocaine use (last use 10 days ago), and prior anabolic steroid use in 20's. Seen 11/28/22 by PCP for fatigue and new cough and was treated for pneumonia with course of Doxycyline.  Pt presented to the ED in 11/24 after syncopal event while sitting on the couch at home. During event he did hit his head on the floor. The day before had tolerated working in the yard for about 3 hours. CT head no acute findings, small vessel ischemic changes. CT PE negative, but notable for moderate coronary artery calcification and 4.3 cm ascending thoracic aortic aneurysm. Lab on admission K 3.4, Cr 1.52, AST/ALT 51/51, Trop 108>97, BNP 75.7. CXR normal. Abd US showed 3.3 cm abd aortic aneurysm. Echo showed EF 20-25%, RV mildly reduced, mild AS (cannot rule out bicuspid valve), mild AI.  Patient had gentle diuresis in the hospital and was started on GDMT for CHF. Coronary angiography showed 75% mid to distal LAD stenosis that did not explain the extent of his cardiomyopathy.  Cardiac MRI showed EF 23%, mid-wall LGE in the basal septum, mid inferoseptum, mid inferolateral wall and transmural LGE in the apical lateral wall; RV EF 42%, ECV 25%.  BP was controlled. No arrhythmias noted in the hospital, and he was sent home with Lifevest.  He sent the Lifevest back after about 2 wks.   Patient returns for followup of CHF. Weight is up, attributes to eating over the holidays. He has not used cocaine since October.  He has been going to the gym 3-4 times/week.  He tires out more easily than in the past but not getting short of breath with exertion.  Has to rest after about 15 minutes when doing heavy yardwork. No chest pain.  No lightheadedness or syncope. Bothered by right shoulder arthritis. He snores and has daytime sleepiness.   ECG (personally  reviewed): NSR, borderline LVH, TWI V6  Labs (11/24): Lp(a) 13, K 4, creatinine 1.44, LDL 102 Labs (12/24): K 4.6, creatinine 1.51  PMH: 1. HTN 2. GERD 3. Cocaine abuse 4. Ascending aortic aneurysm: 4.3 cm on 11/24 CTA chest.  5. CAD: Coronary angiography (11/24) with 75% mid-distal LAD stenosis. Medically managed.  6. Chronic systolic CHF: Primarily nonischemic cardiomyopathy, ?related to HTN or cocaine.  - Echo (11/24): EF 20-25%, moderate LV dilation, mild RV dysfunction, mild-moderate AS mean gradient 12 mmHg.  - Cardiac MRI (11/24): LV EF 23%, mild LVH, mid-wall LGE in the basal septum, mid inferoseptum, mid inferolateral wall and transmural LGE in the apical lateral wall; RV EF 42%, ECV 25%.  7. Aortic stenosis: Mild-moderate on 11/24 echo, possible bicuspid.  8. Syncope: 11/24  SH: Retired, married.  H/o ETOH and cocaine abuse.   Family History  Problem Relation Age of Onset   Dementia Mother    Lung cancer Father    Other Sister        Lyme's disease   Diabetes Maternal Grandmother    Colon cancer Neg Hx    Colon polyps Neg Hx    Rectal cancer Neg Hx    Stomach cancer Neg Hx    ROS: All systems reviewed and negative except as per HPI.   General: NAD Neck: No JVD, no thyromegaly or thyroid nodule.  Lungs: Clear to auscultation bilaterally with normal respiratory effort. CV: Nondisplaced PMI.  Heart regular  S1/S2, no S3/S4, 2/6 early SEM RUSB.  No peripheral edema.  No carotid bruit.  Normal pedal pulses.  Abdomen: Soft, nontender, no hepatosplenomegaly, no distention.  Skin: Intact without lesions or rashes.  Neurologic: Alert and oriented x 3.  Psych: Normal affect. Extremities: No clubbing or cyanosis.  HEENT: Normal.   Assessment/Plan: 1. Chronic systolic CHF: Primarily nonischemic cardiomyopathy. Echo in 11/24 with  EF 20-25%, moderate LV dilation, mild-moderate AS with mean gradient 14 mmHg and AVA 1.25 cm^2, mild AI, IVC normal.  He was admitted in 11/24 due  to syncopal episode.  Patient has history of untreated HTN as well as cocaine use. No strong FH of cardiomyopathy. It is possible that the cardiomyopathy is due to cocaine/HTN.  However, he had significant LGE on cardiac MRI suggesting possibility of myocarditis or some form of infiltrative disease.  Cardiac MRI in 11/24 showed LV EF 23%, mild LVH, mid-wall LGE in the basal septum, mid inferoseptum, mid inferolateral wall and transmural LGE in the apical lateral wall; RV EF 42%, ECV 25%.  LHC/RHC in 11/24 showed normal filling pressures, low CI 1.9.  He had a 75% mid-distal LAD stenosis, but I do not think that this explains the extent of his cardiomyopathy.   NYHA class II, not volume overloaded on exam.    - Continue Entresto 97/103 bid.  - Continue Farxiga 10 mg daily.  - Increase Coreg to 18.75 mg bid.  - Continue spironolactone 25 mg daily. BMET/BNP today.  - Imperative to quit cocaine => he says that he has quit since 10/24.  - Repeat echo in 2/24 to assess for ICD vs recovery.  Narrow QRS so not CRT candidate.  2.  Syncope: Occurred on couch while watching TV in 11/24, no prodrome.  I worry that this was an arrhythmic event. No events on telemetry in hospital in 11/24.  No further syncope/presyncope. He wore a Lifevest for a couple of weeks after his admission but sent it back. - Repeat echo in 2/24 to assess for need for ICD.  3. CKD stage 3: BMET today.  4. CAD: Cath in 11/24 showed 75% mid to distal LAD stenosis, otherwise nonobstructive disease.  I do not think he had ACS.  I do not think that his degree of CAD explains markedly low EF (?due to cocaine abuse vs HTN vs myocarditis).  He has no anginal-type chest pain.  Manage medically for now.  - ASA 81 daily - Continue Crestor, check lipids next appt.  5. Aortic stenosis: Mild to moderate on echo in 11/24.  Will need to follow going forward.  .  6. Ascending aortic dilation: 4.3 cm on CT in 11/24. Follow with time and control BP.  7. HTN:  BP controlled on GDMT.  8. Cocaine use: Daily x yrs, he says he has quit since hospitalization in 10/24.  9. Suspect OSA:  I will have him do a home sleep study.   Followup in 1 month after echo.   Marca Ancona 03/08/2023

## 2023-03-09 ENCOUNTER — Other Ambulatory Visit (HOSPITAL_COMMUNITY): Payer: Self-pay

## 2023-03-09 DIAGNOSIS — I5042 Chronic combined systolic (congestive) and diastolic (congestive) heart failure: Secondary | ICD-10-CM

## 2023-03-13 ENCOUNTER — Ambulatory Visit: Payer: No Typology Code available for payment source | Admitting: Dermatology

## 2023-03-13 ENCOUNTER — Encounter: Payer: Self-pay | Admitting: Dermatology

## 2023-03-13 VITALS — BP 124/69 | HR 102

## 2023-03-13 DIAGNOSIS — W908XXA Exposure to other nonionizing radiation, initial encounter: Secondary | ICD-10-CM | POA: Diagnosis not present

## 2023-03-13 DIAGNOSIS — L578 Other skin changes due to chronic exposure to nonionizing radiation: Secondary | ICD-10-CM

## 2023-03-13 DIAGNOSIS — L814 Other melanin hyperpigmentation: Secondary | ICD-10-CM | POA: Diagnosis not present

## 2023-03-13 DIAGNOSIS — Z85828 Personal history of other malignant neoplasm of skin: Secondary | ICD-10-CM

## 2023-03-13 DIAGNOSIS — L821 Other seborrheic keratosis: Secondary | ICD-10-CM | POA: Diagnosis not present

## 2023-03-13 DIAGNOSIS — D1801 Hemangioma of skin and subcutaneous tissue: Secondary | ICD-10-CM

## 2023-03-13 DIAGNOSIS — Q859 Phakomatosis, unspecified: Secondary | ICD-10-CM

## 2023-03-13 DIAGNOSIS — L570 Actinic keratosis: Secondary | ICD-10-CM

## 2023-03-13 DIAGNOSIS — C44511 Basal cell carcinoma of skin of breast: Secondary | ICD-10-CM

## 2023-03-13 DIAGNOSIS — D485 Neoplasm of uncertain behavior of skin: Secondary | ICD-10-CM

## 2023-03-13 DIAGNOSIS — D3617 Benign neoplasm of peripheral nerves and autonomic nervous system of trunk, unspecified: Secondary | ICD-10-CM | POA: Diagnosis not present

## 2023-03-13 DIAGNOSIS — D229 Melanocytic nevi, unspecified: Secondary | ICD-10-CM

## 2023-03-13 DIAGNOSIS — Z1283 Encounter for screening for malignant neoplasm of skin: Secondary | ICD-10-CM | POA: Diagnosis not present

## 2023-03-13 DIAGNOSIS — D492 Neoplasm of unspecified behavior of bone, soft tissue, and skin: Secondary | ICD-10-CM | POA: Diagnosis not present

## 2023-03-13 NOTE — Patient Instructions (Signed)
Important Information  Due to recent changes in healthcare laws, you may see results of your pathology and/or laboratory studies on MyChart before the doctors have had a chance to review them. We understand that in some cases there may be results that are confusing or concerning to you. Please understand that not all results are received at the same time and often the doctors may need to interpret multiple results in order to provide you with the best plan of care or course of treatment. Therefore, we ask that you please give [place] 2 business days to thoroughly review all your results before contacting the office for clarification. Should we see a critical lab result, you will be contacted sooner.   If You Need Anything After Your Visit  If you have any questions or concerns for your doctor, please call our main line at [phone] If no one answers, please leave a voicemail as directed and we will return your call as soon as possible. Messages left after 4 pm will be answered the following business day.   You may also send [place] a message via MyChart. We typically respond to MyChart messages within 1-2 business days.  For prescription refills, please ask your pharmacy to contact our office. Our fax number is [phone].  If you have an urgent issue when the clinic is closed that cannot wait until the next business day, you can page your doctor at the number below.    Please note that while we do our best to be available for urgent issues outside of office hours, we are not available 24/7.   If you have an urgent issue and are unable to reach [place], you may choose to seek medical care at your doctor's office, retail clinic, urgent care center, or emergency room.  If you have a medical emergency, please immediately call 911 or go to the emergency department. In the event of inclement weather, please call our main line at [phone] for an update on the status of any delays or  closures.  Dermatology Medication Tips: Please keep the boxes that topical medications come in in order to help keep track of the instructions about where and how to use these. Pharmacies typically print the medication instructions only on the boxes and not directly on the medication tubes.   If your medication is too expensive, please contact our office at [phone] or send [place] a message through MyChart.   We are unable to tell what your co-pay for medications will be in advance as this is different depending on your insurance coverage. However, we may be able to find a substitute medication at lower cost or fill out paperwork to get insurance to cover a needed medication.   If a prior authorization is required to get your medication covered by your insurance company, please allow [place] 1-2 business days to complete this process.  Drug prices often vary depending on where the prescription is filled and some pharmacies may offer cheaper prices.  The website www.goodrx.com contains coupons for medications through different pharmacies. The prices here do not account for what the cost may be with help from insurance (it may be cheaper with your insurance), but the website can give you the price if you did not use any insurance.  - You can print the associated coupon and take it with your prescription to the pharmacy.  - You may also stop by our office during regular business hours and pick up a GoodRx coupon card.  - If  you need your prescription sent electronically to a different pharmacy, notify our office through [facility] or by phone at [phone]     Patient Handout: Wound Care for Skin Biopsy Site  Taking Care of Your Skin Biopsy Site  Proper care of the biopsy site is essential for promoting healing and minimizing scarring. This handout provides instructions on how to care for your biopsy site to ensure optimal recovery.  1. Cleaning the Wound:  Clean the biopsy site daily  with gentle soap and water. Gently pat the area dry with a clean, soft towel. Avoid harsh scrubbing or rubbing the area, as this can irritate the skin and delay healing.  2. Applying Aquaphor and Bandage:  After cleaning the wound, apply a thin layer of Aquaphor ointment to the biopsy site. Cover the area with a sterile bandage to protect it from dirt, bacteria, and friction. Change the bandage daily or as needed if it becomes soiled or wet.  3. Continued Care for One Week:  Repeat the cleaning, Aquaphor application, and bandaging process daily for one week following the biopsy procedure. Keeping the wound clean and moist during this initial healing period will help prevent infection and promote optimal healing.  4. Massaging Aquaphor into the Area:  ---After one week, discontinue the use of bandages but continue to apply Aquaphor to the biopsy site. ----Gently massage the Aquaphor into the area using circular motions. ---Massaging the skin helps to promote circulation and prevent the formation of scar tissue.   Additional Tips:  Avoid exposing the biopsy site to direct sunlight during the healing process, as this can cause hyperpigmentation or worsen scarring. If you experience any signs of infection, such as increased redness, swelling, warmth, or drainage from the wound, contact your healthcare provider immediately. Follow any additional instructions provided by your healthcare provider for caring for the biopsy site and managing any discomfort. Conclusion:  Taking proper care of your skin biopsy site is crucial for ensuring optimal healing and minimizing scarring. By following these instructions for cleaning, applying Aquaphor, and massaging the area, you can promote a smooth and successful recovery. If you have any questions or concerns about caring for your biopsy site, don't hesitate to contact your healthcare provider for guidance.  For areas treated with Liquid Nitrogen:  Keep  clean with soap and water.  Apply Vaseline or Aquaphor twice daily.

## 2023-03-13 NOTE — Progress Notes (Addendum)
New Patient Visit   Subjective  Travis Shea is a 68 y.o. male who presents for the following: Skin Cancer Screening and Full Body Skin Exam.  Hx of skin cancer on his right chest, treated at an outside office.  The patient presents for Total-Body Skin Exam (TBSE) for skin cancer screening and mole check. The patient has spots, moles and lesions to be evaluated, some may be new or changing and the patient may have concern these could be cancer.   The following portions of the chart were reviewed this encounter and updated as appropriate: medications, allergies, medical history  Review of Systems:  No other skin or systemic complaints except as noted in HPI or Assessment and Plan.  Objective  Well appearing patient in no apparent distress; mood and affect are within normal limits.  A full examination was performed including scalp, head, eyes, ears, nose, lips, neck, chest, axillae, abdomen, back, buttocks, bilateral upper extremities, bilateral lower extremities, hands, feet, fingers, toes, fingernails, and toenails. All findings within normal limits unless otherwise noted below.   Relevant physical exam findings are noted in the Assessment and Plan.  Right Breast 1.2 cm pink plaque with telangiectasias  Mid Back 1.1 cm pedunculated skin colored plaque  upper Mid Back 0.9 mm pedunculated skin colored papule  Left Breast 1.0 cm pink papule with telangiectasias  Right Tip of Nose 0.7 cm pink papule with telangiectasias   Assessment & Plan   SKIN CANCER SCREENING PERFORMED TODAY.  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  - Staying in the shade or wearing long sleeves, sun glasses (UVA+UVB protection) and wide brim hats  (4-inch brim around the entire circumference of the hat) are also recommended for sun protection.  - Call for new or changing lesions.  LENTIGINES, SEBORRHEIC KERATOSES, HEMANGIOMAS - Benign normal skin lesions - Benign-appearing - Call for any changes  MELANOCYTIC NEVI - Tan-brown and/or pink-flesh-colored symmetric macules and papules - Benign appearing on exam today - Observation - Call clinic for new or changing moles - Recommend daily use of broad spectrum spf 30+ sunscreen to sun-exposed areas.   Hx of NMSC - Evaluating possible recurrence on right chest, based on patient's HPI - Recommend yearly FBSEs NEOPLASM OF UNCERTAIN BEHAVIOR OF SKIN (5) Right Breast Skin / nail biopsy Type of biopsy: tangential   Informed consent: discussed and consent obtained   Timeout: patient name, date of birth, surgical site, and procedure verified   Anesthesia: the lesion was anesthetized in a standard fashion   Anesthetic:  1% lidocaine w/ epinephrine 1-100,000 buffered w/ 8.4% NaHCO3 Instrument used: DermaBlade   Hemostasis achieved with: aluminum chloride   Outcome: patient tolerated procedure well   Post-procedure details: sterile dressing applied and wound care instructions given   Dressing type: bandage and petrolatum    Skin / nail biopsy Type of biopsy: tangential   Informed consent: discussed and consent obtained   Timeout: patient name, date of birth, surgical site, and procedure verified   Procedure prep:  Patient was prepped and draped in usual sterile fashion Prep type:  Isopropyl alcohol Anesthesia: the lesion was anesthetized in a standard fashion   Anesthetic:  1% lidocaine w/ epinephrine 1-100,000 buffered w/ 8.4% NaHCO3 Instrument used: DermaBlade   Hemostasis achieved with: aluminum chloride  Outcome: patient tolerated procedure well   Post-procedure details: sterile dressing applied and wound care instructions given   Dressing type: petrolatum and bandage    Specimen 1 - Surgical pathology Differential Diagnosis: r/o NMSC  Check Margins: No Mid Back Skin / nail biopsy Type of biopsy: tangential   Informed consent: discussed and consent obtained   Timeout: patient name, date of birth, surgical site, and procedure verified   Procedure prep:  Patient was prepped and draped in usual sterile fashion Prep type:  Isopropyl alcohol Anesthesia: the lesion was anesthetized in a standard fashion   Anesthetic:  1% lidocaine w/ epinephrine 1-100,000 buffered w/ 8.4% NaHCO3 Instrument used: DermaBlade   Hemostasis achieved with: aluminum chloride   Outcome: patient tolerated procedure well   Post-procedure details: sterile dressing applied and wound care instructions given   Dressing type: petrolatum and pressure dressing   Specimen 2 - Surgical pathology Differential Diagnosis: r/o BN  Check Margins: No upper Mid Back Skin / nail biopsy Type of biopsy: tangential   Informed consent: discussed and consent obtained   Timeout: patient name, date of birth, surgical site, and procedure verified   Procedure prep:  Patient was prepped and draped in usual sterile fashion Prep type:  Isopropyl alcohol Anesthesia: the lesion was anesthetized in a standard fashion   Anesthetic:  1% lidocaine w/ epinephrine 1-100,000 buffered w/ 8.4% NaHCO3 Instrument used: DermaBlade   Hemostasis achieved with: aluminum chloride   Outcome: patient tolerated procedure well   Post-procedure details: sterile dressing applied and wound care instructions given   Dressing type: petrolatum and bandage   Specimen 3 - Surgical pathology Differential Diagnosis: r/o bn  Check Margins: No Left Breast Skin / nail biopsy Type of biopsy: tangential   Informed consent: discussed and consent obtained   Timeout: patient name, date of birth, surgical site, and procedure verified   Procedure prep:  Patient was prepped and draped in usual sterile fashion Prep type:  Isopropyl  alcohol Anesthesia: the lesion was anesthetized in a standard fashion   Anesthetic:  1% lidocaine w/ epinephrine 1-100,000 buffered w/ 8.4% NaHCO3 Instrument used: DermaBlade   Hemostasis achieved with: aluminum chloride   Outcome: patient tolerated procedure well   Post-procedure details: sterile dressing applied and wound care instructions given   Dressing type: petrolatum and bandage   Specimen 5 - Surgical pathology Differential Diagnosis: r/o MNSC  Check Margins: No Right Tip of Nose Skin / nail biopsy Type of biopsy: tangential   Informed consent: discussed and consent obtained   Timeout: patient name, date of birth, surgical site, and procedure verified   Procedure prep:  Patient was prepped and draped in usual sterile fashion Prep type:  Isopropyl alcohol Anesthesia: the lesion was anesthetized in a standard fashion   Anesthetic:  1% lidocaine w/ epinephrine 1-100,000 buffered w/ 8.4% NaHCO3 Instrument used: DermaBlade   Hemostasis achieved with: aluminum chloride   Outcome: patient tolerated procedure well   Post-procedure details: sterile dressing applied and wound care instructions given   Dressing type: petrolatum and bandage   Specimen 4 - Surgical pathology Differential Diagnosis: r/o NMSC  Check Margins: No AK (ACTINIC KERATOSIS) Posterior Mid Neck Destruction of lesion - Posterior Mid Neck Complexity: extensive   Destruction method: cryotherapy   Informed consent: discussed and consent obtained   Lesion destroyed using liquid nitrogen: Yes   Region frozen until ice ball extended beyond lesion: Yes   Cryotherapy cycles:  2 Outcome: patient tolerated procedure well with no complications  Post-procedure details: wound care instructions given   LENTIGINES   SEBORRHEIC KERATOSIS   CHERRY ANGIOMA   MULTIPLE BENIGN NEVI   HISTORY OF NONMELANOMA SKIN CANCER    Return in about 6 months (around 09/10/2023) for tbsc.   Documentation: I have reviewed  the above documentation for accuracy and completeness, and I agree with the above.  Gwenith Daily, MD

## 2023-03-14 LAB — SURGICAL PATHOLOGY

## 2023-03-16 ENCOUNTER — Other Ambulatory Visit (HOSPITAL_COMMUNITY): Payer: Self-pay

## 2023-03-19 ENCOUNTER — Other Ambulatory Visit (HOSPITAL_COMMUNITY): Payer: Self-pay

## 2023-03-20 ENCOUNTER — Telehealth: Payer: Self-pay

## 2023-03-20 NOTE — Telephone Encounter (Signed)
Advised patient of pathology results and will schedule him for 2 surgeries with Dr Caralyn Guile.

## 2023-03-20 NOTE — Telephone Encounter (Signed)
-----   Message from [facility] PACI sent at 03/20/2023 11:33 AM EST ----- 1. BCC- right breast- WLE with me 2. Neurofibroma- mid back- benign 3. 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 BCC- left breast- WLE with me

## 2023-03-22 ENCOUNTER — Ambulatory Visit: Payer: No Typology Code available for payment source | Admitting: Orthopaedic Surgery

## 2023-03-22 ENCOUNTER — Encounter: Payer: Self-pay | Admitting: Orthopaedic Surgery

## 2023-03-22 DIAGNOSIS — G8929 Other chronic pain: Secondary | ICD-10-CM

## 2023-03-22 DIAGNOSIS — M7541 Impingement syndrome of right shoulder: Secondary | ICD-10-CM

## 2023-03-22 DIAGNOSIS — M25511 Pain in right shoulder: Secondary | ICD-10-CM | POA: Diagnosis not present

## 2023-03-22 MED ORDER — METHYLPREDNISOLONE ACETATE 40 MG/ML IJ SUSP
40.0000 mg | INTRAMUSCULAR | Status: AC | PRN
  Administered 2023-03-22: 40 mg via INTRA_ARTICULAR

## 2023-03-22 MED ORDER — LIDOCAINE HCL 1 % IJ SOLN
3.0000 mL | INTRAMUSCULAR | Status: AC | PRN
  Administered 2023-03-22: 3 mL

## 2023-03-22 NOTE — Progress Notes (Signed)
 The patient is well-known to us .  He has been dealing with chronic right shoulder pain and impingement.  He comes in today requesting a steroid injection in his right shoulder.  He has had no acute change in medical status but he does deal with chronic comorbidities a lot related to his heart.  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  Examination of his right shoulder shows it was smoothly and fluidly but does show signs of impingement.  The rotator cuff itself feels strong.  Per his request I did place a steroid injection in his right shoulder subacromial outlet.  He knows to be careful with his heavy lifting and to try to back off on his exercise routine with light lifting including overhead presses and bench pressing.  He knows to wait a minimum of 3 months between any type of steroid injection.    Procedure Note  Patient: Travis Shea             Date of Birth: 08-Feb-1956           MRN: [id number]             Visit Date: 03/22/2023  Procedures: Visit Diagnoses:  1. Chronic right shoulder pain   2. Impingement syndrome of right shoulder     Large Joint Inj: R subacromial bursa on 03/22/2023 3:26 PM Indications: pain and diagnostic evaluation Details: 22 G 1.5 in needle  Arthrogram: No  Medications: 3 mL lidocaine  1 %; 40 mg methylPREDNISolone  acetate 40 MG/ML Outcome: tolerated well, no immediate complications Procedure, treatment alternatives, risks and benefits explained, specific risks discussed. Consent was given by the patient. Immediately prior to procedure a time out was called to verify the correct patient, procedure, equipment, support staff and site/side marked as required. Patient was prepped and draped in the usual sterile fashion.

## 2023-04-05 ENCOUNTER — Emergency Department (HOSPITAL_BASED_OUTPATIENT_CLINIC_OR_DEPARTMENT_OTHER): Payer: No Typology Code available for payment source

## 2023-04-05 ENCOUNTER — Other Ambulatory Visit: Payer: Self-pay

## 2023-04-05 ENCOUNTER — Emergency Department (HOSPITAL_BASED_OUTPATIENT_CLINIC_OR_DEPARTMENT_OTHER)
Admission: EM | Admit: 2023-04-05 | Discharge: 2023-04-05 | Disposition: A | Discharge status: Home / Self Care | Payer: No Typology Code available for payment source | Attending: Emergency Medicine | Admitting: Emergency Medicine

## 2023-04-05 ENCOUNTER — Encounter (HOSPITAL_BASED_OUTPATIENT_CLINIC_OR_DEPARTMENT_OTHER): Payer: Self-pay

## 2023-04-05 ENCOUNTER — Other Ambulatory Visit (HOSPITAL_COMMUNITY): Payer: Self-pay

## 2023-04-05 DIAGNOSIS — R140 Abdominal distension (gaseous): Secondary | ICD-10-CM | POA: Insufficient documentation

## 2023-04-05 DIAGNOSIS — R1032 Left lower quadrant pain: Secondary | ICD-10-CM | POA: Diagnosis not present

## 2023-04-05 DIAGNOSIS — I509 Heart failure, unspecified: Secondary | ICD-10-CM | POA: Diagnosis not present

## 2023-04-05 DIAGNOSIS — K5730 Diverticulosis of large intestine without perforation or abscess without bleeding: Secondary | ICD-10-CM | POA: Diagnosis not present

## 2023-04-05 DIAGNOSIS — R197 Diarrhea, unspecified: Secondary | ICD-10-CM | POA: Insufficient documentation

## 2023-04-05 DIAGNOSIS — R109 Unspecified abdominal pain: Secondary | ICD-10-CM | POA: Diagnosis not present

## 2023-04-05 DIAGNOSIS — R112 Nausea with vomiting, unspecified: Secondary | ICD-10-CM | POA: Diagnosis not present

## 2023-04-05 DIAGNOSIS — N200 Calculus of kidney: Secondary | ICD-10-CM | POA: Diagnosis not present

## 2023-04-05 DIAGNOSIS — Z7982 Long term (current) use of aspirin: Secondary | ICD-10-CM | POA: Insufficient documentation

## 2023-04-05 HISTORY — DX: Heart failure, unspecified: I50.9

## 2023-04-05 LAB — COMPREHENSIVE METABOLIC PANEL
ALT: 56 U/L — ABNORMAL HIGH (ref 0–44)
AST: 43 U/L — ABNORMAL HIGH (ref 15–41)
Albumin: 4 g/dL (ref 3.5–5.0)
Alkaline Phosphatase: 34 U/L — ABNORMAL LOW (ref 38–126)
Anion gap: 9 (ref 5–15)
BUN: 23 mg/dL (ref 8–23)
CO2: 25 mmol/L (ref 22–32)
Calcium: 10.1 mg/dL (ref 8.9–10.3)
Chloride: 102 mmol/L (ref 98–111)
Creatinine, Ser: 1.66 mg/dL — ABNORMAL HIGH (ref 0.61–1.24)
GFR, Estimated: 45 mL/min — ABNORMAL LOW (ref >60)
Glucose, Bld: 125 mg/dL — ABNORMAL HIGH (ref 70–99)
Potassium: 4.6 mmol/L (ref 3.5–5.1)
Sodium: 136 mmol/L (ref 135–145)
Total Bilirubin: 1.7 mg/dL — ABNORMAL HIGH (ref 0.0–1.2)
Total Protein: 7.5 g/dL (ref 6.5–8.1)

## 2023-04-05 LAB — URINALYSIS, ROUTINE W REFLEX MICROSCOPIC
Bilirubin Urine: NEGATIVE
Glucose, UA: >=500 mg/dL — AB
Ketones, ur: NEGATIVE mg/dL
Leukocytes,Ua: NEGATIVE
Nitrite: NEGATIVE
Protein, ur: 30 mg/dL — AB
Specific Gravity, Urine: >=1.03 (ref 1.005–1.030)
pH: 5.5 (ref 5.0–8.0)

## 2023-04-05 LAB — CBC WITH DIFFERENTIAL/PLATELET
Abs Immature Granulocytes: 0.06 10*3/uL (ref 0.00–0.07)
Basophils Absolute: 0 10*3/uL (ref 0.0–0.1)
Basophils Relative: 0 %
Eosinophils Absolute: 0.1 10*3/uL (ref 0.0–0.5)
Eosinophils Relative: 1 %
HCT: 50.2 % (ref 39.0–52.0)
Hemoglobin: 16.5 g/dL (ref 13.0–17.0)
Immature Granulocytes: 1 %
Lymphocytes Relative: 4 %
Lymphs Abs: 0.5 10*3/uL — ABNORMAL LOW (ref 0.7–4.0)
MCH: 29.6 pg (ref 26.0–34.0)
MCHC: 32.9 g/dL (ref 30.0–36.0)
MCV: 90.1 fL (ref 80.0–100.0)
Monocytes Absolute: 0.7 10*3/uL (ref 0.1–1.0)
Monocytes Relative: 6 %
Neutro Abs: 11 10*3/uL — ABNORMAL HIGH (ref 1.7–7.7)
Neutrophils Relative %: 88 %
Platelets: 238 10*3/uL (ref 150–400)
RBC: 5.57 MIL/uL (ref 4.22–5.81)
RDW: 15.3 % (ref 11.5–15.5)
WBC: 12.3 10*3/uL — ABNORMAL HIGH (ref 4.0–10.5)
nRBC: 0 % (ref 0.0–0.2)

## 2023-04-05 LAB — URINALYSIS, MICROSCOPIC (REFLEX)

## 2023-04-05 LAB — LIPASE, BLOOD: Lipase: 27 U/L (ref 11–51)

## 2023-04-05 MED ORDER — KETOROLAC TROMETHAMINE 15 MG/ML IJ SOLN
15.0000 mg | Freq: Once | INTRAMUSCULAR | Status: AC
  Administered 2023-04-05: 15 mg via INTRAVENOUS
  Filled 2023-04-05: qty 1

## 2023-04-05 MED ORDER — IOHEXOL 300 MG/ML  SOLN
75.0000 mL | Freq: Once | INTRAMUSCULAR | Status: AC | PRN
  Administered 2023-04-05: 75 mL via INTRAVENOUS

## 2023-04-05 MED ORDER — ONDANSETRON 4 MG PO TBDP
4.0000 mg | ORAL_TABLET | Freq: Three times a day (TID) | ORAL | 0 refills | Status: DC | PRN
  Filled 2023-04-05: qty 20, 7d supply, fill #0

## 2023-04-05 MED ORDER — SODIUM CHLORIDE 0.9 % IV BOLUS
500.0000 mL | Freq: Once | INTRAVENOUS | Status: AC
  Administered 2023-04-05: 500 mL via INTRAVENOUS

## 2023-04-05 MED ORDER — DICYCLOMINE HCL 10 MG PO CAPS
10.0000 mg | ORAL_CAPSULE | Freq: Once | ORAL | Status: AC
  Administered 2023-04-05: 10 mg via ORAL
  Filled 2023-04-05: qty 1

## 2023-04-05 MED ORDER — DICYCLOMINE HCL 20 MG PO TABS
20.0000 mg | ORAL_TABLET | Freq: Two times a day (BID) | ORAL | 0 refills | Status: DC
  Filled 2023-04-05: qty 20, 10d supply, fill #0

## 2023-04-05 MED ORDER — ONDANSETRON HCL 4 MG/2ML IJ SOLN
4.0000 mg | Freq: Once | INTRAMUSCULAR | Status: AC
  Administered 2023-04-05: 4 mg via INTRAVENOUS
  Filled 2023-04-05: qty 2

## 2023-04-05 MED ORDER — PANTOPRAZOLE SODIUM 40 MG PO TBEC
80.0000 mg | DELAYED_RELEASE_TABLET | Freq: Every day | ORAL | Status: DC
  Administered 2023-04-05: 80 mg via ORAL
  Filled 2023-04-05: qty 2

## 2023-04-05 NOTE — ED Notes (Signed)
Pt given water for PO challenge per EDP order

## 2023-04-05 NOTE — ED Notes (Signed)
 Pt able to tolerate fluids, EDP notified

## 2023-04-05 NOTE — ED Triage Notes (Signed)
 In for eval of nausea, vomiting, diarrhea, and slight abd pain. Onset last night at approx 2000.

## 2023-04-05 NOTE — ED Provider Notes (Signed)
 [facility] Provider Note   CSN: [id number] Arrival date & time: 04/05/23  1017     History Chief Complaint  Patient presents with   Emesis    Travis Shea is a 68 y.o. male patient with history of congestive heart failure last echocardiogram revealed an EF of 20-25 percent from November who presents to the emergency department with nausea, vomiting, diarrhea, and left lower quadrant abdominal pain that started late last night.  Chart review does reveal that the patient did have a colonoscopy within the last year and it did reveal that he did have numerous diverticula.  He denies any fever or chills.  He states that he has been having pretty much intractable vomiting with the diarrhea.  Also complaining of right ear pain which occurred right after vomiting.  Denies any urinary symptoms.   Emesis      Home Medications Prior to Admission medications   Medication Sig Start Date End Date Taking? Authorizing Provider  dicyclomine (BENTYL) 20 MG tablet Take 1 tablet (20 mg total) by mouth 2 (two) times daily. 04/05/23  Yes Meredeth Ide, Jakala Herford M, PA-C  ondansetron (ZOFRAN-ODT) 4 MG disintegrating tablet Take 1 tablet (4 mg total) by mouth every 8 (eight) hours as needed for nausea or vomiting. 04/05/23  Yes Honor Loh M, PA-C  acetaminophen (TYLENOL) 325 MG tablet Take 650 mg by mouth daily as needed for mild pain (pain score 1-3) (Shoulder arthritis).    [provider]  aspirin 81 MG chewable tablet Chew 1 tablet (81 mg total) by mouth daily. 12/22/22   Almon Hercules, MD  carvedilol (COREG) 12.5 MG tablet Take 1.5 tablets (18.75 mg total) by mouth 2 (two) times daily with a meal. 03/08/23   Laurey Morale, MD  dapagliflozin propanediol (FARXIGA) 10 MG TABS tablet Take 1 tablet (10 mg total) by mouth daily. 12/22/22 06/20/23  Almon Hercules, MD  Multiple Vitamins-Minerals (MULTIVITAMIN WITH MINERALS) tablet Take 1 tablet by mouth daily.  Mens Centrum    [provider]  omeprazole (PRILOSEC) 40 MG capsule Take 1 capsule (40 mg total) by mouth daily. 11/28/22   Myrlene Broker, MD  rosuvastatin (CRESTOR) 20 MG tablet Take 1 tablet (20 mg total) by mouth daily. 01/18/23 01/18/24  Laurey Morale, MD  sacubitril-valsartan (ENTRESTO) 97-103 MG Take 1 tablet by mouth 2 (two) times daily. 01/18/23   Laurey Morale, MD  spironolactone (ALDACTONE) 25 MG tablet Take 1 tablet (25 mg total) by mouth daily. 01/18/23   Laurey Morale, MD  thiamine (VITAMIN B1) 100 MG tablet Take 1 tablet (100 mg total) by mouth daily. 12/22/22   Almon Hercules, MD      Allergies    Morphine and codeine, Other, and Buprenorphine hcl    Review of Systems   Review of Systems  Gastrointestinal:  Positive for vomiting.  All other systems reviewed and are negative.   Physical Exam Updated Vital Signs BP 121/79   Pulse 84   Temp 97.9 F (36.6 C) (Oral)   Resp 18   Ht 5\' 10"  (1.778 m)   Wt 97.5 kg   SpO2 95%   BMI 30.85 kg/m  Physical Exam Vitals and nursing note reviewed.  Constitutional:      General: He is not in acute distress.    Appearance: Normal appearance.  HENT:     Head: Normocephalic and atraumatic.     Ears:     Comments: Left  external auditory canal is normal.  There is some evidence of bleeding in the right auditory canal.  TMs are difficult to visualize bilaterally but portions visible did not appear to be ruptured and overall appeared normal. Eyes:     General:        Right eye: No discharge.        Left eye: No discharge.  Cardiovascular:     Comments: Regular rate and rhythm.  S1/S2 are distinct without any evidence of murmur, rubs, or gallops.  Radial pulses are 2+ bilaterally.  Dorsalis pedis pulses are 2+ bilaterally.  No evidence of pedal edema. Pulmonary:     Comments: Clear to auscultation bilaterally.  Normal effort.  No respiratory distress.  No evidence of wheezes, rales, or rhonchi heard  throughout. Abdominal:     General: Bowel sounds are normal. There is distension.     Tenderness: There is abdominal tenderness in the left lower quadrant. There is no guarding or rebound.  Musculoskeletal:        General: Normal range of motion.     Cervical back: Neck supple.  Skin:    General: Skin is warm and dry.     Findings: No rash.  Neurological:     General: No focal deficit present.     Mental Status: He is alert.  Psychiatric:        Mood and Affect: Mood normal.        Behavior: Behavior normal.     ED Results / Procedures / Treatments   Labs (all labs ordered are listed, but only abnormal results are displayed) Labs Reviewed  CBC WITH DIFFERENTIAL/PLATELET - Abnormal; Notable for the following components:      Result Value   WBC 12.3 (*)    Neutro Abs 11.0 (*)    Lymphs Abs 0.5 (*)    All other components within normal limits  COMPREHENSIVE METABOLIC PANEL - Abnormal; Notable for the following components:   Glucose, Bld 125 (*)    Creatinine, Ser 1.66 (*)    AST 43 (*)    ALT 56 (*)    Alkaline Phosphatase 34 (*)    Total Bilirubin 1.7 (*)    GFR, Estimated 45 (*)    All other components within normal limits  URINALYSIS, ROUTINE W REFLEX MICROSCOPIC - Abnormal; Notable for the following components:   Glucose, UA >=500 (*)    Hgb urine dipstick MODERATE (*)    Protein, ur 30 (*)    All other components within normal limits  URINALYSIS, MICROSCOPIC (REFLEX) - Abnormal; Notable for the following components:   Bacteria, UA RARE (*)    All other components within normal limits  LIPASE, BLOOD    EKG None  Radiology CT ABDOMEN PELVIS W CONTRAST Result Date: 04/05/2023 CLINICAL DATA:  Abdominal pain, nausea, vomiting, diarrhea EXAM: CT ABDOMEN AND PELVIS WITH CONTRAST TECHNIQUE: Multidetector CT imaging of the abdomen and pelvis was performed using the standard protocol following bolus administration of intravenous contrast. RADIATION DOSE REDUCTION: This  exam was performed according to the departmental dose-optimization program which includes automated exposure control, adjustment of the mA and/or kV according to patient size and/or use of iterative reconstruction technique. CONTRAST:  75mL OMNIPAQUE IOHEXOL 300 MG/ML  SOLN COMPARISON:  Ultrasound abdomen dated 06/29/2022 FINDINGS: Lower chest: No focal consolidation or pulmonary nodule in the lung bases. No pleural effusion or pneumothorax demonstrated. Partially imaged heart size is normal. Hepatobiliary: No focal hepatic lesions. No intra or extrahepatic biliary ductal dilation.  Normal gallbladder. Pancreas: No focal lesions or main ductal dilation. Spleen: Normal in size without focal abnormality. Adrenals/Urinary Tract: No adrenal nodules. No suspicious renal mass or hydronephrosis. Punctate nonobstructing bilateral renal stones. Underdistended urinary bladder. Stomach/Bowel: Normal appearance of the stomach. No evidence of bowel wall thickening, distention, or inflammatory changes. A few prominent loops of small bowel centered in the medial left hemiabdomen. Colonic diverticulosis without acute diverticulitis. Normal appendix. Vascular/Lymphatic: Aortic atherosclerosis. No enlarged abdominal or pelvic lymph nodes. Reproductive: Prostate is unremarkable. Other: No free fluid, fluid collection, or free air. Musculoskeletal: No acute or abnormal lytic or blastic osseous lesions. Left iliac sclerotic focus, likely bone island. Right hip arthroplasty appears intact. Multilevel degenerative changes of the partially imaged thoracic and lumbar spine. IMPRESSION: 1. A few prominent loops of small bowel centered in the medial left hemiabdomen, nonspecific, which may represent enteritis. No current CT findings of obstruction. 2. Punctate nonobstructing bilateral renal stones. 3. Colonic diverticulosis without acute diverticulitis. 4.  Aortic Atherosclerosis (ICD10-I70.0). Electronically Signed   By: Agustin Cree M.D.   On:  04/05/2023 13:11    Procedures Procedures    Medications Ordered in ED Medications  pantoprazole (PROTONIX) EC tablet 80 mg (80 mg Oral Given 04/05/23 1439)  ondansetron (ZOFRAN) injection 4 mg (4 mg Intravenous Given 04/05/23 1132)  ketorolac (TORADOL) 15 MG/ML injection 15 mg (15 mg Intravenous Given 04/05/23 1132)  sodium chloride 0.9 % bolus 500 mL (0 mLs Intravenous Stopped 04/05/23 1443)  iohexol (OMNIPAQUE) 300 MG/ML solution 75 mL (75 mLs Intravenous Contrast Given 04/05/23 1244)  dicyclomine (BENTYL) capsule 10 mg (10 mg Oral Given 04/05/23 1442)    ED Course/ Medical Decision Making/ A&P Clinical Course as of 04/05/23 1452  Thu Apr 05, 2023  1449 On reevaluation, patient is doing much better after fluids, antiemetics, and antireflux medication.  Patient comfortable going home.  Tolerated p.o. fluids without vomiting. [CF]  1449 CBC with Differential(!) There is evidence of leukocytosis. [CF]  1450 Comprehensive metabolic panel(!) Elevated creatinine but seems to be at the patient's baseline.  There is also some evidence of mild transaminitis and elevated bilirubin. [CF]  1450 Urinalysis, Routine w reflex microscopic -Urine, Clean Catch(!) No signs of urinary tract infection. [CF]    Clinical Course User Index [CF] Teressa Lower, PA-C   {   Click here for ABCD2, HEART and other calculators  Medical Decision Making Travis Shea is a 68 y.o. male patient who presents to the emergency department today for further evaluation of left lower quadrant abdominal pain with nausea, vomiting, diarrhea.  Given the patient's history of diverticulosis I am suspicious for diverticulitis.  Gastroenteritis is also a possibility.  With regards to the patient's right ear pain could be a ruptured vessel from the forceful vomiting that he has been having in the last 24 hours.  Do not see any evidence of TM rupture or obvious signs of infection.  Will plan to give him some Toradol, small bolus  of fluids given the patient's last echocardiogram in November, and get a CT scan of the abdomen and pelvis to look for other possibilities.  CT scan did not reveal any significant signs of diverticulitis.  Likely gastroenteritis.  Will treat conservatively with Zofran and Bentyl.  Patient tolerating p.o. fluids here.  Patient comfortable going home.  All questions and concerns addressed.  Strict return precautions were discussed.  He is safe for discharge at this time.  Amount and/or Complexity of Data Reviewed Labs: ordered. Decision-making details documented  in ED Course. Radiology: ordered.  Risk Prescription drug management.    Final Clinical Impression(s) / ED Diagnoses Final diagnoses:  Nausea vomiting and diarrhea    Rx / DC Orders ED Discharge Orders          Ordered    dicyclomine (BENTYL) 20 MG tablet  2 times daily        04/05/23 1452    ondansetron (ZOFRAN-ODT) 4 MG disintegrating tablet  Every 8 hours PRN        04/05/23 1452              Honor Loh [place], [place] 04/05/23 1453    Vanetta Mulders, MD 04/07/23 [id number]

## 2023-04-05 NOTE — Discharge Instructions (Signed)
 Please follow-up with your primary care doctor.  I have written 2 prescriptions. One is for the abdominal cramping and the second 1 is for nausea and vomiting.  You may return to the emergency department for any worsening symptoms.

## 2023-04-08 ENCOUNTER — Other Ambulatory Visit: Payer: Self-pay | Admitting: Internal Medicine

## 2023-04-09 ENCOUNTER — Other Ambulatory Visit (HOSPITAL_COMMUNITY): Payer: Self-pay

## 2023-04-09 ENCOUNTER — Encounter (HOSPITAL_COMMUNITY): Payer: Self-pay

## 2023-04-10 ENCOUNTER — Other Ambulatory Visit: Payer: Self-pay | Admitting: Internal Medicine

## 2023-04-11 ENCOUNTER — Encounter: Payer: Self-pay | Admitting: Dermatology

## 2023-04-12 ENCOUNTER — Encounter: Payer: Self-pay | Admitting: Dermatology

## 2023-04-12 ENCOUNTER — Other Ambulatory Visit: Payer: Self-pay | Admitting: Internal Medicine

## 2023-04-12 ENCOUNTER — Ambulatory Visit (INDEPENDENT_AMBULATORY_CARE_PROVIDER_SITE_OTHER): Payer: No Typology Code available for payment source | Admitting: Dermatology

## 2023-04-12 ENCOUNTER — Other Ambulatory Visit (HOSPITAL_COMMUNITY): Payer: Self-pay

## 2023-04-12 VITALS — BP 152/84 | HR 89 | Temp 98.2°F

## 2023-04-12 DIAGNOSIS — C4491 Basal cell carcinoma of skin, unspecified: Secondary | ICD-10-CM

## 2023-04-12 DIAGNOSIS — C44511 Basal cell carcinoma of skin of breast: Secondary | ICD-10-CM

## 2023-04-12 DIAGNOSIS — L814 Other melanin hyperpigmentation: Secondary | ICD-10-CM

## 2023-04-12 DIAGNOSIS — L579 Skin changes due to chronic exposure to nonionizing radiation, unspecified: Secondary | ICD-10-CM

## 2023-04-12 MED ORDER — OXYCODONE HCL 5 MG PO TABS
5.0000 mg | ORAL_TABLET | Freq: Four times a day (QID) | ORAL | 0 refills | Status: AC | PRN
Start: 2023-04-12 — End: 2023-04-20
  Filled 2023-04-12: qty 8, 2d supply, fill #0

## 2023-04-12 NOTE — Progress Notes (Signed)
 Follow-Up Visit   Subjective  Travis Shea is a 68 y.o. male who presents for the following: Mohs of a nodular Basal Cell Carcinoma on the right breast, biopsied by Dr. Caralyn Guile.  The following portions of the chart were reviewed this encounter and updated as appropriate: medications, allergies, medical history  Review of Systems:  No other skin or systemic complaints except as noted in HPI or Assessment and Plan.  Objective  Well appearing patient in no apparent distress; mood and affect are within normal limits.  A focused examination was performed of the following areas: Right breast Relevant physical exam findings are noted in the Assessment and Plan.   Right Breast Pink pearly papule or plaque with arborizing vessels.    Assessment & Plan   BASAL CELL CARCINOMA (BCC), UNSPECIFIED SITE Right Breast Mohs surgery  Consent obtained: written  Anticoagulation: Is the patient taking prescription anticoagulant and/or aspirin prescribed/recommended by a physician? Yes   Was the anticoagulation regimen changed prior to Mohs? No    Procedure Details: Timeout: pre-procedure verification complete Procedure Prep: patient was prepped and draped in usual sterile fashion Prep type: chlorhexidine Biopsy accession number: [id number] Biopsy lab: [facility] Frozen section biopsy performed: No   Specimen debulked: No   Pre-Op diagnosis: basal cell carcinoma BCC subtype: nodular MohsAIQ Surgical site (if tumor spans multiple areas, please select predominant area): trunk (excluding nipple/areola) Surgery side: right Surgical site (from skin exam): Right Breast Pre-operative length (cm): 2 Pre-operative width (cm): 1.4 Indications for Mohs surgery: anatomic location where tissue conservation is critical and tumor size greater than 2 cm Previously treated? No    Micrographic Surgery Details: Post-operative length (cm): 3.1 Post-operative width (cm): 2.2 Number of Mohs stages: 1 Is this  a complex case (associate members only): No    Stage 1    Tumor features identified on Mohs section: no tumor identified    Depth of defect after stage: subcutaneous fat  Patient tolerance of procedure: tolerated well, no immediate complications  Reconstruction: Was the defect reconstructed? Yes   Was reconstruction performed by the same Mohs surgeon? Yes   Setting of reconstruction: outpatient office When was reconstruction performed? same day Type of reconstruction: linear Linear reconstruction: complex  Opioids: Did the patient receive a prescription for opioid/narcotic related to Mohs surgery? Yes   Indications for opioid/narcotics: patient required additional pain relief despite trial of non-opioid analgesia  Antibiotics: Does patient meet AHA guidelines for endocarditis?: No   Does patient meet AHA guidelines for orthopedic prophylaxis?: No   Were antibiotics given on the day of surgery?: No   Did surgery breach mucosa, expose cartilage/bone, involve an area of lymphedema/inflamed/infected tissue? No    Skin repair Complexity:  Complex Final length (cm):  8 Informed consent: discussed and consent obtained   Timeout: patient name, date of birth, surgical site, and procedure verified   Procedure prep:  Patient was prepped and draped in usual sterile fashion Prep type:  Chlorhexidine Anesthesia: the lesion was anesthetized in a standard fashion   Anesthetic:  1% lidocaine w/ epinephrine 1-100,000 buffered w/ 8.4% NaHCO3 Reason for type of repair: reduce tension to allow closure, allow closure of the large defect and preserve normal anatomy   Undermining: area extensively undermined   Subcutaneous layers (deep stitches):  Suture size:  3-0 Suture type: Monocryl (poliglecaprone 25)   Stitches:  Buried vertical mattress Fine/surface layer approximation (top stitches):  Suture type: cyanoacrylate tissue glue   Hemostasis achieved with: suture, pressure and  electrodesiccation  Outcome: patient tolerated procedure well with no complications   Post-procedure details: sterile dressing applied and wound care instructions given   Dressing type: bandage, petrolatum and pressure dressing   Related Medications oxyCODONE (OXY IR/ROXICODONE) 5 MG immediate release tablet Take 1 tablet (5 mg total) by mouth every 6 (six) hours as needed for up to 8 days for severe pain (pain score 7-10).  No follow-ups on file.   04/12/2023  HISTORY OF PRESENT ILLNESS  Travis Shea is seen in consultation at the request of Dr. Caralyn Guile for biopsy-proven Nodular Basal Cell Carcinoma of the right chest. They note that the area has been present for about 6 months increasing in size with time.  There is no history of previous treatment.  Reports no other new or changing lesions and has no other complaints today.  Medications and allergies: see patient chart.  Review of systems: Reviewed 8 systems and notable for the above skin cancer.  All other systems reviewed are unremarkable/negative, unless noted in the HPI. Past medical history, surgical history, family history, social history were also reviewed and are noted in the chart/questionnaire.    PHYSICAL EXAMINATION  General: Well-appearing, in no acute distress, alert and oriented x 4. Vitals reviewed in chart (if available).   Skin: Exam reveals a 2.0 x 1.4 cm erythematous papule and biopsy scar on the right chest. There are rhytids, telangiectasias, and lentigines, consistent with photodamage.  Biopsy report(s) reviewed, confirming the diagnosis.  ASSESSMENT  1) Nodular Basal Cell Carcinoma on the right chest 2) photodamage 3) solar lentigines   PLAN   1. Due to location, size, histology, or recurrence and the likelihood of subclinical extension as well as the need to conserve normal surrounding tissue, the patient was deemed acceptable for Mohs micrographic surgery (MMS).  The nature and purpose of the procedure,  associated benefits and risks including recurrence and scarring, possible complications such as pain, infection, and bleeding, and alternative methods of treatment if appropriate were discussed with the patient during consent. The lesion location was verified by the patient, by reviewing previous notes, pathology reports, and by photographs as well as angulation measurements if available.  Informed consent was reviewed and signed by the patient, and timeout was performed at 9:30 AM. See op note below.  2. For the photodamage and solar lentigines, sun protection discussed/information given on OTC sunscreens, and we recommend continued regular follow-up with primary dermatologist every 6 months or sooner for any growing, bleeding, or changing lesions. 3. Prognosis and future surveillance discussed. 4. Letter with treatment outcome sent to referring provider. 5. Pain acetaminophen/ibuprofen/oxycodone 5 mg  MOHS MICROGRAPHIC SURGERY AND RECONSTRUCTION  Initial size:   2.0 x 1.4 cm Surgical defect/wound size: 3.1 x 2.2 cm Anesthesia:    0.33% lidocaine with 1:200,000 epinephrine EBL:    <5 mL Complications:  None Repair type:   Complex SQ suture:   3-0 Monocryl Cutaneous suture:  Dermabond Final size of the repair: 8.0 cm  Stages: 1  STAGE I: Anesthesia achieved with 0.5% lidocaine with 1:200,000 epinephrine. ChloraPrep applied. 1 section(s) excised using Mohs technique (this includes total peripheral and deep tissue margin excision and evaluation with frozen sections, excised and interpreted by the same physician). The tumor was first debulked and then excised with an approx. 2mm margin.  Hemostasis was achieved with electrocautery as needed.  The specimen was then oriented, subdivided/relaxed, inked, and processed using Mohs technique.    Frozen section analysis revealed a clear deep and peripheral margin.  Reconstruction  The surgical wound was then cleaned, prepped, and re-anesthetized as  above. Wound edges were undermined extensively along at least one entire edge and at a distance equal to or greater than the width of the defect (see wound defect size above) in order to achieve closure and decrease wound tension and anatomic distortion. Redundant tissue repair including standing cone removal was performed. Hemostasis was achieved with electrocautery. Subcutaneous and epidermal tissues were approximated with the above sutures. The surgical site was then lightly scrubbed with sterile, saline-soaked gauze. Steri-strips were applied, and the area was then bandaged using Vaseline ointment, non-adherent gauze, gauze pads, and tape to provide an adequate pressure dressing. The patient tolerated the procedure well, was given detailed written and verbal wound care instructions, and was discharged in good condition.   The patient will follow-up: 2 weeks.   Documentation: I have reviewed the above documentation for accuracy and completeness, and I agree with the above.  Gwenith Daily, MD

## 2023-04-12 NOTE — Patient Instructions (Addendum)
 Wound Care Instructions for After Surgery  On the day following your surgery, you should begin doing daily dressing changes until your sutures are removed: Remove the bandage. Cleanse the wound gently with soap and water.  Make sure you then dry the skin surrounding the wound completely or the tape will not stick to the skin. Do not use cotton balls on the wound. After the wound is clean and dry, apply the ointment (either prescription antibiotic prescribed by your doctor or plain Vaseline if nothing was prescribed) gently with a Q-tip. If you are using a bandaid to cover: Apply a bandaid large enough to cover the entire wound. If you do not have a bandaid large enough to cover the wound OR if you are sensitive to bandaid adhesive: Cut a non-stick pad (such as Telfa) to fit the size of the wound.  Cover the wound with the non-stick pad. If the wound is draining, you may want to add a small amount of gauze on top of the non-stick pad for a little added compression to the area. Use tape to seal the area completely.  For the next 1-2 weeks: Be sure to keep the wound moist with ointment 24/7 to ensure best healing. If you are unable to cover the wound with a bandage to hold the ointment in place, you may need to reapply the ointment several times a day. Do not bend over or lift heavy items to reduce the chance of elevated blood pressure to the wound. Do not participate in particularly strenuous activities.  Below is a list of dressing supplies you might need.  Cotton-tipped applicators - Q-tips Gauze pads (2x2 and/or 4x4) - All-Purpose Sponges New and clean tube of petroleum jelly (Vaseline) OR prescription antibiotic ointment if prescribed Either a bandaid large enough to cover the entire wound OR non-stick dressing material (Telfa) and Tape (Paper or Hypafix)  FOR ADULT SURGERY PATIENTS: If you need something for pain relief, you may take 1 extra strength Tylenol (acetaminophen) and 2  ibuprofen (200 mg) together every 4 hours as needed. (Do not take these medications if you are allergic to them or if you know you cannot take them for any other reason). Typically you may only need pain medication for 1-3 days.   Comments on the Post-Operative Period Slight swelling and redness often appear around the wound. This is normal and will disappear within several days following the surgery. The healing wound will drain a brownish-red-yellow discharge during healing. This is a normal phase of wound healing. As the wound begins to heal, the drainage may increase in amount. Again, this drainage is normal. Notify us if the drainage becomes persistently bloody, excessively swollen, or intensely painful or develops a foul odor or red streaks.  The healing wound will also typically be itchy. This is normal. If you have severe or persistent pain, Notify us if the discomfort is severe or persistent. Avoid alcoholic beverages when taking pain medicine.  In Case of Wound Hemorrhage A wound hemorrhage is when the bandage suddenly becomes soaked with bright red blood and flows profusely. If this happens, sit down or lie down with your head elevated. If the wound has a dressing on it, do not remove the dressing. Apply pressure to the existing gauze. If the wound is not covered, use a gauze pad to apply pressure and continue applying the pressure for 20 minutes without peeking. DO NOT COVER THE WOUND WITH A LARGE TOWEL OR WASH CLOTH. Release your hand from  the wound site but do not remove the dressing. If the bleeding has stopped, gently clean around the wound. Leave the dressing in place for 24 hours if possible. This wait time allows the blood vessels to close off so that you do not spark a new round of bleeding by disrupting the newly clotted blood vessels with an immediate dressing change. If the bleeding does not subside, continue to hold pressure for 40 minutes. If bleeding continues, page your  physician, contact an After Hours clinic or go to the Emergency Room.   Important Information   Due to recent changes in healthcare laws, you may see results of your pathology and/or laboratory studies on MyChart before the doctors have had a chance to review them. We understand that in some cases there may be results that are confusing or concerning to you. Please understand that not all results are received at the same time and often the doctors may need to interpret multiple results in order to provide you with the best plan of care or course of treatment. Therefore, we ask that you please give [place] 2 business days to thoroughly review all your results before contacting the office for clarification. Should we see a critical lab result, you will be contacted sooner.     If You Need Anything After Your Visit   If you have any questions or concerns for your doctor, please call our main line at [phone]. If no one answers, please leave a voicemail as directed and we will return your call as soon as possible. Messages left after 4 pm will be answered the following business day.    You may also send [place] a message via MyChart. We typically respond to MyChart messages within 1-2 business days.  For prescription refills, please ask your pharmacy to contact our office. Our fax number is [phone].  If you have an urgent issue when the clinic is closed that cannot wait until the next business day, you can page your doctor at the number below.     Please note that while we do our best to be available for urgent issues outside of office hours, we are not available 24/7.    If you have an urgent issue and are unable to reach [place], you may choose to seek medical care at your doctor's office, retail clinic, urgent care center, or emergency room.   If you have a medical emergency, please immediately call 911 or go to the emergency department. In the event of inclement weather, please call our main line at  [phone] for an update on the status of any delays or closures.  Dermatology Medication Tips: Please keep the boxes that topical medications come in in order to help keep track of the instructions about where and how to use these. Pharmacies typically print the medication instructions only on the boxes and not directly on the medication tubes.   If your medication is too expensive, please contact our office at [phone] or send [place] a message through MyChart.    We are unable to tell what your co-pay for medications will be in advance as this is different depending on your insurance coverage. However, we may be able to find a substitute medication at lower cost or fill out paperwork to get insurance to cover a needed medication.    If a prior authorization is required to get your medication covered by your insurance company, please allow [place] 1-2 business days to complete this process.   Drug  prices often vary depending on where the prescription is filled and some pharmacies may offer cheaper prices.   The website www.goodrx.com contains coupons for medications through different pharmacies. The prices here do not account for what the cost may be with help from insurance (it may be cheaper with your insurance), but the website can give you the price if you did not use any insurance.  - You can print the associated coupon and take it with your prescription to the pharmacy.  - You may also stop by our office during regular business hours and pick up a GoodRx coupon card.  - If you need your prescription sent electronically to a different pharmacy, notify our office through [facility] or by phone at [phone]

## 2023-04-13 ENCOUNTER — Other Ambulatory Visit: Payer: Self-pay | Admitting: Internal Medicine

## 2023-04-13 ENCOUNTER — Ambulatory Visit (HOSPITAL_BASED_OUTPATIENT_CLINIC_OR_DEPARTMENT_OTHER)
Admission: RE | Admit: 2023-04-13 | Discharge: 2023-04-13 | Disposition: A | Discharge status: Home / Self Care | Payer: No Typology Code available for payment source | Source: Ambulatory Visit | Attending: Cardiology | Admitting: Cardiology

## 2023-04-13 ENCOUNTER — Other Ambulatory Visit (HOSPITAL_COMMUNITY): Payer: Self-pay | Admitting: Cardiology

## 2023-04-13 ENCOUNTER — Ambulatory Visit (HOSPITAL_COMMUNITY)
Admission: RE | Admit: 2023-04-13 | Discharge: 2023-04-13 | Disposition: A | Discharge status: Home / Self Care | Payer: No Typology Code available for payment source | Source: Ambulatory Visit | Attending: Cardiology | Admitting: Cardiology

## 2023-04-13 ENCOUNTER — Other Ambulatory Visit (HOSPITAL_COMMUNITY): Payer: Self-pay

## 2023-04-13 ENCOUNTER — Encounter (HOSPITAL_COMMUNITY): Payer: Self-pay | Admitting: Cardiology

## 2023-04-13 VITALS — BP 104/70 | HR 57 | Wt 218.6 lb

## 2023-04-13 DIAGNOSIS — I712 Thoracic aortic aneurysm, without rupture, unspecified: Secondary | ICD-10-CM | POA: Insufficient documentation

## 2023-04-13 DIAGNOSIS — Z79899 Other long term (current) drug therapy: Secondary | ICD-10-CM | POA: Insufficient documentation

## 2023-04-13 DIAGNOSIS — R7989 Other specified abnormal findings of blood chemistry: Secondary | ICD-10-CM | POA: Diagnosis not present

## 2023-04-13 DIAGNOSIS — Z7982 Long term (current) use of aspirin: Secondary | ICD-10-CM | POA: Insufficient documentation

## 2023-04-13 DIAGNOSIS — I428 Other cardiomyopathies: Secondary | ICD-10-CM | POA: Diagnosis not present

## 2023-04-13 DIAGNOSIS — I2510 Atherosclerotic heart disease of native coronary artery without angina pectoris: Secondary | ICD-10-CM | POA: Diagnosis not present

## 2023-04-13 DIAGNOSIS — E785 Hyperlipidemia, unspecified: Secondary | ICD-10-CM | POA: Insufficient documentation

## 2023-04-13 DIAGNOSIS — Z7984 Long term (current) use of oral hypoglycemic drugs: Secondary | ICD-10-CM | POA: Diagnosis not present

## 2023-04-13 DIAGNOSIS — I5042 Chronic combined systolic (congestive) and diastolic (congestive) heart failure: Secondary | ICD-10-CM | POA: Insufficient documentation

## 2023-04-13 DIAGNOSIS — F1410 Cocaine abuse, uncomplicated: Secondary | ICD-10-CM | POA: Diagnosis not present

## 2023-04-13 DIAGNOSIS — N183 Chronic kidney disease, stage 3 unspecified: Secondary | ICD-10-CM | POA: Diagnosis not present

## 2023-04-13 DIAGNOSIS — I130 Hypertensive heart and chronic kidney disease with heart failure and stage 1 through stage 4 chronic kidney disease, or unspecified chronic kidney disease: Secondary | ICD-10-CM | POA: Insufficient documentation

## 2023-04-13 DIAGNOSIS — I350 Nonrheumatic aortic (valve) stenosis: Secondary | ICD-10-CM | POA: Diagnosis not present

## 2023-04-13 LAB — ECHOCARDIOGRAM COMPLETE
AR max vel: 1.59 cm2
AV Area VTI: 1.67 cm2
AV Area mean vel: 1.56 cm2
AV Mean grad: 11 mmHg
AV Peak grad: 20.8 mmHg
Ao pk vel: 2.28 m/s
Area-P 1/2: 2.73 cm2
Calc EF: 42.2 %
Est EF: 40
P 1/2 time: 677 ms
S' Lateral: 5 cm
Single Plane A2C EF: 44.4 %
Single Plane A4C EF: 38.8 %

## 2023-04-13 MED ORDER — ASPIRIN 81 MG PO CHEW: 81.0000 mg | CHEWABLE_TABLET | Freq: Every day | ORAL | 3 refills | Status: DC

## 2023-04-13 NOTE — Progress Notes (Signed)
  Echocardiogram 2D Echocardiogram has been performed.  Travis Shea 04/13/2023, 3:05 PM

## 2023-04-13 NOTE — Patient Instructions (Signed)
 There has been no changes to your medications.  Labs done today, your results will be available in MyChart, we will contact you for abnormal readings.  Your provider has ordered and ultra sound of your abdomen. You will be called to have this test arranged.  Your provider has ordered a sleep study test for you. You will be called to have this test arranged.  Your physician recommends that you schedule a follow-up appointment in: 3 months.  If you have any questions or concerns before your next appointment please send [place] a message through [place] or call our office at [phone].    TO LEAVE A MESSAGE FOR THE NURSE SELECT OPTION 2, PLEASE LEAVE A MESSAGE INCLUDING: YOUR NAME DATE OF BIRTH CALL BACK NUMBER REASON FOR CALL**this is important as we prioritize the call backs  YOU WILL RECEIVE A CALL BACK THE SAME DAY AS LONG AS YOU CALL BEFORE 4:00 PM  At the [facility], you and your health needs are our priority. As part of our continuing mission to provide you with exceptional heart care, we have created designated Provider Care Teams. These Care Teams include your primary Cardiologist (physician) and Advanced Practice Providers (APPs- Physician Assistants and Nurse Practitioners) who all work together to provide you with the care you need, when you need it.   You may see any of the following providers on your designated Care Team at your next follow up: Dr Arvilla Meres Dr Marca Ancona Dr. Dorthula Nettles Dr. Clearnce Hasten Amy Filbert Schilder, NP Robbie Lis, [place] [facility] [place], [place] Brynda Peon, NP [place] Lee, NP Clarisa Kindred, NP Karle Plumber, PharmD Enos Fling, PharmD   Please be sure to bring in all your medications bottles to every appointment.    Thank you for choosing [facility]

## 2023-04-15 NOTE — Progress Notes (Signed)
 PCP: Myrlene Broker, MD Cardiology: Dr. Shirlee Latch  68 y.o. with past medical history of HTN, GERD, ETOH use, cocaine use and osteoarthritis.  Patient reports long history of alcohol and cocaine use (last use 10 days ago), and prior anabolic steroid use in 20's. Seen 11/28/22 by PCP for fatigue and new cough and was treated for pneumonia with course of Doxycyline.  Pt presented to the ED in 11/24 after syncopal event while sitting on the couch at home. During event he did hit his head on the floor. The day before had tolerated working in the yard for about 3 hours. CT head no acute findings, small vessel ischemic changes. CT PE negative, but notable for moderate coronary artery calcification and 4.3 cm ascending thoracic aortic aneurysm. Lab on admission K 3.4, Cr 1.52, AST/ALT 51/51, Trop 108>97, BNP 75.7. CXR normal. Abd US showed 3.3 cm abd aortic aneurysm. Echo showed EF 20-25%, RV mildly reduced, mild AS (cannot rule out bicuspid valve), mild AI.  Patient had gentle diuresis in the hospital and was started on GDMT for CHF. Coronary angiography showed 75% mid to distal LAD stenosis that did not explain the extent of his cardiomyopathy.  Cardiac MRI showed EF 23%, mid-wall LGE in the basal septum, mid inferoseptum, mid inferolateral wall and transmural LGE in the apical lateral wall; RV EF 42%, ECV 25%.  BP was controlled. No arrhythmias noted in the hospital, and he was sent home with Lifevest.  He sent the Lifevest back after about 2 wks.   Echo was done today and reviewed, EF 40% with mild LV dilation, mild LVH, normal RV, mild AS mean gradient 11 mmHg with trivial AI, IVC normal.   Patient returns for followup of CHF. He has been going to the gym.  Most days feels good, but on some days gets quite fatigued.  Dyspnea only with heavy exertion.  No chest pain. He rarely drinks ETOH and has not used any cocaine since discharge.  No orthopnea/PND. He snores and has daytime sleepiness.   ECG (personally  reviewed): NSR, inferior and anterolateral TWIs  Labs (11/24): Lp(a) 13, K 4, creatinine 1.44, LDL 102 Labs (12/24): K 4.6, creatinine 1.51 Labs (2/25): K 4.6, creatinine 1.66, LFTs mildly elevated, hgb 16.5.   PMH: 1. HTN 2. GERD 3. Cocaine abuse 4. Ascending aortic aneurysm: 4.3 cm on 11/24 CTA chest.  5. CAD: Coronary angiography (11/24) with 75% mid-distal LAD stenosis. Medically managed.  6. Chronic systolic CHF: Primarily nonischemic cardiomyopathy, ?related to HTN or cocaine.  - Echo (11/24): EF 20-25%, moderate LV dilation, mild RV dysfunction, mild-moderate AS mean gradient 12 mmHg.  - Cardiac MRI (11/24): LV EF 23%, mild LVH, mid-wall LGE in the basal septum, mid inferoseptum, mid inferolateral wall and transmural LGE in the apical lateral wall; RV EF 42%, ECV 25%.  - Echo (2/25): EF 40% with mild LV dilation, mild LVH, normal RV, mild AS mean gradient 11 mmHg with trivial AI, IVC normal.  7. Aortic stenosis: Mild-moderate on 11/24 echo, possible bicuspid.  - Mild AS on 2/25 echo.  8. Syncope: 11/24 9. CKD stage 3  SH: Retired, married.  H/o ETOH and cocaine abuse.   Family History  Problem Relation Age of Onset   Dementia Mother    Lung cancer Father    Other Sister        Lyme's disease   Diabetes Maternal Grandmother    Colon cancer Neg Hx    Colon polyps Neg Hx  Rectal cancer Neg Hx    Stomach cancer Neg Hx    ROS: All systems reviewed and negative except as per HPI.   General: NAD Neck: No JVD, no thyromegaly or thyroid nodule.  Lungs: Clear to auscultation bilaterally with normal respiratory effort. CV: Nondisplaced PMI.  Heart regular S1/S2, no S3/S4, 1/6 SEM RUSB.  No peripheral edema.  No carotid bruit.  Normal pedal pulses.  Abdomen: Soft, nontender, no hepatosplenomegaly, no distention.  Skin: Intact without lesions or rashes.  Neurologic: Alert and oriented x 3.  Psych: Normal affect. Extremities: No clubbing or cyanosis.  HEENT: Normal.    Assessment/Plan: 1. Chronic systolic CHF: Primarily nonischemic cardiomyopathy. Echo in 11/24 with  EF 20-25%, moderate LV dilation, mild-moderate AS with mean gradient 14 mmHg and AVA 1.25 cm^2, mild AI, IVC normal.  He was admitted in 11/24 due to syncopal episode.  Patient has history of untreated HTN as well as cocaine use. No strong FH of cardiomyopathy. It is possible that the cardiomyopathy is due to cocaine/HTN.  However, he had significant LGE on cardiac MRI suggesting possibility of myocarditis or some form of infiltrative disease.  Cardiac MRI in 11/24 showed LV EF 23%, mild LVH, mid-wall LGE in the basal septum, mid inferoseptum, mid inferolateral wall and transmural LGE in the apical lateral wall; RV EF 42%, ECV 25%.  LHC/RHC in 11/24 showed normal filling pressures, low CI 1.9.  He had a 75% mid-distal LAD stenosis, but I do not think that this explains the extent of his cardiomyopathy.  Repeat echo in 2/25 showed improved function, EF 40% with mild LV dilation, mild LVH, normal RV, mild AS mean gradient 11 mmHg with trivial AI, IVC normal.   NYHA class II, not volume overloaded on exam.  - Continue Entresto 97/103 bid.  - Continue Farxiga 10 mg daily.  - Continue Coreg 18.75 mg bid, no BP or HR room to increase.  - Continue spironolactone 25 mg daily. BMET/BNP today.  - Imperative to quit cocaine => he says that he has quit since 10/24.  - EF now improved and out of ICD range.  2.  Syncope: Occurred on couch while watching TV in 11/24, no prodrome.  I worry that this was an arrhythmic event. No events on telemetry in hospital in 11/24.  No further syncope/presyncope. He wore a Lifevest for a couple of weeks after his admission but sent it back. No further syncope or presyncope.  - EF is now 40%, out of ICD range.   3. CKD stage 3: BMET today.  4. CAD: Cath in 11/24 showed 75% mid to distal LAD stenosis, otherwise nonobstructive disease.  I do not think he had ACS.  I do not think that  his degree of CAD explains markedly low EF (?due to cocaine abuse vs HTN vs myocarditis).  He has no anginal-type chest pain.  Manage medically for now.  - ASA 81 daily - Continue Crestor, check lipids.  5. Aortic stenosis: Mild to moderate on echo in 11/24, mild AS on 2/25 echo.  Hard to rule out bicuspid valve.  Will need to follow going forward.  .  6. Ascending aortic dilation: 4.3 cm on CT in 11/24. Follow with time and control BP.  7. HTN: BP controlled on GDMT.  8. Cocaine use: Daily x yrs, he says he has quit since hospitalization in 10/24.  9. Suspect OSA:  I will have him do a home sleep study, ordered today.   Followup in 3 months  with APP.   I spent 32 minutes reviewing records, interviewing/examining patient, and managing orders.   Marca Ancona 04/15/2023

## 2023-04-16 ENCOUNTER — Encounter (HOSPITAL_COMMUNITY): Payer: Self-pay

## 2023-04-16 ENCOUNTER — Other Ambulatory Visit (HOSPITAL_COMMUNITY): Payer: Self-pay

## 2023-04-16 LAB — VIRAL HEPATITIS HBV, HCV
HCV Ab: NONREACTIVE
Hep B Core Total Ab: NEGATIVE
Hep B Surface Ab, Qual: REACTIVE
Hepatitis B Surface Ag: NEGATIVE

## 2023-04-16 LAB — HCV INTERPRETATION

## 2023-04-19 ENCOUNTER — Other Ambulatory Visit: Payer: Self-pay | Admitting: Internal Medicine

## 2023-04-19 ENCOUNTER — Other Ambulatory Visit (HOSPITAL_COMMUNITY): Payer: Self-pay

## 2023-04-20 ENCOUNTER — Ambulatory Visit (HOSPITAL_COMMUNITY)
Admission: RE | Admit: 2023-04-20 | Discharge: 2023-04-20 | Disposition: A | Discharge status: Home / Self Care | Source: Ambulatory Visit | Attending: Cardiology | Admitting: Cardiology

## 2023-04-20 DIAGNOSIS — R7989 Other specified abnormal findings of blood chemistry: Secondary | ICD-10-CM | POA: Insufficient documentation

## 2023-04-23 ENCOUNTER — Encounter (HOSPITAL_COMMUNITY): Payer: Self-pay

## 2023-04-23 ENCOUNTER — Other Ambulatory Visit (HOSPITAL_COMMUNITY): Payer: Self-pay

## 2023-04-25 ENCOUNTER — Encounter: Payer: Self-pay | Admitting: Dermatology

## 2023-04-26 ENCOUNTER — Ambulatory Visit (INDEPENDENT_AMBULATORY_CARE_PROVIDER_SITE_OTHER): Payer: No Typology Code available for payment source | Admitting: Dermatology

## 2023-04-26 ENCOUNTER — Encounter: Payer: Self-pay | Admitting: Dermatology

## 2023-04-26 ENCOUNTER — Other Ambulatory Visit (HOSPITAL_COMMUNITY): Payer: Self-pay

## 2023-04-26 ENCOUNTER — Other Ambulatory Visit: Payer: Self-pay

## 2023-04-26 VITALS — BP 155/112 | Temp 98.3°F

## 2023-04-26 DIAGNOSIS — T300 Burn of unspecified body region, unspecified degree: Secondary | ICD-10-CM

## 2023-04-26 DIAGNOSIS — C44511 Basal cell carcinoma of skin of breast: Secondary | ICD-10-CM

## 2023-04-26 DIAGNOSIS — Z85828 Personal history of other malignant neoplasm of skin: Secondary | ICD-10-CM | POA: Diagnosis not present

## 2023-04-26 DIAGNOSIS — X102XXA Contact with fats and cooking oils, initial encounter: Secondary | ICD-10-CM | POA: Diagnosis not present

## 2023-04-26 DIAGNOSIS — L814 Other melanin hyperpigmentation: Secondary | ICD-10-CM

## 2023-04-26 DIAGNOSIS — L579 Skin changes due to chronic exposure to nonionizing radiation, unspecified: Secondary | ICD-10-CM | POA: Diagnosis not present

## 2023-04-26 DIAGNOSIS — T2027XA Burn of second degree of neck, initial encounter: Secondary | ICD-10-CM

## 2023-04-26 DIAGNOSIS — L905 Scar conditions and fibrosis of skin: Secondary | ICD-10-CM | POA: Diagnosis not present

## 2023-04-26 DIAGNOSIS — C4491 Basal cell carcinoma of skin, unspecified: Secondary | ICD-10-CM

## 2023-04-26 DIAGNOSIS — T1490XD Injury, unspecified, subsequent encounter: Secondary | ICD-10-CM

## 2023-04-26 HISTORY — DX: Basal cell carcinoma of skin, unspecified: C44.91

## 2023-04-26 MED ORDER — MUPIROCIN 2 % EX OINT
1.0000 | TOPICAL_OINTMENT | Freq: Two times a day (BID) | CUTANEOUS | 2 refills | Status: DC
  Filled 2023-04-26: qty 22, 10d supply, fill #0

## 2023-04-26 MED ORDER — MUPIROCIN 2 % EX OINT
1.0000 | TOPICAL_OINTMENT | Freq: Two times a day (BID) | CUTANEOUS | 2 refills | Status: DC
  Filled 2023-04-26: qty 176, 88d supply, fill #0
  Filled 2023-04-26: qty 176, 90d supply, fill #0

## 2023-04-26 MED ORDER — MUPIROCIN 2 % EX OINT
1.0000 | TOPICAL_OINTMENT | Freq: Two times a day (BID) | CUTANEOUS | 2 refills | Status: DC
Start: 2023-04-26 — End: 2023-07-11
  Filled 2023-04-26: qty 176, 30d supply, fill #0
  Filled 2023-06-03: qty 176, 30d supply, fill #1

## 2023-04-26 NOTE — Patient Instructions (Signed)
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
## 2023-04-27 ENCOUNTER — Encounter (HOSPITAL_COMMUNITY): Payer: Self-pay

## 2023-04-30 ENCOUNTER — Other Ambulatory Visit: Payer: Self-pay | Admitting: Internal Medicine

## 2023-04-30 ENCOUNTER — Other Ambulatory Visit: Payer: Self-pay

## 2023-04-30 ENCOUNTER — Encounter (HOSPITAL_COMMUNITY): Payer: Self-pay

## 2023-04-30 ENCOUNTER — Other Ambulatory Visit (HOSPITAL_COMMUNITY): Payer: Self-pay

## 2023-05-01 ENCOUNTER — Other Ambulatory Visit (HOSPITAL_COMMUNITY): Payer: Self-pay

## 2023-05-01 ENCOUNTER — Encounter (HOSPITAL_COMMUNITY): Payer: Self-pay

## 2023-05-02 ENCOUNTER — Encounter: Payer: Self-pay | Admitting: Dermatology

## 2023-05-02 ENCOUNTER — Other Ambulatory Visit (HOSPITAL_COMMUNITY): Payer: Self-pay | Admitting: Cardiology

## 2023-05-22 ENCOUNTER — Other Ambulatory Visit: Payer: Self-pay | Admitting: Internal Medicine

## 2023-05-24 ENCOUNTER — Other Ambulatory Visit (HOSPITAL_COMMUNITY): Payer: Self-pay

## 2023-05-24 ENCOUNTER — Other Ambulatory Visit (HOSPITAL_BASED_OUTPATIENT_CLINIC_OR_DEPARTMENT_OTHER): Payer: Self-pay

## 2023-05-24 MED ORDER — DICYCLOMINE HCL 20 MG PO TABS
20.0000 mg | ORAL_TABLET | Freq: Two times a day (BID) | ORAL | 0 refills | Status: DC
  Filled 2023-05-24: qty 20, 10d supply, fill #0

## 2023-05-29 ENCOUNTER — Other Ambulatory Visit (HOSPITAL_COMMUNITY): Payer: Self-pay

## 2023-05-29 ENCOUNTER — Encounter: Payer: Self-pay | Admitting: Internal Medicine

## 2023-05-29 ENCOUNTER — Ambulatory Visit (INDEPENDENT_AMBULATORY_CARE_PROVIDER_SITE_OTHER): Payer: No Typology Code available for payment source | Admitting: Internal Medicine

## 2023-05-29 VITALS — BP 114/80 | HR 67 | Temp 98.1°F | Ht 70.0 in | Wt 219.0 lb

## 2023-05-29 DIAGNOSIS — I5042 Chronic combined systolic (congestive) and diastolic (congestive) heart failure: Secondary | ICD-10-CM

## 2023-05-29 DIAGNOSIS — I10 Essential (primary) hypertension: Secondary | ICD-10-CM

## 2023-05-29 DIAGNOSIS — R5383 Other fatigue: Secondary | ICD-10-CM

## 2023-05-29 DIAGNOSIS — Z23 Encounter for immunization: Secondary | ICD-10-CM

## 2023-05-29 MED ORDER — ASPIRIN 81 MG PO CHEW: 81.0000 mg | CHEWABLE_TABLET | Freq: Every day | ORAL | Status: AC

## 2023-05-29 MED ORDER — DICYCLOMINE HCL 20 MG PO TABS
20.0000 mg | ORAL_TABLET | Freq: Two times a day (BID) | ORAL | 6 refills | Status: DC
  Filled 2023-05-29 – 2023-06-03 (×2): qty 60, 30d supply, fill #0

## 2023-05-29 MED ORDER — TRAZODONE HCL 50 MG PO TABS
50.0000 mg | ORAL_TABLET | Freq: Every day | ORAL | 0 refills | Status: DC
  Filled 2023-05-29: qty 30, 30d supply, fill #0

## 2023-05-29 MED ORDER — OMEPRAZOLE 40 MG PO CPDR
40.0000 mg | DELAYED_RELEASE_CAPSULE | Freq: Every day | ORAL | 3 refills | Status: DC
  Filled 2023-05-29 – 2023-06-20 (×8): qty 90, 90d supply, fill #0
  Filled 2023-08-22 – 2023-08-27 (×2): qty 90, 90d supply, fill #1
  Filled 2023-10-17 – 2023-11-05 (×3): qty 90, 90d supply, fill #2
  Filled 2024-01-02 – 2024-01-21 (×2): qty 90, 90d supply, fill #3

## 2023-05-29 MED ORDER — ONDANSETRON 4 MG PO TBDP
4.0000 mg | ORAL_TABLET | Freq: Three times a day (TID) | ORAL | 1 refills | Status: DC | PRN
  Filled 2023-05-29: qty 20, 7d supply, fill #0
  Filled 2023-08-27: qty 20, 7d supply, fill #1

## 2023-05-29 NOTE — Assessment & Plan Note (Signed)
 BP at goal no orthostatic hypotension. Taking coreg, entresto and spironolactone. Checking CMP and continue.

## 2023-05-29 NOTE — Assessment & Plan Note (Signed)
 Improved EF on recent ECHO. Checking CMP and BNP today due to increasing breathlessness. Is taking farxiga and coreg and entresto and spironolactone.

## 2023-05-29 NOTE — Patient Instructions (Addendum)
 We have given you the hep a shot today.  We will check the labs today.  We have sent in trazodone for sleep

## 2023-05-29 NOTE — Assessment & Plan Note (Signed)
 Checking HgA1c and B12 today. B12 was low previously. TSH normal fall 2024 so not likely cause.

## 2023-05-29 NOTE — Progress Notes (Signed)
   Subjective:   Patient ID: Travis Shea, male    DOB: Mar 04, 1955, 68 y.o.   MRN: [id number]  HPI The patient is a 68 YO man coming in for more tiredness and some breathlessness. Denies SOB with lying flat or swelling in legs or stomach. Weight is stable lately. Taking all medications. Recent ECHO improved from before. Not sleeping well.   Review of Systems  Constitutional:  Positive for activity change and fatigue.  HENT: Negative.    Eyes: Negative.   Respiratory:  Positive for shortness of breath. Negative for cough and chest tightness.   Cardiovascular:  Negative for chest pain, palpitations and leg swelling.  Gastrointestinal:  Negative for abdominal distention, abdominal pain, constipation, diarrhea, nausea and vomiting.  Musculoskeletal: Negative.   Skin: Negative.   Neurological: Negative.   Psychiatric/Behavioral:  Positive for sleep disturbance.     Objective:  Physical Exam Constitutional:      Appearance: He is well-developed.  HENT:     Head: Normocephalic and atraumatic.  Cardiovascular:     Rate and Rhythm: Normal rate and regular rhythm.  Pulmonary:     Effort: Pulmonary effort is normal. No respiratory distress.     Breath sounds: Normal breath sounds. No wheezing or rales.  Abdominal:     General: Bowel sounds are normal. There is no distension.     Palpations: Abdomen is soft.     Tenderness: There is no abdominal tenderness. There is no rebound.  Musculoskeletal:     Cervical back: Normal range of motion.  Skin:    General: Skin is warm and dry.  Neurological:     Mental Status: He is alert and oriented to person, place, and time.     Coordination: Coordination normal.     Vitals:   05/29/23 1604  BP: 114/80  Pulse: 67  Temp: 98.1 F (36.7 C)  TempSrc: Oral  SpO2: 98%  Weight: 219 lb (99.3 kg)  Height: 5\' 10"  (1.778 m)    Assessment & Plan:  Hep A given at visit

## 2023-05-30 ENCOUNTER — Other Ambulatory Visit (HOSPITAL_COMMUNITY): Payer: Self-pay

## 2023-05-30 LAB — CBC
HCT: 47.8 % (ref 39.0–52.0)
Hemoglobin: 16 g/dL (ref 13.0–17.0)
MCHC: 33.4 g/dL (ref 30.0–36.0)
MCV: 89.6 fl (ref 78.0–100.0)
Platelets: 298 10*3/uL (ref 150.0–400.0)
RBC: 5.34 Mil/uL (ref 4.22–5.81)
RDW: 14.9 % (ref 11.5–15.5)
WBC: 7.1 10*3/uL (ref 4.0–10.5)

## 2023-05-30 LAB — VITAMIN B12: Vitamin B-12: 274 pg/mL (ref 211–911)

## 2023-05-30 LAB — COMPREHENSIVE METABOLIC PANEL WITH GFR
ALT: 36 U/L (ref 0–53)
AST: 40 U/L — ABNORMAL HIGH (ref 0–37)
Albumin: 4.3 g/dL (ref 3.5–5.2)
Alkaline Phosphatase: 40 U/L (ref 39–117)
BUN: 19 mg/dL (ref 6–23)
CO2: 22 meq/L (ref 19–32)
Calcium: 9.8 mg/dL (ref 8.4–10.5)
Chloride: 105 meq/L (ref 96–112)
Creatinine, Ser: 1.59 mg/dL — ABNORMAL HIGH (ref 0.40–1.50)
GFR: 44.34 mL/min — ABNORMAL LOW (ref >60.00)
Glucose, Bld: 99 mg/dL (ref 70–99)
Potassium: 4.5 meq/L (ref 3.5–5.1)
Sodium: 135 meq/L (ref 135–145)
Total Bilirubin: 0.7 mg/dL (ref 0.2–1.2)
Total Protein: 7.1 g/dL (ref 6.0–8.3)

## 2023-05-30 LAB — BRAIN NATRIURETIC PEPTIDE: Pro B Natriuretic peptide (BNP): 45 pg/mL (ref 0.0–100.0)

## 2023-05-30 LAB — HEMOGLOBIN A1C: Hgb A1c MFr Bld: 6.2 % (ref 4.6–6.5)

## 2023-06-04 ENCOUNTER — Other Ambulatory Visit: Payer: Self-pay

## 2023-06-04 ENCOUNTER — Other Ambulatory Visit (HOSPITAL_COMMUNITY): Payer: Self-pay

## 2023-06-06 ENCOUNTER — Encounter: Payer: Self-pay | Admitting: Internal Medicine

## 2023-06-07 ENCOUNTER — Other Ambulatory Visit (HOSPITAL_COMMUNITY): Payer: Self-pay

## 2023-06-13 ENCOUNTER — Other Ambulatory Visit: Payer: Self-pay

## 2023-06-13 ENCOUNTER — Other Ambulatory Visit (HOSPITAL_COMMUNITY): Payer: Self-pay

## 2023-06-14 ENCOUNTER — Other Ambulatory Visit (HOSPITAL_COMMUNITY): Payer: Self-pay

## 2023-06-20 ENCOUNTER — Other Ambulatory Visit (HOSPITAL_COMMUNITY): Payer: Self-pay | Admitting: Cardiology

## 2023-06-20 ENCOUNTER — Other Ambulatory Visit (HOSPITAL_COMMUNITY): Payer: Self-pay

## 2023-06-21 ENCOUNTER — Other Ambulatory Visit (HOSPITAL_COMMUNITY): Payer: Self-pay

## 2023-06-22 ENCOUNTER — Other Ambulatory Visit (HOSPITAL_COMMUNITY): Payer: Self-pay

## 2023-06-22 MED FILL — Dapagliflozin Propanediol Tab 10 MG (Base Equivalent): ORAL | 90 days supply | Qty: 90 | Fill #0 | Status: AC

## 2023-06-28 ENCOUNTER — Ambulatory Visit: Payer: No Typology Code available for payment source | Admitting: Orthopaedic Surgery

## 2023-06-28 ENCOUNTER — Other Ambulatory Visit: Payer: Self-pay

## 2023-06-28 DIAGNOSIS — M25511 Pain in right shoulder: Secondary | ICD-10-CM

## 2023-06-28 DIAGNOSIS — G8929 Other chronic pain: Secondary | ICD-10-CM | POA: Diagnosis not present

## 2023-06-28 MED ORDER — METHYLPREDNISOLONE ACETATE 80 MG/ML IJ SUSP
1.0000 mL | INTRAMUSCULAR | Status: AC | PRN
  Administered 2023-06-28: 1 mL via INTRA_ARTICULAR

## 2023-06-28 MED ORDER — LIDOCAINE HCL 1 % IJ SOLN
4.0000 mL | INTRAMUSCULAR | Status: AC | PRN
  Administered 2023-06-28: 4 mL

## 2023-06-28 NOTE — Progress Notes (Signed)
 Office Visit Note   Patient: Travis Shea           Date of Birth: 1955-03-01           MRN: [id number] Visit Date: 06/28/2023              Requested by: Adelia Homestead, MD [address] [place],  [place] [address] PCP: Adelia Homestead, MD   Assessment & Plan: Visit Diagnoses:  1. Chronic right shoulder pain     Plan: Discussed with patient that patient's shoulder will likely continue to have limited range of motion.  Patient states that he would like to stay active.  Patient would like to go ahead and try a ultrasound-guided injection to the glenohumeral joint at this time and follow-up with shoulder surgeon to discuss future surgery.  Patient tolerated procedure well without any difficulty.  Patient advised follow-up with Dr. Rozelle Corning when ready.  Follow-Up Instructions: No follow-ups on file.   Orders:  Orders Placed This Encounter  Procedures   Large Joint Inj   US  Guided Needle Placement - No Linked Charges   No orders of the defined types were placed in this encounter.     Procedures: Large Joint Inj: R glenohumeral on 06/28/2023 3:33 PM Details: 22 G 3.5 in needle, ultrasound-guided Medications: 4 mL lidocaine  1 %; 1 mL methylPREDNISolone  acetate 80 MG/ML  US -guided glenohumeral joint injection, right shoulder After discussion on risks/benefits/indications, informed verbal consent was obtained. A timeout was then performed. The patient was positioned lying lateral recumbent on examination table. The patient's shoulder was prepped with betadine and multiple alcohol swabs and utilizing ultrasound guidance, the patient's glenohumeral joint was identified on ultrasound. Using ultrasound guidance a 22-gauge, 3.5 inch needle with a mixture of 4:2 cc's lidocaine :depomedrol was directed from a lateral to medial direction via in-plane technique into the glenohumeral joint with visualization of appropriate spread of injectate into the joint. Patient tolerated the  procedure well without immediate complications.      Consent was given by the patient.       Clinical Data: No additional findings.   Subjective: Chief Complaint  Patient presents with   Right Shoulder - Follow-up    Patient is here for follow-up of his right shoulder pain.  Patient notes that he got good relief from previous injection that was done in February of this year.  Patient stating that he would like to do another.  Patient states that he ideally would not like to have surgery but will do so if it becomes a quality-of-life issue.  Patient otherwise has no other concerns at this time and is doing well    Review of Systems   Objective: Vital Signs: There were no vitals taken for this visit.  Physical Exam  Ortho Exam Inspection reveals no gross abnormalities of the right shoulder.  No tenderness palpation of the humeral head.  Decreased range of motion with flexion, abduction and external rotation.   Specialty Comments:  No specialty comments available.  Imaging: No results found.   PMFS History: Patient Active Problem List   Diagnosis Date Noted   BCC (basal cell carcinoma of skin) 04/26/2023   Chronic combined systolic and diastolic heart failure (HCC) 12/20/2022   Syncope 12/18/2022   Elevated troponin 12/18/2022   Coronary artery calcification 12/18/2022   Cardiomegaly 12/18/2022   Renal insufficiency 12/18/2022   AAA (abdominal aortic aneurysm) (HCC) 12/18/2022   Elevated liver function tests 12/18/2022   Dyslipidemia 12/18/2022   Obesity (  BMI 30-39.9) 12/18/2022   Polysubstance use disorder 12/18/2022   Subacute cough 11/29/2022   Essential hypertension 05/26/2022   Other fatigue 05/26/2022   GERD (gastroesophageal reflux disease) 05/26/2022   Past Medical History:  Diagnosis Date   Basal cell carcinoma 02/21/2023   Right breast - needs excision   Basal cell carcinoma 02/21/2023   Left breast - needs excision   BCC (basal cell  carcinoma of skin) 04/26/2023   L Breast - MOHS done 04/26/23    CHF (congestive heart failure) (HCC)    GERD (gastroesophageal reflux disease)    Hypertension    Left knee injury    Melanoma (HCC)     Family History  Problem Relation Age of Onset   Dementia Mother    Lung cancer Father    Other Sister        Lyme's disease   Diabetes Maternal Grandmother    Colon cancer Neg Hx    Colon polyps Neg Hx    Rectal cancer Neg Hx    Stomach cancer Neg Hx     Past Surgical History:  Procedure Laterality Date   COLONOSCOPY     FOOT FRACTURE SURGERY Right    KNEE SURGERY     RIGHT/LEFT HEART CATH AND CORONARY ANGIOGRAPHY N/A 12/20/2022   Procedure: RIGHT/LEFT HEART CATH AND CORONARY ANGIOGRAPHY;  Surgeon: Darlis Eisenmenger, MD;  Location: [facility];  Service: Cardiovascular;  Laterality: N/A;   TOTAL HIP ARTHROPLASTY Right    Social History   Occupational History   Occupation: retiter  Tobacco Use   Smoking status: Never   Smokeless tobacco: Never  Vaping Use   Vaping status: Never Used  Substance and Sexual Activity   Alcohol use: Yes    Comment: Patient reports drinking a 1-2 mixed drinks of whiskey or bourbon a couple days per week   Drug use: Not Currently    Types: "Crack" cocaine, Marijuana   Sexual activity: Not on file

## 2023-06-29 ENCOUNTER — Other Ambulatory Visit (HOSPITAL_COMMUNITY): Payer: Self-pay

## 2023-07-05 ENCOUNTER — Other Ambulatory Visit (HOSPITAL_COMMUNITY): Payer: Self-pay

## 2023-07-10 ENCOUNTER — Telehealth (HOSPITAL_COMMUNITY): Payer: Self-pay

## 2023-07-10 NOTE — Telephone Encounter (Signed)
 Called to confirm/remind patient of their appointment at the [facility] on 07/11/23.   Appointment:   [] Confirmed  [x] Left mess   [] No answer/No voice mail  [] VM Full/unable to leave message  [] Phone not in service  And to bring in all medications and/or complete list.

## 2023-07-10 NOTE — Progress Notes (Signed)
 [facility] NOTE PCP: Adelia Homestead, MD HF Cardiology: Dr. Mitzie Anda Reason for Visit: Heart Failure Follow-up  HPI: Travis Shea is a 68 y.o. with past medical history of HTN, GERD, ETOH/cocaine use, prior anabolic steroid use in 20's and osteoarthritis. Seen 10/24 by PCP for fatigue and new cough and was treated for pneumonia with course of Doxycyline. Pt presented to the ED in 11/24 after syncopal event while sitting on the couch at home. During event he did hit his head on the floor. CT head no acute findings, small vessel ischemic changes. CT PE negative, but notable for moderate coronary artery calcification and 4.3 cm ascending thoracic aortic aneurysm. Lab on admission K 3.4, Cr 1.52, AST/ALT 51/51, Trop 108>97, BNP 75.7. CXR normal. Abd US  showed 3.3 cm abd aortic aneurysm. Echo showed EF 20-25%, RV mildly reduced, mild AS (cannot rule out bicuspid valve), mild AI.  Patient had gentle diuresis in the hospital and was started on GDMT for CHF. Coronary angiography showed 75% mid to distal LAD stenosis that did not explain the extent of his cardiomyopathy.  Cardiac MRI showed EF 23%, mid-wall LGE in the basal septum, mid inferoseptum, mid inferolateral wall and transmural LGE in the apical lateral wall; RV EF 42%, ECV 25%.  BP was controlled. No arrhythmias noted in the hospital, and he was sent home with Lifevest.  He sent the Lifevest back after about 2 wks.   Echo 2/25: EF 40% with mild LV dilation, mild LVH, normal RV, mild AS mean gradient 11 mmHg with trivial AI, IVC normal.   He returns today for heart failure follow up. Overall feeling good. NYHA II. Reports fatigue and bloating. Denies chest pain, dyspnea, near-syncope, orthopnea, palpitations, dizziness, and abnormal bleeding. Able to perform ADLs. Appetite waxes and wanes, has problems with bloating. Has started working out again, however now takes more time between reps to deep breath. Has daytime sleepiness. Weight at  home up 4 lbs, was 112lb last week. BP elevated in clinic today, up to 180 and 160 on post-visit re-check. SBP at home 140s, takes regularly. Compliant with all medications.  PMH: 1. HTN 2. GERD 3. Cocaine abuse 4. Ascending aortic aneurysm: 4.3 cm on 11/24 CTA chest.  5. CAD: Coronary angiography (11/24) with 75% mid-distal LAD stenosis. Medically managed.  6. Chronic systolic CHF: Primarily nonischemic cardiomyopathy, ?related to HTN or cocaine.  - Echo (11/24): EF 20-25%, moderate LV dilation, mild RV dysfunction, mild-moderate AS mean gradient 12 mmHg.  - Cardiac MRI (11/24): LV EF 23%, mild LVH, mid-wall LGE in the basal septum, mid inferoseptum, mid inferolateral wall and transmural LGE in the apical lateral wall; RV EF 42%, ECV 25%.  - Echo (2/25): EF 40% with mild LV dilation, mild LVH, normal RV, mild AS mean gradient 11 mmHg with trivial AI, IVC normal.  7. Aortic stenosis: Mild-moderate on 11/24 echo, possible bicuspid.  - Mild AS on 2/25 echo.  8. Syncope: 11/24 9. CKD stage 3  SH: Retired, married.  H/o ETOH and cocaine abuse.   Family History  Problem Relation Age of Onset   Dementia Mother    Lung cancer Father    Other Sister        Lyme's disease   Diabetes Maternal Grandmother    Colon cancer Neg Hx    Colon polyps Neg Hx    Rectal cancer Neg Hx    Stomach cancer Neg Hx    ROS: All systems reviewed and negative except as per  HPI.   Vitals:   07/11/23 1404  BP: (!) 184/98  Pulse: 76  SpO2: 97%   Filed Weights   07/11/23 1404  Weight: 98.7 kg (217 lb 9.6 oz)   Physical Exam: General: Well appearing. No distress on RA. Walked into clinic Cardiac: JVP ~10cm. S1 and S2 present. No murmurs or rub. Abdomen: Taut, non-tender, distended.  Extremities: Warm and dry.  No peripheral edema.  Neuro: Alert and oriented x3. Affect pleasant. Moves all extremities without difficulty.  Assessment/Plan: 1. Chronic systolic CHF: Primarily nonischemic cardiomyopathy.  Echo in 11/24 with  EF 20-25%, moderate LV dilation, mild-moderate AS with mean gradient 14 mmHg and AVA 1.25 cm^2, mild AI, IVC normal.  He was admitted in 11/24 due to syncopal episode.  Patient has history of untreated HTN as well as cocaine use. No strong FH of cardiomyopathy. It is possible that the cardiomyopathy is due to cocaine/HTN.  However, he had significant LGE on cardiac MRI suggesting possibility of myocarditis or some form of infiltrative disease.  Cardiac MRI in 11/24 showed LV EF 23%, mild LVH, mid-wall LGE in the basal septum, mid inferoseptum, mid inferolateral wall and transmural LGE in the apical lateral wall; RV EF 42%, ECV 25%.  L/RHC in 11/24 showed normal filling pressures, low CI 1.9.  He had a 75% mid-distal LAD stenosis, but do not think that this explains the extent of his cardiomyopathy. Repeat echo in 2/25 showed improved function, EF 40% with mild LV dilation, mild LVH, normal RV, mild AS mean gradient 11 mmHg with trivial AI, IVC normal.    - NYHA class II. Volume up on exam, seems to have central edema. Bloating and tight abdomen - Continue Entresto  97/103 bid.  - Continue Farxiga  10 mg daily.  - Increase Coreg  25 mg bid. - Continue spironolactone  25 mg daily. BMET/BNP today.  - Start Lasix  20 mg PRN, take for the next 3 days, repeat BMET in 10 days - Imperative to quit cocaine => he says that he has quit since 10/24.  - EF now improved and out of ICD range.   2.  H/o Syncope: Occurred on couch while watching TV in 11/24, no prodrome. ?arrhythmic event. No events on telemetry in hospital in 11/24. No further syncope/presyncope. He wore a Lifevest for a couple of weeks after his admission but sent it back. No further syncope or presyncope.   3. CKD stage 3a: BMET today.   4. CAD: Cath in 11/24 showed 75% mid to distal LAD stenosis, otherwise nonobstructive disease. Manage medically for now. - No s/s angina  - Continue ASA 81 + Crestor  20 mg daily. Lipid panel  today  5. Aortic stenosis: Mild to moderate on echo in 11/24, mild AS on 2/25 echo. Hard to rule out bicuspid valve. Will need to follow going forward.    6. Ascending aortic dilation: 4.3 cm on CT in 11/24. Follow with time and control BP.   7. HTN: BP elevated in clinic today. SBP in 140s at home.  - increase coreg  as above   8. Cocaine use: Daily x yrs, has quit.   9. Suspect OSA: Sleep study ordered, has not been completed yet. - daytime sleepiness, need to complete  Follow up in 3 months with APP  [place] Jansen Sciuto, NP 07/10/2023

## 2023-07-11 ENCOUNTER — Ambulatory Visit (HOSPITAL_COMMUNITY): Payer: Self-pay | Admitting: Cardiology

## 2023-07-11 ENCOUNTER — Encounter (HOSPITAL_COMMUNITY): Payer: Self-pay

## 2023-07-11 ENCOUNTER — Ambulatory Visit (HOSPITAL_COMMUNITY)
Admission: RE | Admit: 2023-07-11 | Discharge: 2023-07-11 | Disposition: A | Discharge status: Home / Self Care | Payer: No Typology Code available for payment source | Source: Ambulatory Visit | Attending: Cardiology | Admitting: Cardiology

## 2023-07-11 ENCOUNTER — Other Ambulatory Visit (HOSPITAL_COMMUNITY): Payer: Self-pay

## 2023-07-11 VITALS — BP 166/102 | HR 76 | Wt 217.6 lb

## 2023-07-11 DIAGNOSIS — F1491 Cocaine use, unspecified, in remission: Secondary | ICD-10-CM | POA: Insufficient documentation

## 2023-07-11 DIAGNOSIS — I10 Essential (primary) hypertension: Secondary | ICD-10-CM

## 2023-07-11 DIAGNOSIS — T380X5S Adverse effect of glucocorticoids and synthetic analogues, sequela: Secondary | ICD-10-CM | POA: Diagnosis not present

## 2023-07-11 DIAGNOSIS — G4710 Hypersomnia, unspecified: Secondary | ICD-10-CM | POA: Insufficient documentation

## 2023-07-11 DIAGNOSIS — K219 Gastro-esophageal reflux disease without esophagitis: Secondary | ICD-10-CM | POA: Diagnosis not present

## 2023-07-11 DIAGNOSIS — I77810 Thoracic aortic ectasia: Secondary | ICD-10-CM | POA: Insufficient documentation

## 2023-07-11 DIAGNOSIS — I350 Nonrheumatic aortic (valve) stenosis: Secondary | ICD-10-CM | POA: Diagnosis not present

## 2023-07-11 DIAGNOSIS — N1831 Chronic kidney disease, stage 3a: Secondary | ICD-10-CM

## 2023-07-11 DIAGNOSIS — F1090 Alcohol use, unspecified, uncomplicated: Secondary | ICD-10-CM | POA: Diagnosis not present

## 2023-07-11 DIAGNOSIS — I2510 Atherosclerotic heart disease of native coronary artery without angina pectoris: Secondary | ICD-10-CM

## 2023-07-11 DIAGNOSIS — I428 Other cardiomyopathies: Secondary | ICD-10-CM | POA: Diagnosis not present

## 2023-07-11 DIAGNOSIS — R55 Syncope and collapse: Secondary | ICD-10-CM | POA: Diagnosis present

## 2023-07-11 DIAGNOSIS — I130 Hypertensive heart and chronic kidney disease with heart failure and stage 1 through stage 4 chronic kidney disease, or unspecified chronic kidney disease: Secondary | ICD-10-CM | POA: Insufficient documentation

## 2023-07-11 DIAGNOSIS — I5022 Chronic systolic (congestive) heart failure: Secondary | ICD-10-CM | POA: Diagnosis not present

## 2023-07-11 DIAGNOSIS — I110 Hypertensive heart disease with heart failure: Secondary | ICD-10-CM | POA: Diagnosis present

## 2023-07-11 LAB — BRAIN NATRIURETIC PEPTIDE: B Natriuretic Peptide: 150.4 pg/mL — ABNORMAL HIGH (ref 0.0–100.0)

## 2023-07-11 LAB — LIPID PANEL
Cholesterol: 138 mg/dL (ref 0–200)
HDL: 26 mg/dL — ABNORMAL LOW (ref >40)
LDL Cholesterol: 59 mg/dL (ref 0–99)
Total CHOL/HDL Ratio: 5.3 ratio
Triglycerides: 263 mg/dL — ABNORMAL HIGH (ref <150)
VLDL: 53 mg/dL — ABNORMAL HIGH (ref 0–40)

## 2023-07-11 LAB — BASIC METABOLIC PANEL WITH GFR
Anion gap: 6 (ref 5–15)
BUN: 9 mg/dL (ref 8–23)
CO2: 24 mmol/L (ref 22–32)
Calcium: 9.5 mg/dL (ref 8.9–10.3)
Chloride: 105 mmol/L (ref 98–111)
Creatinine, Ser: 1.18 mg/dL (ref 0.61–1.24)
GFR, Estimated: >60 mL/min (ref >60)
Glucose, Bld: 110 mg/dL — ABNORMAL HIGH (ref 70–99)
Potassium: 3.9 mmol/L (ref 3.5–5.1)
Sodium: 135 mmol/L (ref 135–145)

## 2023-07-11 MED ORDER — CARVEDILOL 25 MG PO TABS
25.0000 mg | ORAL_TABLET | Freq: Two times a day (BID) | ORAL | 1 refills | Status: DC
  Filled 2023-07-11: qty 180, 90d supply, fill #0
  Filled 2023-10-08: qty 180, 90d supply, fill #1

## 2023-07-11 MED ORDER — FUROSEMIDE 20 MG PO TABS
20.0000 mg | ORAL_TABLET | ORAL | 3 refills | Status: AC | PRN
  Filled 2023-07-11: qty 90, 90d supply, fill #0

## 2023-07-11 NOTE — Patient Instructions (Addendum)
 Good to see you today!  INCREASE Coreg  to 25 mg Twice daily  START Lasix  20 mg daily x 3 days then to as needed  Labs done today, your results will be available in MyChart, we will contact you for abnormal readings.  Repeat lab work in 2 weeks   Your physician recommends that you schedule a follow-up appointment: 3 months as scheduled  If you have any questions or concerns before your next appointment please send us  a message through [facility] or call our office at [phone].    TO LEAVE A MESSAGE FOR THE NURSE SELECT OPTION 2, PLEASE LEAVE A MESSAGE INCLUDING: YOUR NAME DATE OF BIRTH CALL BACK NUMBER REASON FOR CALL**this is important as we prioritize the call backs  YOU WILL RECEIVE A CALL BACK THE SAME DAY AS LONG AS YOU CALL BEFORE 4:00 PM At the [facility], you and your health needs are our priority. As part of our continuing mission to provide you with exceptional heart care, we have created designated Provider Care Teams. These Care Teams include your primary Cardiologist (physician) and Advanced Practice Providers (APPs- Physician Assistants and Nurse Practitioners) who all work together to provide you with the care you need, when you need it.   You may see any of the following providers on your designated Care Team at your next follow up: Dr Jules Oar Dr Peder Bourdon Dr. Alwin Baars Dr. Arta Lark Amy Marijane Shoulders, NP Ruddy Corral, [place] [facility] [place], [place] Dennise Fitz, NP [place] Lee, NP Shawnee Dellen, NP Luster Salters, PharmD Bevely Brush, PharmD   Please be sure to bring in all your medications bottles to every appointment.    Thank you for choosing [facility]

## 2023-07-12 ENCOUNTER — Encounter: Payer: Self-pay | Admitting: Dermatology

## 2023-07-25 ENCOUNTER — Ambulatory Visit (HOSPITAL_COMMUNITY)
Admission: RE | Admit: 2023-07-25 | Discharge: 2023-07-25 | Disposition: A | Discharge status: Home / Self Care | Source: Ambulatory Visit | Attending: Cardiology | Admitting: Cardiology

## 2023-07-25 DIAGNOSIS — I5022 Chronic systolic (congestive) heart failure: Secondary | ICD-10-CM | POA: Diagnosis not present

## 2023-07-25 LAB — BASIC METABOLIC PANEL WITH GFR
Anion gap: 7 (ref 5–15)
BUN: 14 mg/dL (ref 8–23)
CO2: 24 mmol/L (ref 22–32)
Calcium: 9.5 mg/dL (ref 8.9–10.3)
Chloride: 105 mmol/L (ref 98–111)
Creatinine, Ser: 1.44 mg/dL — ABNORMAL HIGH (ref 0.61–1.24)
GFR, Estimated: 53 mL/min — ABNORMAL LOW (ref >60)
Glucose, Bld: 98 mg/dL (ref 70–99)
Potassium: 3.8 mmol/L (ref 3.5–5.1)
Sodium: 136 mmol/L (ref 135–145)

## 2023-08-23 ENCOUNTER — Other Ambulatory Visit (HOSPITAL_COMMUNITY): Payer: Self-pay

## 2023-09-05 ENCOUNTER — Ambulatory Visit

## 2023-09-10 ENCOUNTER — Ambulatory Visit: Payer: No Typology Code available for payment source | Admitting: Dermatology

## 2023-09-10 ENCOUNTER — Encounter: Payer: Self-pay | Admitting: Dermatology

## 2023-09-10 ENCOUNTER — Ambulatory Visit (INDEPENDENT_AMBULATORY_CARE_PROVIDER_SITE_OTHER)

## 2023-09-10 VITALS — BP 138/78 | HR 82 | Ht 65.0 in | Wt 209.8 lb

## 2023-09-10 DIAGNOSIS — L814 Other melanin hyperpigmentation: Secondary | ICD-10-CM | POA: Diagnosis not present

## 2023-09-10 DIAGNOSIS — W908XXA Exposure to other nonionizing radiation, initial encounter: Secondary | ICD-10-CM

## 2023-09-10 DIAGNOSIS — Z1283 Encounter for screening for malignant neoplasm of skin: Secondary | ICD-10-CM

## 2023-09-10 DIAGNOSIS — L820 Inflamed seborrheic keratosis: Secondary | ICD-10-CM | POA: Diagnosis not present

## 2023-09-10 DIAGNOSIS — Z0000 Encounter for general adult medical examination without abnormal findings: Secondary | ICD-10-CM | POA: Diagnosis not present

## 2023-09-10 DIAGNOSIS — L578 Other skin changes due to chronic exposure to nonionizing radiation: Secondary | ICD-10-CM

## 2023-09-10 DIAGNOSIS — D1801 Hemangioma of skin and subcutaneous tissue: Secondary | ICD-10-CM | POA: Diagnosis not present

## 2023-09-10 DIAGNOSIS — L821 Other seborrheic keratosis: Secondary | ICD-10-CM

## 2023-09-10 DIAGNOSIS — L570 Actinic keratosis: Secondary | ICD-10-CM

## 2023-09-10 DIAGNOSIS — D229 Melanocytic nevi, unspecified: Secondary | ICD-10-CM

## 2023-09-10 DIAGNOSIS — Z85828 Personal history of other malignant neoplasm of skin: Secondary | ICD-10-CM | POA: Diagnosis not present

## 2023-09-10 NOTE — Patient Instructions (Addendum)

## 2023-09-10 NOTE — Patient Instructions (Addendum)
 Mr. Baccam , Thank you for taking time out of your busy schedule to complete your Annual Wellness Visit with me. I enjoyed our conversation and look forward to speaking with you again next year. I, as well as your care team,  appreciate your ongoing commitment to your health goals. Please review the following plan we discussed and let me know if I can assist you in the future. Your Game plan/ To Do List     Follow up Visits: Next Medicare AWV with our clinical staff: 09/18/2024   Have you seen your provider in the last 6 months (3 months if uncontrolled diabetes)? Yes Next Office Visit with your provider: 12/04/2023  Clinician Recommendations:  Aim for 30 minutes of exercise or brisk walking, 6-8 glasses of water, and 5 servings of fruits and vegetables each day. Educated and advised on getting the Tdap (Tetenus) and Shingles vaccines in 2025.      This is a list of the screening recommended for you and due dates:  Health Maintenance  Topic Date Due   COVID-19 Vaccine (1) Never done   DTaP/Tdap/Td vaccine (1 - Tdap) Never done   Zoster (Shingles) Vaccine (1 of 2) Never done   Flu Shot  09/14/2023   Medicare Annual Wellness Visit  09/09/2024   Colon Cancer Screening  06/25/2025   Pneumococcal Vaccine for age over 54  Completed   Hepatitis B Vaccine  Completed   Hepatitis C Screening  Completed   HPV Vaccine  Aged Out   Meningitis B Vaccine  Aged Out    Advanced directives: (Copy Requested) Please bring a copy of your health care power of attorney and living will to the office to be added to your chart at your convenience. You can mail to [facility] [address] [place], [place] [address] or email to [email or web address] Advance Care Planning is important because it:  [x]  Makes sure you receive the medical care that is consistent with your values, goals, and preferences  [x]  It provides guidance to your family and loved ones and reduces their decisional  burden about whether or not they are making the right decisions based on your wishes.  Follow the link provided in your after visit summary or read over the paperwork we have mailed to you to help you started getting your Advance Directives in place. If you need assistance in completing these, please reach out to us  so that we can help you!

## 2023-09-10 NOTE — Progress Notes (Signed)
 Subjective:   Travis Shea is a 68 y.o. who presents for a Medicare Wellness preventive visit.  As a reminder, Annual Wellness Visits don't include a physical exam, and some assessments may be limited, especially if this visit is performed virtually. We may recommend an in-person follow-up visit with your provider if needed.  Visit Complete: In person  Persons Participating in Visit: Patient.  AWV Questionnaire: No: Patient Medicare AWV questionnaire was not completed prior to this visit.  Cardiac Risk Factors include: advanced age (>42men, >23 women);dyslipidemia;hypertension;male gender;obesity (BMI >30kg/m2)     Objective:    Today's Vitals   09/10/23 1510  BP: 138/78  Pulse: 82  Weight: 209 lb 12.8 oz (95.2 kg)  Height: 5' 5 (1.651 m)   Body mass index is 34.91 kg/m.     09/10/2023    3:10 PM 03/18/2022   11:26 AM 12/16/2021    1:07 PM 08/03/2021   10:39 PM 04/02/2020   11:29 AM 02/08/2020    6:09 PM  Advanced Directives  Does Patient Have a Medical Advance Directive? Yes No No No Yes No  Type of Estate agent of [place];Living will    Healthcare Power of [place];Living will   Copy of Healthcare Power of Attorney in Chart? No - copy requested       Would patient like information on creating a medical advance directive?      No - Patient declined    Current Medications (verified) Outpatient Encounter Medications as of 09/10/2023  Medication Sig   acetaminophen  (TYLENOL ) 325 MG tablet Take 650 mg by mouth daily as needed for mild pain (pain score 1-3) (Shoulder arthritis).   aspirin  81 MG chewable tablet Chew 1 tablet (81 mg total) by mouth daily.   carvedilol  (COREG ) 25 MG tablet Take 1 tablet (25 mg total) by mouth 2 (two) times daily with a meal.   dapagliflozin  propanediol (FARXIGA ) 10 MG TABS tablet Take 1 tablet (10 mg total) by mouth daily.   furosemide  (LASIX ) 20 MG tablet Take 1 tablet (20 mg total) by mouth as needed.   Multiple  Vitamins-Minerals (MULTIVITAMIN WITH MINERALS) tablet Take 1 tablet by mouth daily. Mens Centrum   omeprazole  (PRILOSEC) 40 MG capsule Take 1 capsule (40 mg total) by mouth daily.   ondansetron  (ZOFRAN -ODT) 4 MG disintegrating tablet Dissolve 1 tablet (4 mg total) in mouth every 8 (eight) hours as needed for nausea or vomiting.   rosuvastatin  (CRESTOR ) 20 MG tablet Take 1 tablet (20 mg total) by mouth daily.   sacubitril -valsartan  (ENTRESTO ) 97-103 MG Take 1 tablet by mouth 2 (two) times daily.   spironolactone  (ALDACTONE ) 25 MG tablet Take 1 tablet (25 mg total) by mouth daily.   traZODone  (DESYREL ) 50 MG tablet Take 1 tablet (50 mg total) by mouth at bedtime. (Patient not taking: Reported on 09/10/2023)   No facility-administered encounter medications on file as of 09/10/2023.    Allergies (verified) Morphine and codeine, Other, and Buprenorphine hcl   History: Past Medical History:  Diagnosis Date   Basal cell carcinoma 02/21/2023   Right breast - needs excision   Basal cell carcinoma 02/21/2023   Left breast - needs excision   BCC (basal cell carcinoma of skin) 04/26/2023   L Breast - MOHS done 04/26/23    CHF (congestive heart failure) (HCC)    GERD (gastroesophageal reflux disease)    Hypertension    Left knee injury    Melanoma (HCC)    Past Surgical History:  Procedure  Laterality Date   COLONOSCOPY     FOOT FRACTURE SURGERY Right    KNEE SURGERY     RIGHT/LEFT HEART CATH AND CORONARY ANGIOGRAPHY N/A 12/20/2022   Procedure: RIGHT/LEFT HEART CATH AND CORONARY ANGIOGRAPHY;  Surgeon: Rolan Ezra RAMAN, MD;  Location: [facility] INVASIVE CV [facility];  Service: Cardiovascular;  Laterality: N/A;   TOTAL HIP ARTHROPLASTY Right    Family History  Problem Relation Age of Onset   Dementia Mother    Lung cancer Father    Other Sister        Lyme's disease   Diabetes Maternal Grandmother    Colon cancer Neg Hx    Colon polyps Neg Hx    Rectal cancer Neg Hx    Stomach cancer Neg Hx     Social History   Socioeconomic History   Marital status: Married    Spouse name: Not on file   Number of children: 2   Years of education: Not on file   Highest education level: Bachelor's degree (e.g., BA, AB, BS)  Occupational History   Occupation: retiter  Tobacco Use   Smoking status: Never   Smokeless tobacco: Never  Vaping Use   Vaping status: Never Used  Substance and Sexual Activity   Alcohol use: Yes    Alcohol/week: 1.0 standard drink of alcohol    Types: 1 Shots of liquor per week    Comment: Patient reports drinking a 1-2 mixed drinks of whiskey or bourbon a couple days per week   Drug use: Not Currently    Types: Crack cocaine, Marijuana   Sexual activity: Yes  Other Topics Concern   Not on file  Social History Narrative   Not on file   Social Drivers of Health   Financial Resource Strain: Low Risk  (09/10/2023)   Overall Financial Resource Strain (CARDIA)    Difficulty of Paying Living Expenses: Not hard at all  Food Insecurity: No Food Insecurity (09/10/2023)   Hunger Vital Sign    Worried About Running Out of Food in the Last Year: Never true    Ran Out of Food in the Last Year: Never true  Transportation Needs: No Transportation Needs (09/10/2023)   PRAPARE - Administrator, Civil Service (Medical): No    Lack of Transportation (Non-Medical): No  Physical Activity: Sufficiently Active (09/10/2023)   Exercise Vital Sign    Days of Exercise per Week: 3 days    Minutes of Exercise per Session: 60 min  Stress: No Stress Concern Present (09/10/2023)   [employer] of Occupational Health - Occupational Stress Questionnaire    Feeling of Stress: Not at all  Social Connections: Moderately Integrated (09/10/2023)   Social Connection and Isolation Panel    Frequency of Communication with Friends and Family: More than three times a week    Frequency of Social Gatherings with Friends and Family: Twice a week    Attends Religious Services: More  than 4 times per year    Active Member of [employer] or Organizations: Yes    Attends Engineer, structural: More than 4 times per year    Marital Status: Divorced    Tobacco Counseling Counseling given: No    Clinical Intake:  Pre-visit preparation completed: Yes  Pain : No/denies pain     BMI - recorded: 34.91 Nutritional Status: BMI > 30  Obese Nutritional Risks: None Diabetes: No  Lab Results  Component Value Date   HGBA1C 6.2 05/29/2023   HGBA1C 6.0 11/28/2022  HGBA1C 5.7 05/25/2022     How often do you need to have someone help you when you read instructions, pamphlets, or other written materials from your doctor or pharmacy?: 1 - Never  Interpreter Needed?: No  Information entered by :: Verdie Saba, CMA   Activities of Daily Living     09/10/2023    3:13 PM  In your present state of health, do you have any difficulty performing the following activities:  Hearing? 0  Vision? 0  Difficulty concentrating or making decisions? 0  Walking or climbing stairs? 0  Dressing or bathing? 0  Doing errands, shopping? 0  Preparing Food and eating ? N  Using the Toilet? N  In the past six months, have you accidently leaked urine? N  Do you have problems with loss of bowel control? N  Managing your Medications? N  Managing your Finances? N  Housekeeping or managing your Housekeeping? N    Patient Care Team: Rollene Almarie LABOR, MD as PCP - General (Internal Medicine) Paci, Rufus HERO, MD (Dermatology) Vernetta Lonni GRADE, MD as Consulting Physician (Orthopedic Surgery) Renne Eleanor BRAVO, RN as Registered Nurse (Cardiology)  I have updated your Care Teams any recent Medical Services you may have received from other providers in the past year.     Assessment:   This is a routine wellness examination for Nichael.  Hearing/Vision screen Hearing Screening - Comments:: Denies hearing difficulties   Vision Screening - Comments:: Wears rx glasses - up to  date with routine eye exams with [facility]   Goals Addressed               This Visit's Progress     Patient Stated (pt-stated)        Patient stated he plans to continue exercising       Depression Screen     09/10/2023    3:15 PM 11/28/2022    4:10 PM 11/28/2022    4:04 PM 09/11/2022   10:43 AM 05/25/2022   10:15 AM  PHQ 2/9 Scores  PHQ - 2 Score 0 0 0 0 2  PHQ- 9 Score 6 0   3    Fall Risk     09/10/2023    3:14 PM 05/29/2023    4:06 PM 11/28/2022    4:10 PM 09/11/2022   10:43 AM 05/25/2022   10:05 AM  Fall Risk   Falls in the past year? 1 1  0 0  Number falls in past yr: 1 1 0 0 0  Comment 2 (1 burn on neck; bruise on leg/arm)      Injury with Fall? 1 1 0 0 0  Comment did not go to the Urgent Care      Risk for fall due to :    No Fall Risks   Follow up Falls evaluation completed;Falls prevention discussed Falls evaluation completed Falls evaluation completed Falls evaluation completed Falls evaluation completed    MEDICARE RISK AT HOME:  Medicare Risk at Home Any stairs in or around the home?: Yes (outside) If so, are there any without handrails?: No Home free of loose throw rugs in walkways, pet beds, electrical cords, etc?: Yes Adequate lighting in your home to reduce risk of falls?: Yes Life alert?: No Use of a cane, walker or w/c?: No Grab bars in the bathroom?: No Shower chair or bench in shower?: No Elevated toilet seat or a handicapped toilet?: No  TIMED UP AND GO:  Was the test performed?  No  Cognitive Function: 6CIT completed        09/10/2023    3:18 PM  6CIT Screen  What Year? 0 points  What month? 0 points  What time? 0 points  Count back from 20 0 points  Months in reverse 0 points  Repeat phrase 4 points  Total Score 4 points    Immunizations Immunization History  Administered Date(s) Administered   Fluad Trivalent(High Dose 65+) 01/01/2023   Hepatitis A, Adult 11/28/2022, 05/29/2023   Hepb-cpg 11/28/2022, 12/29/2022    PNEUMOCOCCAL CONJUGATE-20 01/01/2023    Screening Tests Health Maintenance  Topic Date Due   COVID-19 Vaccine (1) Never done   DTaP/Tdap/Td (1 - Tdap) Never done   Zoster Vaccines- Shingrix (1 of 2) Never done   INFLUENZA VACCINE  09/14/2023   Medicare Annual Wellness (AWV)  09/09/2024   Colonoscopy  06/25/2025   Pneumococcal Vaccine: 50+ Years  Completed   Hepatitis B Vaccines  Completed   Hepatitis C Screening  Completed   HPV VACCINES  Aged Out   Meningococcal B Vaccine  Aged Out    Health Maintenance  Health Maintenance Due  Topic Date Due   COVID-19 Vaccine (1) Never done   DTaP/Tdap/Td (1 - Tdap) Never done   Zoster Vaccines- Shingrix (1 of 2) Never done   Health Maintenance Items Addressed:09/10/2023  Additional Screening:  Vision Screening: Recommended annual ophthalmology exams for early detection of glaucoma and other disorders of the eye. Would you like a referral to an eye doctor? No    Dental Screening: Recommended annual dental exams for proper oral hygiene  Community Resource Referral / Chronic Care Management: CRR required this visit?  No   CCM required this visit?  No   Plan:    I have personally reviewed and noted the following in the patient's chart:   Medical and social history Use of alcohol, tobacco or illicit drugs  Current medications and supplements including opioid prescriptions. Patient is not currently taking opioid prescriptions. Functional ability and status Nutritional status Physical activity Advanced directives List of other physicians Hospitalizations, surgeries, and ER visits in previous 12 months Vitals Screenings to include cognitive, depression, and falls Referrals and appointments  In addition, I have reviewed and discussed with patient certain preventive protocols, quality metrics, and best practice recommendations. A written personalized care plan for preventive services as well as general preventive health  recommendations were provided to patient.   Verdie CHRISTELLA Saba, CMA   09/10/2023   After Visit Summary: (In Person-Declined) Patient declined AVS at this time.  Notes: Scheduled a 6-mth f/u (& to discuss insomnia sxs -pt requested to wait until then)

## 2023-09-10 NOTE — Progress Notes (Signed)
   Follow-Up Visit   Subjective  Travis Shea is a 68 y.o. male who presents for the following: Skin Cancer Screening and Full Body Skin Exam  The patient presents for Total-Body Skin Exam (TBSE) for skin cancer screening and mole check. The patient has spots, moles and lesions to be evaluated, some may be new or changing and the patient may have concern these could be cancer.    The following portions of the chart were reviewed this encounter and updated as appropriate: medications, allergies, medical history  Review of Systems:  No other skin or systemic complaints except as noted in HPI or Assessment and Plan.  Objective  Well appearing patient in no apparent distress; mood and affect are within normal limits.  A full examination was performed including scalp, head, eyes, ears, nose, lips, neck, chest, axillae, abdomen, back, buttocks, bilateral upper extremities, bilateral lower extremities, hands, feet, fingers, toes, fingernails, and toenails. All findings within normal limits unless otherwise noted below.   Relevant physical exam findings are noted in the Assessment and Plan.    Assessment & Plan   SKIN CANCER SCREENING PERFORMED TODAY.  ACTINIC DAMAGE - Chronic condition, secondary to cumulative UV/sun exposure - diffuse scaly erythematous macules with underlying dyspigmentation - Recommend daily broad spectrum sunscreen SPF 30+ to sun-exposed areas, reapply every 2 hours as needed.  - Staying in the shade or wearing long sleeves, sun glasses (UVA+UVB protection) and wide brim hats (4-inch brim around the entire circumference of the hat) are also recommended for sun protection.  - Call for new or changing lesions.   MELANOCYTIC NEVI - Tan-brown and/or pink-flesh-colored symmetric macules and papules - Benign appearing on exam today - Observation - Call clinic for new or changing moles - Recommend daily use of broad spectrum spf 30+ sunscreen to sun-exposed areas.    LENTIGINES Exam: scattered tan macules Due to sun exposure Treatment Plan: Benign-appearing, observe. Recommend daily broad spectrum sunscreen SPF 30+ to sun-exposed areas, reapply every 2 hours as needed.  Call for any changes   HEMANGIOMA Exam: red papule(s) Discussed benign nature. Recommend observation. Call for changes.   SEBORRHEIC KERATOSIS - Stuck-on, waxy, tan-brown papules and/or plaques  - Benign-appearing - Discussed benign etiology and prognosis. - Observe - Call for any changes  HISTORY OF BASAL CELL CARCINOMA OF THE SKIN - No evidence of recurrence today - Recommend regular full body skin exams - Recommend daily broad spectrum sunscreen SPF 30+ to sun-exposed areas, reapply every 2 hours as needed.  - Call if any new or changing lesions are noted between office visits     INFLAMED SEBORRHEIC KERATOSIS (2) Left Anterior Neck, right cheek (2) Destruction of lesion - Left Anterior Neck, right cheek (2) AK (ACTINIC KERATOSIS) (2) Right Knee - Anterior (2) Destruction of lesion - Right Knee - Anterior (2)  Destruction method: cryotherapy    Return in about 6 months (around 03/12/2024) for TBSE.  I, Darice Smock, CMA, am acting as scribe for RUFUS CHRISTELLA HOLY, MD.   Documentation: I have reviewed the above documentation for accuracy and completeness, and I agree with the above.  RUFUS CHRISTELLA HOLY, MD

## 2023-09-10 NOTE — Progress Notes (Signed)
 Total Body Skin Exam (TBSE) Visit   Subjective  Travis Shea is a 68 y.o. male who presents for the following: Skin Cancer Screening and Full Body Skin Exam  Patient presents today for follow up visit for TBSE.  The patient has spots, moles and lesions to be evaluated, some may be new or changing.  The following portions of the chart were reviewed this encounter and updated as appropriate: medications, allergies, medical history  Review of Systems:  No other skin or systemic complaints except as noted in HPI or Assessment and Plan.  Objective  Well appearing patient in no apparent distress; mood and affect are within normal limits.  A full examination was performed including scalp, head, eyes, ears, nose, lips, neck, chest, axillae, abdomen, back, buttocks, bilateral upper extremities, bilateral lower extremities, hands, feet, fingers, toes, fingernails, and toenails. All findings within normal limits unless otherwise noted below.   Relevant physical exam findings are noted in the Assessment and Plan.  Left Anterior Neck, right cheek (2) Inflamed stuck on papule Right Knee - Anterior (2) Erythematous thin papules/macules with gritty scale.   Assessment & Plan   LENTIGINES, SEBORRHEIC KERATOSES, HEMANGIOMAS - Benign normal skin lesions - Benign-appearing - Call for any changes  MELANOCYTIC NEVI - Tan-brown and/or pink-flesh-colored symmetric macules and papules - Benign appearing on exam today - Observation - Call clinic for new or changing moles - Recommend daily use of broad spectrum spf 30+ sunscreen to sun-exposed areas.   ACTINIC DAMAGE - Chronic condition, secondary to cumulative UV/sun exposure - diffuse scaly erythematous macules with underlying dyspigmentation - Recommend daily broad spectrum sunscreen SPF 30+ to sun-exposed areas, reapply every 2 hours as needed.  - Staying in the shade or wearing long sleeves, sun glasses (UVA+UVB protection) and wide brim hats  (4-inch brim around the entire circumference of the hat) are also recommended for sun protection.  - Call for new or changing lesions.  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  - Staying in the shade or wearing long sleeves, sun glasses (UVA+UVB protection) and wide brim hats (4-inch brim around the entire circumference of the hat) are also recommended for sun protection.  - Call for new or changing lesions.  Hx of NMSC - Evaluating possible recurrence on right chest, based on patient's HPI - Recommend bi-yearly FBSEs  Inflamed seborrheic keratosis (2) (Primary) Left Anterior Neck, right cheek  Destruction of lesion - Left Anterior Neck, right cheek (2) Complexity: simple   Destruction method: cryotherapy   Informed consent: discussed and consent obtained   Outcome: patient tolerated procedure well with no complications   Post-procedure details: wound care instructions given    AK (actinic keratosis) (2) Right Knee - Anterior  Destruction of lesion - Right Knee - Anterior (2) Complexity: simple   Destruction method: cryotherapy   Outcome: patient tolerated procedure well with no complications   Post-procedure details: wound care instructions given    Lentigines  Cherry angioma  Multiple benign nevi  Seborrheic keratosis  History of nonmelanoma skin cancer   Return in about 6 months (around 03/12/2024) for TBSE.  I, Jetta Ager, am acting as scribe for RUFUS CHRISTELLA HOLY, MD.  Documentation: I have reviewed the above documentation for accuracy and completeness, and I agree with the above.  Conner Neiss  CHRISTELLA HOLY, MD

## 2023-09-13 ENCOUNTER — Other Ambulatory Visit (HOSPITAL_COMMUNITY): Payer: Self-pay

## 2023-09-20 ENCOUNTER — Encounter: Payer: Self-pay | Admitting: Internal Medicine

## 2023-09-20 MED FILL — Dapagliflozin Propanediol Tab 10 MG (Base Equivalent): ORAL | 90 days supply | Qty: 90 | Fill #1 | Status: AC

## 2023-09-21 ENCOUNTER — Encounter (HOSPITAL_COMMUNITY): Payer: Self-pay

## 2023-09-21 ENCOUNTER — Telehealth: Payer: Self-pay

## 2023-09-21 ENCOUNTER — Other Ambulatory Visit (HOSPITAL_COMMUNITY): Payer: Self-pay

## 2023-09-21 MED ORDER — RAMELTEON 8 MG PO TABS
8.0000 mg | ORAL_TABLET | Freq: Every day | ORAL | 0 refills | Status: DC
  Filled 2023-09-21 – 2023-09-26 (×4): qty 30, 30d supply, fill #0

## 2023-09-21 NOTE — Telephone Encounter (Signed)
 Pharmacy Patient Advocate Encounter   Received notification from CoverMyMeds that prior authorization for Ramelteon  8MG  tablets is required/requested.   Insurance verification completed.   The patient is insured through [facility] .   Per test claim: PA required; PA started via CoverMyMeds. KEY [id number] . Waiting for clinical questions to populate.

## 2023-09-21 NOTE — Telephone Encounter (Deleted)
 Pharmacy Patient Advocate Encounter   Received notification from CoverMyMeds that prior authorization for Ramelteon  8MG  tablets is required/requested.   Insurance verification completed.   The patient is insured through [facility] .    (Key: [id number])  Per test claim: PA required; However,  Prior Authorization form/request asks a question that requires your assistance. Please see the question below and advise accordingly. The PA will not be submitted until the necessary information is received. Please see below. IT LOOKS LIKE FROM NOTES PT HAS ONLY TRIED ONE TRIPTAN. PLEASE ASSIST WITH THIS THANK YOU

## 2023-09-24 NOTE — Telephone Encounter (Signed)
 PLEASE BE ADVISED Clinical questions have been answered and PA submitted.TO PLAN. PA currently Pending.

## 2023-09-26 ENCOUNTER — Other Ambulatory Visit (HOSPITAL_COMMUNITY): Payer: Self-pay

## 2023-09-26 NOTE — Telephone Encounter (Signed)
 Pharmacy Patient Advocate Encounter  Received notification from [facility] that Prior Authorization for Ramelteon  8MG  tablets  has been APPROVED from 09/24/2023 to 09/23/2024   [id number]

## 2023-09-26 NOTE — Telephone Encounter (Signed)
 Pharmacy Patient Advocate Encounter  Received notification from [facility] that Prior Authorization for Ramelteon  8MG  tablets has been APPROVED from 06/26/23 to 09/23/24. Ran test claim, Copay is $0. This test claim was processed through [facility]- copay amounts may vary at other pharmacies due to pharmacy/plan contracts, or as the patient moves through the different stages of their insurance plan.   PA #/Case ID/Reference #: [id number]

## 2023-09-27 ENCOUNTER — Ambulatory Visit: Admitting: Orthopaedic Surgery

## 2023-10-02 ENCOUNTER — Other Ambulatory Visit (HOSPITAL_COMMUNITY): Payer: Self-pay

## 2023-10-02 MED ORDER — DICYCLOMINE HCL 10 MG PO CAPS
10.0000 mg | ORAL_CAPSULE | Freq: Three times a day (TID) | ORAL | 0 refills | Status: DC
  Filled 2023-10-02: qty 90, 23d supply, fill #0

## 2023-10-02 NOTE — Addendum Note (Signed)
 Addended by: ROLLENE NORRIS A on: 10/02/2023 02:22 PM   Modules accepted: Orders

## 2023-10-08 ENCOUNTER — Other Ambulatory Visit (HOSPITAL_COMMUNITY): Payer: Self-pay

## 2023-10-09 ENCOUNTER — Other Ambulatory Visit (HOSPITAL_COMMUNITY): Payer: Self-pay

## 2023-10-09 MED ORDER — TETANUS-DIPHTH-ACELL PERTUSSIS 5-2.5-18.5 LF-MCG/0.5 IM SUSY
0.5000 mL | PREFILLED_SYRINGE | Freq: Once | INTRAMUSCULAR | 0 refills | Status: AC
  Filled 2023-10-09: qty 0.5, 1d supply, fill #0

## 2023-10-10 ENCOUNTER — Telehealth (HOSPITAL_COMMUNITY): Payer: Self-pay

## 2023-10-10 NOTE — Telephone Encounter (Signed)
 Called to confirm/remind patient of [facility].   Appointment:   [x] Confirmed  [] Left mess   [] No answer/No voice mail  [] VM Full/unable to leave message  [] Phone not in service  Patient reminded to bring all medications and/or complete list.  Confirmed patient has transportation. Gave directions, instructed to utilize valet parking.

## 2023-10-10 NOTE — Progress Notes (Signed)
 ADVANCED HF CLINIC NOTE PCP: Rollene Almarie LABOR, MD HF Cardiology: Dr. Rolan Reason for Visit: Heart Failure Follow-up  HPI: Travis Shea is a 68 y.o. with past medical history of HTN, GERD, ETOH/cocaine use, prior anabolic steroid use in 20's and osteoarthritis. Seen 10/24 by PCP for fatigue and new cough and was treated for pneumonia with course of Doxycyline. Pt presented to the ED in 11/24 after syncopal event while sitting on the couch at home. During event he did hit his head on the floor. CT head no acute findings, small vessel ischemic changes. CT PE negative, but notable for moderate coronary artery calcification and 4.3 cm ascending thoracic aortic aneurysm. Lab on admission K 3.4, Cr 1.52, AST/ALT 51/51, Trop 108>97, BNP 75.7. CXR normal. Abd US  showed 3.3 cm abd aortic aneurysm. Echo showed EF 20-25%, RV mildly reduced, mild AS (cannot rule out bicuspid valve), mild AI.  Patient had gentle diuresis in the hospital and was started on GDMT for CHF. Coronary angiography showed 75% mid to distal LAD stenosis that did not explain the extent of his cardiomyopathy.  Cardiac MRI showed EF 23%, mid-wall LGE in the basal septum, mid inferoseptum, mid inferolateral wall and transmural LGE in the apical lateral wall; RV EF 42%, ECV 25%.  BP was controlled. No arrhythmias noted in the hospital, and he was sent home with Lifevest.  He sent the Lifevest back after about 2 wks.   Echo 2/25: EF 40% with mild LV dilation, mild LVH, normal RV, mild AS mean gradient 11 mmHg with trivial AI, IVC normal.   Today he returns for AHF follow up. Overall feeling good. Denies palpitations, CP, dizziness, edema, or PND/Orthopnea. No significant SOB, only if he's doing very strenuous activity. Appetite ok, eats multiple small meals during the day and avoids salt. No fever or chills. Weight at home 210 pounds. Taking all medications. Denies tobacco or drug use. Social ETOH. Has been unable to go to the gym over  the last 3 weeks as he had to take care of his granddaughter. SBP ~120s at home.   PMH: 1. HTN 2. GERD 3. Cocaine abuse 4. Ascending aortic aneurysm: 4.3 cm on 11/24 CTA chest.  5. CAD: Coronary angiography (11/24) with 75% mid-distal LAD stenosis. Medically managed.  6. Chronic systolic CHF: Primarily nonischemic cardiomyopathy, ?related to HTN or cocaine.  - Echo (11/24): EF 20-25%, moderate LV dilation, mild RV dysfunction, mild-moderate AS mean gradient 12 mmHg.  - Cardiac MRI (11/24): LV EF 23%, mild LVH, mid-wall LGE in the basal septum, mid inferoseptum, mid inferolateral wall and transmural LGE in the apical lateral wall; RV EF 42%, ECV 25%.  - Echo (2/25): EF 40% with mild LV dilation, mild LVH, normal RV, mild AS mean gradient 11 mmHg with trivial AI, IVC normal.  7. Aortic stenosis: Mild-moderate on 11/24 echo, possible bicuspid.  - Mild AS on 2/25 echo.  8. Syncope: 11/24 9. CKD stage 3  SH: Retired, married.  H/o ETOH and cocaine abuse.   Family History  Problem Relation Age of Onset   Dementia Mother    Lung cancer Father    Other Sister        Lyme's disease   Diabetes Maternal Grandmother    Colon cancer Neg Hx    Colon polyps Neg Hx    Rectal cancer Neg Hx    Stomach cancer Neg Hx    ROS: All systems reviewed and negative except as per HPI.   Vitals:  10/11/23 1523  BP: 118/84  Pulse: 64  SpO2: 96%    Filed Weights   10/11/23 1523  Weight: 96.2 kg (212 lb)   Physical Exam: General:  rywell appearing.  No respirato difficulty. Walked into clinic.  Neck: JVD ~6 cm.  Cor: Regular rate & rhythm. No murmurs. Lungs: clear Extremities: no edema  Neuro: alert & oriented x 3. Affect pleasant.   Assessment/Plan: 1. Chronic systolic CHF: Primarily nonischemic cardiomyopathy. Echo in 11/24 with  EF 20-25%, moderate LV dilation, mild-moderate AS with mean gradient 14 mmHg and AVA 1.25 cm^2, mild AI, IVC normal.  He was admitted in 11/24 due to syncopal  episode.  Patient has history of untreated HTN as well as cocaine use. No strong FH of cardiomyopathy. It is possible that the cardiomyopathy is due to cocaine/HTN.  However, he had significant LGE on cardiac MRI suggesting possibility of myocarditis or some form of infiltrative disease.  Cardiac MRI in 11/24 showed LV EF 23%, mild LVH, mid-wall LGE in the basal septum, mid inferoseptum, mid inferolateral wall and transmural LGE in the apical lateral wall; RV EF 42%, ECV 25%.  L/RHC in 11/24 showed normal filling pressures, low CI 1.9.  He had a 75% mid-distal LAD stenosis, but do not think that this explains the extent of his cardiomyopathy. Repeat echo in 2/25 showed improved function, EF 40% with mild LV dilation, mild LVH, normal RV, mild AS mean gradient 11 mmHg with trivial AI, IVC normal.    - NYHA class II. Euvolemic on exam.   - Continue Farxiga  10 mg daily.  - Continue Coreg  25 mg bid. - Continue spironolactone  25 mg daily. - Continue Lasix  20 mg PRN. Has used very seldomly.  - BMET/BNP today.  - Imperative to quit cocaine => Quit 10/24.  - EF now improved and out of ICD range. Repeat echo ~ February next year.   2.  H/o Syncope: Occurred on couch while watching TV in 11/24, no prodrome. ?arrhythmic event. No events on telemetry in hospital in 11/24. No further syncope/presyncope. He wore a Lifevest for a couple of weeks after his admission but sent it back. No further syncope or presyncope.   3. CKD stage 3a: BMET today.   4. CAD: Cath in 11/24 showed 75% mid to distal LAD stenosis, otherwise nonobstructive disease. Manage medically for now. - No s/s angina  - Continue ASA 81 + Crestor  20 mg daily. LDL 53 5/25  5. Aortic stenosis: Mild to moderate on echo in 11/24, mild AS on 2/25 echo. Hard to rule out bicuspid valve. Will need to follow going forward.    6. Ascending aortic dilation: 4.3 cm on CT in 11/24. Follow with time and control BP.   7. HTN: BP stable today.   - Continue  coreg   8. Cocaine use: Daily x yrs, has quit.   9. Suspect OSA: Sleep study ordered for daytime sleepiness, has not been completed yet.  - Says he called to scheduled but they told him they were pushed too far out? Will reach out to sleep lab for scheduling.   Follow up in 3 months with APP.   Beckey LITTIE Coe, NP 10/11/2023

## 2023-10-11 ENCOUNTER — Ambulatory Visit (HOSPITAL_COMMUNITY)
Admission: RE | Admit: 2023-10-11 | Discharge: 2023-10-11 | Disposition: A | Discharge status: Home / Self Care | Source: Ambulatory Visit | Attending: Internal Medicine | Admitting: Internal Medicine

## 2023-10-11 ENCOUNTER — Encounter (HOSPITAL_COMMUNITY): Payer: Self-pay

## 2023-10-11 VITALS — BP 118/84 | HR 64 | Ht 69.5 in | Wt 212.0 lb

## 2023-10-11 DIAGNOSIS — I10 Essential (primary) hypertension: Secondary | ICD-10-CM | POA: Diagnosis not present

## 2023-10-11 DIAGNOSIS — F1491 Cocaine use, unspecified, in remission: Secondary | ICD-10-CM | POA: Insufficient documentation

## 2023-10-11 DIAGNOSIS — I7121 Aneurysm of the ascending aorta, without rupture: Secondary | ICD-10-CM | POA: Insufficient documentation

## 2023-10-11 DIAGNOSIS — Z87898 Personal history of other specified conditions: Secondary | ICD-10-CM | POA: Diagnosis not present

## 2023-10-11 DIAGNOSIS — I428 Other cardiomyopathies: Secondary | ICD-10-CM | POA: Diagnosis not present

## 2023-10-11 DIAGNOSIS — R29818 Other symptoms and signs involving the nervous system: Secondary | ICD-10-CM | POA: Diagnosis not present

## 2023-10-11 DIAGNOSIS — Z7984 Long term (current) use of oral hypoglycemic drugs: Secondary | ICD-10-CM | POA: Diagnosis not present

## 2023-10-11 DIAGNOSIS — K219 Gastro-esophageal reflux disease without esophagitis: Secondary | ICD-10-CM | POA: Insufficient documentation

## 2023-10-11 DIAGNOSIS — F1090 Alcohol use, unspecified, uncomplicated: Secondary | ICD-10-CM | POA: Diagnosis not present

## 2023-10-11 DIAGNOSIS — M1990 Unspecified osteoarthritis, unspecified site: Secondary | ICD-10-CM | POA: Diagnosis not present

## 2023-10-11 DIAGNOSIS — I2510 Atherosclerotic heart disease of native coronary artery without angina pectoris: Secondary | ICD-10-CM | POA: Diagnosis not present

## 2023-10-11 DIAGNOSIS — Z7982 Long term (current) use of aspirin: Secondary | ICD-10-CM | POA: Diagnosis not present

## 2023-10-11 DIAGNOSIS — N1831 Chronic kidney disease, stage 3a: Secondary | ICD-10-CM | POA: Insufficient documentation

## 2023-10-11 DIAGNOSIS — I5022 Chronic systolic (congestive) heart failure: Secondary | ICD-10-CM | POA: Diagnosis not present

## 2023-10-11 DIAGNOSIS — Z79899 Other long term (current) drug therapy: Secondary | ICD-10-CM | POA: Insufficient documentation

## 2023-10-11 DIAGNOSIS — F1411 Cocaine abuse, in remission: Secondary | ICD-10-CM | POA: Diagnosis not present

## 2023-10-11 DIAGNOSIS — I130 Hypertensive heart and chronic kidney disease with heart failure and stage 1 through stage 4 chronic kidney disease, or unspecified chronic kidney disease: Secondary | ICD-10-CM | POA: Diagnosis not present

## 2023-10-11 DIAGNOSIS — I350 Nonrheumatic aortic (valve) stenosis: Secondary | ICD-10-CM | POA: Insufficient documentation

## 2023-10-11 LAB — BASIC METABOLIC PANEL WITH GFR
Anion gap: 9 (ref 5–15)
BUN: 12 mg/dL (ref 8–23)
CO2: 25 mmol/L (ref 22–32)
Calcium: 10.3 mg/dL (ref 8.9–10.3)
Chloride: 104 mmol/L (ref 98–111)
Creatinine, Ser: 1.43 mg/dL — ABNORMAL HIGH (ref 0.61–1.24)
GFR, Estimated: 53 mL/min — ABNORMAL LOW (ref >60)
Glucose, Bld: 101 mg/dL — ABNORMAL HIGH (ref 70–99)
Potassium: 4.8 mmol/L (ref 3.5–5.1)
Sodium: 138 mmol/L (ref 135–145)

## 2023-10-11 LAB — BRAIN NATRIURETIC PEPTIDE: B Natriuretic Peptide: 41.7 pg/mL (ref 0.0–100.0)

## 2023-10-11 NOTE — Patient Instructions (Addendum)
 Thank you for coming in today  If you had labs drawn today, any labs that are abnormal the clinic will call you No news is good news   Follow up appointments:  Your physician recommends that you schedule a follow-up appointment in:  3 months in clinic   Do the following things EVERYDAY: Weigh yourself in the morning before breakfast. Write it down and keep it in a log. Take your medicines as prescribed Eat low salt foods--Limit salt (sodium) to 2000 mg per day.  Stay as active as you can everyday Limit all fluids for the day to less than 2 liters   At the [facility], you and your health needs are our priority. As part of our continuing mission to provide you with exceptional heart care, we have created designated Provider Care Teams. These Care Teams include your primary Cardiologist (physician) and Advanced Practice Providers (APPs- Physician Assistants and Nurse Practitioners) who all work together to provide you with the care you need, when you need it.   You may see any of the following providers on your designated Care Team at your next follow up: Dr Toribio Fuel Dr Ezra Shuck Dr. Ria Gardenia Greig Lenetta, NP Caffie Shed, [place] [facility] [place], [place] Beckey Coe, NP Tinnie Redman, PharmD   Please be sure to bring in all your medications bottles to every appointment.    Thank you for choosing [facility]  If you have any questions or concerns before your next appointment please send us  a message through [place] or call our office at [phone].    TO LEAVE A MESSAGE FOR THE NURSE SELECT OPTION 2, PLEASE LEAVE A MESSAGE INCLUDING: YOUR NAME DATE OF BIRTH CALL BACK NUMBER REASON FOR CALL**this is important as we prioritize the call backs  YOU WILL RECEIVE A CALL BACK THE SAME DAY AS LONG AS YOU CALL BEFORE 4:00 PM

## 2023-10-12 ENCOUNTER — Ambulatory Visit (HOSPITAL_COMMUNITY): Payer: Self-pay | Admitting: Internal Medicine

## 2023-10-17 ENCOUNTER — Other Ambulatory Visit (HOSPITAL_COMMUNITY): Payer: Self-pay

## 2023-10-18 ENCOUNTER — Other Ambulatory Visit (HOSPITAL_COMMUNITY): Payer: Self-pay

## 2023-10-19 ENCOUNTER — Other Ambulatory Visit: Payer: Self-pay | Admitting: Internal Medicine

## 2023-10-19 ENCOUNTER — Other Ambulatory Visit (HOSPITAL_COMMUNITY): Payer: Self-pay

## 2023-10-19 MED ORDER — DICYCLOMINE HCL 10 MG PO CAPS
10.0000 mg | ORAL_CAPSULE | Freq: Three times a day (TID) | ORAL | 0 refills | Status: DC
  Filled 2023-10-19 – 2023-10-28 (×2): qty 90, 23d supply, fill #0

## 2023-10-25 ENCOUNTER — Other Ambulatory Visit: Payer: Self-pay | Admitting: Internal Medicine

## 2023-10-25 ENCOUNTER — Other Ambulatory Visit (HOSPITAL_COMMUNITY): Payer: Self-pay

## 2023-10-25 ENCOUNTER — Other Ambulatory Visit: Payer: Self-pay

## 2023-10-25 MED ORDER — ONDANSETRON 4 MG PO TBDP
4.0000 mg | ORAL_TABLET | Freq: Three times a day (TID) | ORAL | 1 refills | Status: AC | PRN
  Filled 2023-10-25: qty 20, 7d supply, fill #0
  Filled 2024-03-10: qty 20, 7d supply, fill #1

## 2023-10-25 NOTE — Telephone Encounter (Signed)
 Ok to send in since you have sent in something for stomach issues for patient

## 2023-10-28 ENCOUNTER — Other Ambulatory Visit: Payer: Self-pay | Admitting: Internal Medicine

## 2023-10-29 ENCOUNTER — Other Ambulatory Visit: Payer: Self-pay

## 2023-10-29 ENCOUNTER — Other Ambulatory Visit (HOSPITAL_COMMUNITY): Payer: Self-pay

## 2023-10-29 MED ORDER — ONDANSETRON HCL 4 MG PO TABS
4.0000 mg | ORAL_TABLET | Freq: Three times a day (TID) | ORAL | 0 refills | Status: AC | PRN
  Filled 2023-10-29: qty 20, 7d supply, fill #0

## 2023-10-29 MED ORDER — RAMELTEON 8 MG PO TABS
8.0000 mg | ORAL_TABLET | Freq: Every day | ORAL | 0 refills | Status: DC
  Filled 2023-10-29: qty 30, 30d supply, fill #0

## 2023-10-29 NOTE — Addendum Note (Signed)
 Addended by: ROLLENE NORRIS A on: 10/29/2023 09:22 AM   Modules accepted: Orders

## 2023-10-30 ENCOUNTER — Other Ambulatory Visit: Payer: Self-pay

## 2023-11-01 ENCOUNTER — Emergency Department (HOSPITAL_BASED_OUTPATIENT_CLINIC_OR_DEPARTMENT_OTHER)
Admission: EM | Admit: 2023-11-01 | Discharge: 2023-11-01 | Disposition: A | Discharge status: Home / Self Care | Attending: Emergency Medicine | Admitting: Emergency Medicine

## 2023-11-01 ENCOUNTER — Encounter (HOSPITAL_BASED_OUTPATIENT_CLINIC_OR_DEPARTMENT_OTHER): Payer: Self-pay

## 2023-11-01 ENCOUNTER — Other Ambulatory Visit: Payer: Self-pay

## 2023-11-01 ENCOUNTER — Emergency Department (HOSPITAL_BASED_OUTPATIENT_CLINIC_OR_DEPARTMENT_OTHER)

## 2023-11-01 ENCOUNTER — Other Ambulatory Visit (HOSPITAL_COMMUNITY): Payer: Self-pay

## 2023-11-01 DIAGNOSIS — M546 Pain in thoracic spine: Secondary | ICD-10-CM | POA: Insufficient documentation

## 2023-11-01 DIAGNOSIS — M25511 Pain in right shoulder: Secondary | ICD-10-CM | POA: Diagnosis not present

## 2023-11-01 DIAGNOSIS — Z7982 Long term (current) use of aspirin: Secondary | ICD-10-CM | POA: Diagnosis not present

## 2023-11-01 DIAGNOSIS — M898X1 Other specified disorders of bone, shoulder: Secondary | ICD-10-CM

## 2023-11-01 MED ORDER — LIDOCAINE 5 % EX PTCH
1.0000 | MEDICATED_PATCH | CUTANEOUS | 0 refills | Status: AC
  Filled 2023-11-01: qty 30, 30d supply, fill #0

## 2023-11-01 MED ORDER — LIDOCAINE 5 % EX PTCH
1.0000 | MEDICATED_PATCH | CUTANEOUS | Status: DC
  Administered 2023-11-01: 1 via TRANSDERMAL
  Filled 2023-11-01: qty 1

## 2023-11-01 NOTE — ED Provider Notes (Signed)
 [place] [facility] Provider Note   CSN: [id number] Arrival date & time: 11/01/23  1223     Patient presents with: Back Pain   Travis Shea is a 68 y.o. male presents today for right sided mid back/scapular pain that began last week.  Patient does have a history of right shoulder arthritis.  Patient reports pain is worse with movement.  Patient denies chest pain, shortness of breath, numbness, weakness, injury, fever, chills, or weight loss.    Back Pain      Prior to Admission medications   Medication Sig Start Date End Date Taking? Authorizing Provider  lidocaine  (LIDODERM ) 5 % Place 1 patch onto the skin daily. Remove & Discard patch within 12 hours or as directed by MD 11/01/23  Yes Enora Trillo N, PA-C  acetaminophen  (TYLENOL ) 325 MG tablet Take 650 mg by mouth daily as needed for mild pain (pain score 1-3) (Shoulder arthritis).    [provider]  aspirin  81 MG chewable tablet Chew 1 tablet (81 mg total) by mouth daily. 05/29/23   Rollene Almarie LABOR, MD  carvedilol  (COREG ) 25 MG tablet Take 1 tablet (25 mg total) by mouth 2 (two) times daily with a meal. 07/11/23   Lee, [place], NP  dapagliflozin  propanediol (FARXIGA ) 10 MG TABS tablet Take 1 tablet (10 mg total) by mouth daily. 06/22/23   Rolan Ezra RAMAN, MD  dicyclomine  (BENTYL ) 10 MG capsule Take 1 capsule (10 mg total) by mouth 4 (four) times daily -  before meals and at bedtime. 10/19/23   Rollene Almarie LABOR, MD  furosemide  (LASIX ) 20 MG tablet Take 1 tablet (20 mg total) by mouth as needed. 07/11/23 10/11/23  Lee, [place], NP  Multiple Vitamins-Minerals (MULTIVITAMIN WITH MINERALS) tablet Take 1 tablet by mouth daily. Mens Centrum    [provider]  omeprazole  (PRILOSEC) 40 MG capsule Take 1 capsule (40 mg total) by mouth daily. 05/29/23   Rollene Almarie LABOR, MD  ondansetron  (ZOFRAN ) 4 MG tablet Take 1 tablet (4 mg total) by mouth every 8 (eight) hours as needed for  nausea or vomiting. 10/29/23   Rollene Almarie LABOR, MD  ondansetron  (ZOFRAN -ODT) 4 MG disintegrating tablet Dissolve 1 tablet (4 mg total) in mouth every 8 (eight) hours as needed for nausea or vomiting. 10/25/23   Rollene Almarie LABOR, MD  ramelteon  (ROZEREM ) 8 MG tablet Take 1 tablet (8 mg total) by mouth at bedtime. 10/29/23   Rollene Almarie LABOR, MD  rosuvastatin  (CRESTOR ) 20 MG tablet Take 1 tablet (20 mg total) by mouth daily. 01/18/23 01/18/24  Rolan Ezra RAMAN, MD  sacubitril -valsartan  (ENTRESTO ) 97-103 MG Take 1 tablet by mouth 2 (two) times daily. 01/18/23   Rolan Ezra RAMAN, MD  spironolactone  (ALDACTONE ) 25 MG tablet Take 1 tablet (25 mg total) by mouth daily. 01/18/23   Rolan Ezra RAMAN, MD    Allergies: Morphine and codeine, Other, and Buprenorphine hcl    Review of Systems  Musculoskeletal:  Positive for arthralgias and myalgias.    Updated Vital Signs BP 120/79   Pulse 65   Temp 97.7 F (36.5 C) (Oral)   Resp 17   SpO2 96%   Physical Exam Vitals and nursing note reviewed.  Constitutional:      General: He is not in acute distress.    Appearance: Normal appearance. He is well-developed. He is not ill-appearing.  HENT:     Head: Normocephalic and atraumatic.     Right Ear: External ear normal.  Left Ear: External ear normal.  Eyes:     Conjunctiva/sclera: Conjunctivae normal.  Cardiovascular:     Rate and Rhythm: Normal rate and regular rhythm.     Heart sounds: No murmur heard. Pulmonary:     Effort: Pulmonary effort is normal. No respiratory distress.     Breath sounds: Normal breath sounds.  Abdominal:     Palpations: Abdomen is soft.     Tenderness: There is no abdominal tenderness.  Musculoskeletal:        General: No swelling or deformity.     Right shoulder: Tenderness present.     Left shoulder: Normal.       Arms:     Cervical back: Neck supple.     Comments: Minimal muscular tenderness to palpation of the mid right scapular region.  No  ecchymosis, deformity, swelling, or warmth noted on exam.  Patient with negative Jobe's test bilaterally.  Patient with positive liftoff test bilaterally but states that this is his baseline.  Patient has full range of motion of right and left shoulder otherwise.  Patient is neurovascularly intact.  Skin:    General: Skin is warm and dry.     Capillary Refill: Capillary refill takes less than 2 seconds.  Neurological:     General: No focal deficit present.     Mental Status: He is alert and oriented to person, place, and time.     Sensory: No sensory deficit.     Motor: No weakness.  Psychiatric:        Mood and Affect: Mood normal.     (all labs ordered are listed, but only abnormal results are displayed) Labs Reviewed - No data to display  EKG: None  Radiology: DG Shoulder Right Result Date: 11/01/2023 CLINICAL DATA:  Right shoulder pain EXAM: RIGHT SHOULDER - 2+ VIEW COMPARISON:  12/14/2022 FINDINGS: Frontal, transscapular, and axillary views of the right shoulder are obtained on 3 images. No acute fracture, subluxation, or dislocation. Mild acromioclavicular and glenohumeral joint osteoarthritis again noted. Soft tissues are unremarkable. Visualized portions of the right chest are clear. IMPRESSION: 1. Mild osteoarthritis.  No acute bony abnormality. Electronically Signed   By: Ozell Daring M.D.   On: 11/01/2023 16:22     Procedures   Medications Ordered in the ED  lidocaine  (LIDODERM ) 5 % 1 patch (1 patch Transdermal Patch Applied 11/01/23 1626)                                    Medical Decision Making Amount and/or Complexity of Data Reviewed Radiology: ordered.   This patient presents to the ED for concern of right scapular pain differential diagnosis includes arthritis, fracture, dislocation, muscular pain   Imaging Studies ordered: I ordered imaging studies including right shoulder x-ray I independently visualized and interpreted imaging which showed mild  osteoarthritis.  No acute bony abnormality. I agree with the radiologist interpretation   Medicines ordered and prescription drug management:  I ordered medication including Lidoderm  patch    I have reviewed the patients home medicines and have made adjustments as needed   Problem List / ED Course:  Consider for admission or further workup however patient's vital signs, physical exam, and imaging are reassuring.  Patient symptoms likely due to musculoskeletal pain.  Patient advised to use Lidoderm  patches and Tylenol  for pain.  If pain persist patient should follow-up with his orthopedist for further evaluation and workup.  Patient given return precautions.  I feel patient safer discharge at this time.        Final diagnoses:  Pain in scapula    ED Discharge Orders          Ordered    lidocaine  (LIDODERM ) 5 %  Every 24 hours        11/01/23 1638               Francis Ileana SAILOR, PA-C 11/01/23 1639    Dreama Longs, MD 11/01/23 2324

## 2023-11-01 NOTE — Discharge Instructions (Addendum)
 Today you were seen for scapular pain.  You may pick up the Lidoderm  patches as well as take Tylenol  as needed for pain.  Please follow-up with your orthopedist if your symptoms persist for further evaluation and workup.  Thank you for letting us  treat you today. After reviewing your imaging, I feel you are safe to go home. Please follow up with your PCP in the next several days and provide them with your records from this visit. Return to the Emergency Room if pain becomes severe or symptoms worsen.

## 2023-11-01 NOTE — ED Notes (Signed)
 Pt. Reports he has had R shoulder and mid to low back pain.  Pt. Feels he sleeps on his R shoulder and it may be the cause of his pain problem.  Pt. Is in no distress and has no other pain.  Pt. Able to use arms bilat.  Pt. Able to turn neck R to L and Up and Down.

## 2023-11-01 NOTE — ED Triage Notes (Signed)
 Mid back pain for 4 days. Denies known injury. Ambulatory. Pain worse when twisting. Mild relief from aleve at home  Denies urinary symptoms, chest pain, SHOB

## 2023-11-02 ENCOUNTER — Other Ambulatory Visit (HOSPITAL_COMMUNITY): Payer: Self-pay

## 2023-11-05 ENCOUNTER — Other Ambulatory Visit (HOSPITAL_COMMUNITY): Payer: Self-pay

## 2023-11-19 ENCOUNTER — Telehealth (HOSPITAL_COMMUNITY): Payer: Self-pay

## 2023-11-19 ENCOUNTER — Other Ambulatory Visit (HOSPITAL_COMMUNITY): Payer: Self-pay

## 2023-11-19 NOTE — Telephone Encounter (Signed)
 Advanced Heart Failure Patient Advocate Encounter  The patient was approved for a [employer] grant that will help cover the cost of Carvedilol , Entresto , Farxiga , Spironolactone .  Total amount awarded, $7,500.  Effective: 11/20/2023 - 11/18/2024.  BIN [id number] PCN [id number] [id number] ID [id number]  Approval and processing information added to [facility]. Patient informed via voicemail.  Rachel DEL, CPhT Rx Patient Advocate Phone: [phone]

## 2023-11-28 ENCOUNTER — Other Ambulatory Visit: Payer: Self-pay | Admitting: Internal Medicine

## 2023-11-29 ENCOUNTER — Other Ambulatory Visit (HOSPITAL_COMMUNITY): Payer: Self-pay

## 2023-11-29 ENCOUNTER — Encounter: Payer: Self-pay | Admitting: Pharmacist

## 2023-11-29 MED ORDER — DICYCLOMINE HCL 10 MG PO CAPS
10.0000 mg | ORAL_CAPSULE | Freq: Three times a day (TID) | ORAL | 0 refills | Status: DC
  Filled 2023-11-29: qty 90, 23d supply, fill #0

## 2023-11-29 MED ORDER — RAMELTEON 8 MG PO TABS
8.0000 mg | ORAL_TABLET | Freq: Every day | ORAL | 0 refills | Status: DC
  Filled 2023-11-29: qty 30, 30d supply, fill #0

## 2023-12-03 ENCOUNTER — Encounter (HOSPITAL_BASED_OUTPATIENT_CLINIC_OR_DEPARTMENT_OTHER): Admitting: Cardiology

## 2023-12-04 ENCOUNTER — Ambulatory Visit: Admitting: Internal Medicine

## 2023-12-11 ENCOUNTER — Ambulatory Visit: Admitting: Internal Medicine

## 2023-12-11 ENCOUNTER — Other Ambulatory Visit (HOSPITAL_COMMUNITY): Payer: Self-pay

## 2023-12-11 ENCOUNTER — Encounter: Payer: Self-pay | Admitting: Internal Medicine

## 2023-12-11 VITALS — BP 130/70 | HR 60 | Temp 98.1°F | Ht 69.5 in | Wt 210.6 lb

## 2023-12-11 DIAGNOSIS — E785 Hyperlipidemia, unspecified: Secondary | ICD-10-CM

## 2023-12-11 DIAGNOSIS — Z0000 Encounter for general adult medical examination without abnormal findings: Secondary | ICD-10-CM

## 2023-12-11 DIAGNOSIS — I10 Essential (primary) hypertension: Secondary | ICD-10-CM | POA: Diagnosis not present

## 2023-12-11 MED ORDER — MELOXICAM 7.5 MG PO TABS
7.5000 mg | ORAL_TABLET | Freq: Every day | ORAL | 0 refills | Status: DC
  Filled 2023-12-11: qty 30, 30d supply, fill #0

## 2023-12-11 NOTE — Progress Notes (Unsigned)
 Subjective:   Patient ID: Travis Shea, male    DOB: 1955-09-17, 68 y.o.   MRN: [id number]  The patient is here for physical. Pertinent topics discussed: Discussed the use of AI scribe software for clinical note transcription with the patient, who gave verbal consent to proceed. History of Present Illness Travis Shea is a 68 year old male with arthritis and heart issues who presents with worsening shoulder pain and sleep disturbances.  He has been experiencing significant issues with arthritis, particularly affecting his right shoulder. This week, he 'did something to it' and now cannot lift it higher than a certain point, describing it as feeling like it has a 'big knot on it.' He does not think he pulled or tore it, but is unsure and suspects possible bruising. This has severely impacted his ability to work out, a significant part of his life for 50 years, as he can no longer perform his usual exercises due to joint pain  He has a history of heart issues and mentions taking several medications, including Entresto , spironolactone , and Farxiga . He was previously on 25 mg of carvedilol  but reduced the dose due to feeling groggy and lacking energy. He reports feeling better after adjusting the dose. He also takes a blood thinner and has increased his B12 intake to help with energy levels.  He has been using Ramelteon  for sleep disturbances, which he reports has been effective in helping him sleep better. He takes it only when he feels he needs it, indicating that his body has adapted to it and he has found the right timing for its use.  He has a history of hip replacement and was previously advised to consider a reverse shoulder replacement due to arthritis in his shoulder. He has received cortisone injections in the past, which provided relief.  PMH, [facility], social history reviewed and updated  Review of Systems  Constitutional:  Positive for activity change and fatigue.  HENT:  Negative.    Eyes: Negative.   Respiratory:  Negative for cough, chest tightness and shortness of breath.   Cardiovascular:  Negative for chest pain, palpitations and leg swelling.  Gastrointestinal:  Negative for abdominal distention, abdominal pain, constipation, diarrhea, nausea and vomiting.  Musculoskeletal:  Positive for arthralgias and myalgias.  Skin: Negative.   Neurological: Negative.   Psychiatric/Behavioral:  Positive for sleep disturbance.     Objective:  Physical Exam Constitutional:      Appearance: He is well-developed.  HENT:     Head: Normocephalic and atraumatic.  Cardiovascular:     Rate and Rhythm: Normal rate and regular rhythm.  Pulmonary:     Effort: Pulmonary effort is normal. No respiratory distress.     Breath sounds: Normal breath sounds. No wheezing or rales.  Abdominal:     General: Bowel sounds are normal. There is no distension.     Palpations: Abdomen is soft.     Tenderness: There is no abdominal tenderness.  Musculoskeletal:     Cervical back: Normal range of motion.  Skin:    General: Skin is warm and dry.  Neurological:     Mental Status: He is alert and oriented to person, place, and time.     Coordination: Coordination normal.     Vitals:   12/11/23 1021  BP: 130/70  Pulse: 60  Temp: 98.1 F (36.7 C)  TempSrc: Temporal  SpO2: 98%  Weight: 210 lb 9.6 oz (95.5 kg)  Height: 5' 9.5 (1.765 m)    Assessment &  Plan:

## 2023-12-11 NOTE — Patient Instructions (Signed)
 We will try the meloxicam to help with the joints that you can take every day as needed.

## 2023-12-14 DIAGNOSIS — Z0000 Encounter for general adult medical examination without abnormal findings: Secondary | ICD-10-CM | POA: Insufficient documentation

## 2023-12-14 NOTE — Assessment & Plan Note (Signed)
 Recent labs up to date continue current regimen.

## 2023-12-14 NOTE — Assessment & Plan Note (Signed)
 BP at goal on current regimen will continue. Labs up to date.

## 2023-12-14 NOTE — Assessment & Plan Note (Signed)
 Flu shot declines. Pneumonia complete. Shingrix due at pharmacy. Tetanus up to date. Colonoscopy up to date. Counseled about sun safety and mole surveillance. Counseled about the dangers of distracted driving. Given 10 year screening recommendations.

## 2023-12-17 ENCOUNTER — Encounter: Payer: Self-pay | Admitting: Radiology

## 2024-01-02 ENCOUNTER — Other Ambulatory Visit: Payer: Self-pay | Admitting: Internal Medicine

## 2024-01-02 ENCOUNTER — Other Ambulatory Visit (HOSPITAL_COMMUNITY): Payer: Self-pay | Admitting: Cardiology

## 2024-01-03 ENCOUNTER — Encounter (HOSPITAL_COMMUNITY): Payer: Self-pay

## 2024-01-03 ENCOUNTER — Other Ambulatory Visit: Payer: Self-pay

## 2024-01-03 ENCOUNTER — Other Ambulatory Visit (HOSPITAL_COMMUNITY): Payer: Self-pay

## 2024-01-03 ENCOUNTER — Ambulatory Visit (HOSPITAL_COMMUNITY)
Admission: RE | Admit: 2024-01-03 | Discharge: 2024-01-03 | Disposition: A | Discharge status: Home / Self Care | Source: Ambulatory Visit | Attending: Cardiology | Admitting: Cardiology

## 2024-01-03 ENCOUNTER — Telehealth (HOSPITAL_COMMUNITY): Payer: Self-pay

## 2024-01-03 VITALS — BP 142/80 | HR 76 | Ht 69.5 in | Wt 207.8 lb

## 2024-01-03 DIAGNOSIS — I5042 Chronic combined systolic (congestive) and diastolic (congestive) heart failure: Secondary | ICD-10-CM | POA: Diagnosis not present

## 2024-01-03 DIAGNOSIS — R140 Abdominal distension (gaseous): Secondary | ICD-10-CM | POA: Insufficient documentation

## 2024-01-03 DIAGNOSIS — I428 Other cardiomyopathies: Secondary | ICD-10-CM | POA: Diagnosis present

## 2024-01-03 DIAGNOSIS — Z7982 Long term (current) use of aspirin: Secondary | ICD-10-CM | POA: Insufficient documentation

## 2024-01-03 DIAGNOSIS — F1410 Cocaine abuse, uncomplicated: Secondary | ICD-10-CM | POA: Insufficient documentation

## 2024-01-03 DIAGNOSIS — N183 Chronic kidney disease, stage 3 unspecified: Secondary | ICD-10-CM | POA: Diagnosis not present

## 2024-01-03 DIAGNOSIS — R5383 Other fatigue: Secondary | ICD-10-CM | POA: Diagnosis not present

## 2024-01-03 DIAGNOSIS — I5022 Chronic systolic (congestive) heart failure: Secondary | ICD-10-CM | POA: Insufficient documentation

## 2024-01-03 DIAGNOSIS — I499 Cardiac arrhythmia, unspecified: Secondary | ICD-10-CM | POA: Diagnosis not present

## 2024-01-03 DIAGNOSIS — M1990 Unspecified osteoarthritis, unspecified site: Secondary | ICD-10-CM | POA: Diagnosis not present

## 2024-01-03 DIAGNOSIS — I130 Hypertensive heart and chronic kidney disease with heart failure and stage 1 through stage 4 chronic kidney disease, or unspecified chronic kidney disease: Secondary | ICD-10-CM | POA: Diagnosis present

## 2024-01-03 DIAGNOSIS — I10 Essential (primary) hypertension: Secondary | ICD-10-CM

## 2024-01-03 DIAGNOSIS — Z79899 Other long term (current) drug therapy: Secondary | ICD-10-CM | POA: Insufficient documentation

## 2024-01-03 DIAGNOSIS — Z87898 Personal history of other specified conditions: Secondary | ICD-10-CM

## 2024-01-03 DIAGNOSIS — I350 Nonrheumatic aortic (valve) stenosis: Secondary | ICD-10-CM | POA: Diagnosis not present

## 2024-01-03 DIAGNOSIS — I7121 Aneurysm of the ascending aorta, without rupture: Secondary | ICD-10-CM | POA: Diagnosis not present

## 2024-01-03 DIAGNOSIS — N1831 Chronic kidney disease, stage 3a: Secondary | ICD-10-CM | POA: Insufficient documentation

## 2024-01-03 DIAGNOSIS — R55 Syncope and collapse: Secondary | ICD-10-CM | POA: Insufficient documentation

## 2024-01-03 DIAGNOSIS — I2510 Atherosclerotic heart disease of native coronary artery without angina pectoris: Secondary | ICD-10-CM | POA: Diagnosis not present

## 2024-01-03 DIAGNOSIS — E669 Obesity, unspecified: Secondary | ICD-10-CM

## 2024-01-03 LAB — COMPREHENSIVE METABOLIC PANEL WITH GFR
ALT: 27 U/L (ref 0–44)
AST: 27 U/L (ref 15–41)
Albumin: 3.9 g/dL (ref 3.5–5.0)
Alkaline Phosphatase: 47 U/L (ref 38–126)
Anion gap: 8 (ref 5–15)
BUN: 10 mg/dL (ref 8–23)
CO2: 26 mmol/L (ref 22–32)
Calcium: 10 mg/dL (ref 8.9–10.3)
Chloride: 103 mmol/L (ref 98–111)
Creatinine, Ser: 1.39 mg/dL — ABNORMAL HIGH (ref 0.61–1.24)
GFR, Estimated: 55 mL/min — ABNORMAL LOW (ref >60)
Glucose, Bld: 118 mg/dL — ABNORMAL HIGH (ref 70–99)
Potassium: 4.2 mmol/L (ref 3.5–5.1)
Sodium: 137 mmol/L (ref 135–145)
Total Bilirubin: 1.1 mg/dL (ref 0.0–1.2)
Total Protein: 7.2 g/dL (ref 6.5–8.1)

## 2024-01-03 LAB — LIPID PANEL
Cholesterol: 122 mg/dL (ref 0–200)
HDL: 26 mg/dL — ABNORMAL LOW (ref >40)
LDL Cholesterol: 55 mg/dL (ref 0–99)
Total CHOL/HDL Ratio: 4.7 ratio
Triglycerides: 203 mg/dL — ABNORMAL HIGH (ref <150)
VLDL: 41 mg/dL — ABNORMAL HIGH (ref 0–40)

## 2024-01-03 MED ORDER — MELOXICAM 7.5 MG PO TABS
7.5000 mg | ORAL_TABLET | Freq: Every day | ORAL | 0 refills | Status: DC
  Filled 2024-01-03: qty 30, 30d supply, fill #0

## 2024-01-03 MED ORDER — CARVEDILOL 25 MG PO TABS
25.0000 mg | ORAL_TABLET | Freq: Two times a day (BID) | ORAL | 12 refills | Status: AC
  Filled 2024-01-03: qty 180, 90d supply, fill #0

## 2024-01-03 MED ORDER — RAMELTEON 8 MG PO TABS
8.0000 mg | ORAL_TABLET | Freq: Every day | ORAL | 0 refills | Status: DC
  Filled 2024-01-03: qty 30, 30d supply, fill #0

## 2024-01-03 MED ORDER — DICYCLOMINE HCL 10 MG PO CAPS
10.0000 mg | ORAL_CAPSULE | Freq: Three times a day (TID) | ORAL | 0 refills | Status: DC
  Filled 2024-01-03: qty 90, 23d supply, fill #0

## 2024-01-03 NOTE — Progress Notes (Signed)
 ADVANCED [facility] CLINIC NOTE PCP: Rollene Almarie LABOR, MD HF Cardiology: Dr. Rolan  HPI: Travis Shea is a 68 y.o. with past medical history of HTN, GERD, ETOH/cocaine use, prior anabolic steroid use in 20's and osteoarthritis. Seen 10/24 by PCP for fatigue and new cough and was treated for pneumonia with course of Doxycyline. Pt presented to the ED in 11/24 after syncopal event while sitting on the couch at home. During event he did hit his head on the floor. CT head no acute findings, small vessel ischemic changes. CT PE negative, but notable for moderate coronary artery calcification and 4.3 cm ascending thoracic aortic aneurysm. Lab on admission K 3.4, Cr 1.52, AST/ALT 51/51, Trop 108>97, BNP 75.7. CXR normal. Abd US  showed 3.3 cm abd aortic aneurysm. Echo showed EF 20-25%, RV mildly reduced, mild AS (cannot rule out bicuspid valve), mild AI.  Patient had gentle diuresis in the hospital and was started on GDMT for CHF. Coronary angiography showed 75% mid to distal LAD stenosis that did not explain the extent of his cardiomyopathy.  Cardiac MRI showed EF 23%, mid-wall LGE in the basal septum, mid inferoseptum, mid inferolateral wall and transmural LGE in the apical lateral wall; RV EF 42%, ECV 25%.  BP was controlled. No arrhythmias noted in the hospital, and he was sent home with Lifevest.  He sent the Lifevest back after about 2 wks.   Echo 2/25: EF 40% with mild LV dilation, mild LVH, normal RV, mild AS mean gradient 11 mmHg with trivial AI, IVC normal.   He returns today for heart failure follow up. Overall feeling good, biggest complaint is bloating and occasional fatigue. Has been able to do strenuous exercise in the gym. Trying to lose weight, however has been struggling. He has been helping care for his mother, who has dementia, so has been under stress lately. Denies chest pain, orthopnea, palpitations, and dizziness. Able to perform ADLs. Appetite okay. Weight at home stable. Compliant for  the most part with medications, takes them in the evening, has been taking his bid medications all together so coreg  and entresto  have been doubled in the evening.  PMH: 1. HTN 2. GERD 3. Cocaine abuse 4. Ascending aortic aneurysm: 4.3 cm on 11/24 CTA chest.  5. CAD: Coronary angiography (11/24) with 75% mid-distal LAD stenosis. Medically managed.  6. Chronic systolic CHF: Primarily nonischemic cardiomyopathy, ?related to HTN or cocaine.  - Echo (11/24): EF 20-25%, moderate LV dilation, mild RV dysfunction, mild-moderate AS mean gradient 12 mmHg.  - Cardiac MRI (11/24): LV EF 23%, mild LVH, mid-wall LGE in the basal septum, mid inferoseptum, mid inferolateral wall and transmural LGE in the apical lateral wall; RV EF 42%, ECV 25%.  - Echo (2/25): EF 40% with mild LV dilation, mild LVH, normal RV, mild AS mean gradient 11 mmHg with trivial AI, IVC normal.  7. Aortic stenosis: Mild-moderate on 11/24 echo, possible bicuspid.  - Mild AS on 2/25 echo.  8. Syncope: 11/24 9. CKD stage 3  SH: Retired, married.  H/o ETOH and cocaine abuse.   Family History  Problem Relation Age of Onset   Dementia Mother    Lung cancer Father    Other Sister        Lyme's disease   Diabetes Maternal Grandmother    Colon cancer Neg Hx    Colon polyps Neg Hx    Rectal cancer Neg Hx    Stomach cancer Neg Hx    ROS: All systems reviewed and negative  except as per HPI.   Vitals:   01/03/24 1524  BP: (!) 142/80  Pulse: 76  SpO2: 96%   Filed Weights   01/03/24 1524  Weight: 94.3 kg (207 lb 12.8 oz)   Physical Exam: General: Well appearing. No distress  Cardiac: JVP flat. 2/6 systolic murmur RUSB Abdomen: Soft, distended.  Extremities: Warm and dry.  No edema.  Neuro: A&O x3. Affect pleasant.   Assessment/Plan: 1. Chronic systolic CHF: Primarily nonischemic cardiomyopathy. Echo in 11/24 with  EF 20-25%, moderate LV dilation, mild-moderate AS with mean gradient 14 mmHg and AVA 1.25 cm^2, mild AI, IVC  normal.  He was admitted in 11/24 due to syncopal episode.  Patient has history of untreated HTN as well as cocaine use. No strong FH of cardiomyopathy. It is possible that the cardiomyopathy is due to cocaine/HTN.  However, he had significant LGE on cardiac MRI suggesting possibility of myocarditis or some form of infiltrative disease.  Cardiac MRI in 11/24 showed LV EF 23%, mild LVH, mid-wall LGE in the basal septum, mid inferoseptum, mid inferolateral wall and transmural LGE in the apical lateral wall; RV EF 42%, ECV 25%.  L/RHC in 11/24 showed normal filling pressures, low CI 1.9.  He had a 75% mid-distal LAD stenosis, but do not think that this explains the extent of his cardiomyopathy. Repeat echo in 2/25 showed improved function, EF 40% with mild LV dilation, mild LVH, normal RV, mild AS mean gradient 11 mmHg with trivial AI, IVC normal.    - NYHA class II. Euvolemic on exam.  BMET today. - Continue Farxiga  10 mg daily.  - Continue Coreg  25 mg bid. - Continue spironolactone  25 mg daily. - Continue Lasix  20 mg PRN. Uses occasionally, feels that he should use more than he does. - No cocaine use since 10/24. - Annual surveillance echo in 4 months 2.  H/o Syncope: Occurred on couch while watching TV in 11/24, no prodrome. ?arrhythmic event. No events on telemetry in hospital in 11/24. No further syncope/presyncope. He wore a Lifevest for a couple of weeks after his admission but sent it back. No further syncope or presyncope.  3. CKD stage 3a: CMET today.  4. CAD: Cath in 11/24 showed 75% mid to distal LAD stenosis, otherwise nonobstructive disease. Manage medically for now. Lipid panel/LFTs today. - No s/s angina  - Continue ASA 81 + Crestor  20 mg daily. LDL 53 5/25 5. Aortic stenosis: Mild to moderate on echo in 11/24, mild AS on 2/25 echo. Hard to rule out bicuspid valve. Will need to follow going forward. Does have murmur at RUSB 2/6. No dizziness 6. Ascending aortic dilation: 4.3 cm on CT in  11/24. Follow with time and control BP.  7. HTN: BP stable.   - Continue coreg  8. H/o Cocaine use: Quit 10/24. 9. Suspect OSA: Sleep study ordered for daytime sleepiness, has not been completed yet.  - Needs to reschedule sleep study.  Follow up in 4 month with Dr. Rolan + echo  Livana Yerian, NP 01/03/2024

## 2024-01-03 NOTE — Telephone Encounter (Signed)
 Called to confirm/remind patient of their appointment at the [facility] on 01/03/24.   Appointment:   [x] Confirmed  [] Left mess   [] No answer/No voice mail  [] VM Full/unable to leave message  [] Phone not in service  Patient reminded to bring all medications and/or complete list.  Confirmed patient has transportation. Gave directions, instructed to utilize valet parking.

## 2024-01-03 NOTE — Patient Instructions (Addendum)
 Good to see you today!   Labs done today, your results will be available in MyChart, we will contact you for abnormal readings.  Your physician has requested that you have an echocardiogram. Echocardiography is a painless test that uses sound waves to create images of your heart. It provides your doctor with information about the size and shape of your heart and how well your heart's chambers and valves are working. This procedure takes approximately one hour. There are no restrictions for this procedure. Please do NOT wear cologne, perfume, aftershave, or lotions (deodorant is allowed). Please arrive 15 minutes prior to your appointment time.  Please note: We ask at that you not bring children with you during ultrasound (echo/ vascular) testing. Due to room size and safety concerns, children are not allowed in the ultrasound rooms during exams. Our front office staff cannot provide observation of children in our lobby area while testing is being conducted. An adult accompanying a patient to their appointment will only be allowed in the ultrasound room at the discretion of the ultrasound technician under special circumstances. We apologize for any inconvenience.  You have been referred to heart care pharmacy they will call to schedule an appointment  Your physician recommends that you schedule a follow-up appointment 4 months with echocardiogram(March) Call office in January to schedule an appointment  If you have any questions or concerns before your next appointment please send us  a message through [facility] or call our office at [phone].    TO LEAVE A MESSAGE FOR THE NURSE SELECT OPTION 2, PLEASE LEAVE A MESSAGE INCLUDING: YOUR NAME DATE OF BIRTH CALL BACK NUMBER REASON FOR CALL**this is important as we prioritize the call backs  YOU WILL RECEIVE A CALL BACK THE SAME DAY AS LONG AS YOU CALL BEFORE 4:00 PM At the [facility], you and your health needs are our priority.  As part of our continuing mission to provide you with exceptional heart care, we have created designated Provider Care Teams. These Care Teams include your primary Cardiologist (physician) and Advanced Practice Providers (APPs- Physician Assistants and Nurse Practitioners) who all work together to provide you with the care you need, when you need it.   You may see any of the following providers on your designated Care Team at your next follow up: Dr Toribio Fuel Dr Ezra Shuck Dr. Morene Brownie Greig Mosses, NP Caffie Shed, [place] [facility] [place], [place] Beckey Coe, NP Jordan Lee, NP Ellouise Class, NP Tinnie Redman, PharmD Jaun Bash, PharmD   Please be sure to bring in all your medications bottles to every appointment.    Thank you for choosing [facility]

## 2024-01-04 ENCOUNTER — Ambulatory Visit (HOSPITAL_COMMUNITY): Payer: Self-pay | Admitting: Cardiology

## 2024-01-11 ENCOUNTER — Other Ambulatory Visit (HOSPITAL_COMMUNITY): Payer: Self-pay

## 2024-01-22 ENCOUNTER — Other Ambulatory Visit (HOSPITAL_COMMUNITY): Payer: Self-pay

## 2024-01-25 ENCOUNTER — Telehealth: Payer: Self-pay | Admitting: Pharmacist

## 2024-01-25 NOTE — Telephone Encounter (Signed)
 Pharmacy Quality Measure Review  This patient is appearing on a report for being at risk of failing the adherence measure for diabetes medications this calendar year.   Medication: Farxiga  (prescribed for CHF) Last fill date: 09/21/23 for 90 day supply  Contacted patient - he still has Farxiga  but does need refill. Rx is prescribed by Dr. Rolan. Will request refill from their office.  Darrelyn Drum, PharmD, BCPS, CPP Clinical Pharmacist Practitioner [facility] [phone]

## 2024-01-28 ENCOUNTER — Other Ambulatory Visit (HOSPITAL_COMMUNITY): Payer: Self-pay

## 2024-01-28 MED ORDER — DAPAGLIFLOZIN PROPANEDIOL 10 MG PO TABS
10.0000 mg | ORAL_TABLET | Freq: Every day | ORAL | 3 refills | Status: AC
  Filled 2024-01-28: qty 90, 90d supply, fill #0

## 2024-02-12 ENCOUNTER — Other Ambulatory Visit: Payer: Self-pay | Admitting: Internal Medicine

## 2024-02-13 ENCOUNTER — Other Ambulatory Visit (HOSPITAL_COMMUNITY): Payer: Self-pay

## 2024-02-13 MED ORDER — DICYCLOMINE HCL 10 MG PO CAPS
10.0000 mg | ORAL_CAPSULE | Freq: Three times a day (TID) | ORAL | 0 refills | Status: DC
  Filled 2024-02-13: qty 90, 23d supply, fill #0

## 2024-02-13 MED ORDER — MELOXICAM 7.5 MG PO TABS
7.5000 mg | ORAL_TABLET | Freq: Every day | ORAL | 0 refills | Status: DC
  Filled 2024-02-13: qty 30, 30d supply, fill #0

## 2024-02-13 MED ORDER — RAMELTEON 8 MG PO TABS
8.0000 mg | ORAL_TABLET | Freq: Every day | ORAL | 0 refills | Status: DC
  Filled 2024-02-13: qty 30, 30d supply, fill #0

## 2024-03-10 ENCOUNTER — Other Ambulatory Visit: Payer: Self-pay | Admitting: Internal Medicine

## 2024-03-10 DIAGNOSIS — C4492 Squamous cell carcinoma of skin, unspecified: Secondary | ICD-10-CM

## 2024-03-10 HISTORY — DX: Squamous cell carcinoma of skin, unspecified: C44.92

## 2024-03-11 ENCOUNTER — Other Ambulatory Visit: Payer: Self-pay

## 2024-03-11 ENCOUNTER — Other Ambulatory Visit (HOSPITAL_COMMUNITY): Payer: Self-pay

## 2024-03-11 MED ORDER — DICYCLOMINE HCL 10 MG PO CAPS
10.0000 mg | ORAL_CAPSULE | Freq: Three times a day (TID) | ORAL | 3 refills | Status: AC
  Filled 2024-03-11: qty 90, 23d supply, fill #0

## 2024-03-11 MED ORDER — OMEPRAZOLE 40 MG PO CPDR
40.0000 mg | DELAYED_RELEASE_CAPSULE | Freq: Every day | ORAL | 3 refills | Status: AC
  Filled 2024-03-11: qty 90, 90d supply, fill #0

## 2024-03-11 MED ORDER — RAMELTEON 8 MG PO TABS
8.0000 mg | ORAL_TABLET | Freq: Every day | ORAL | 1 refills | Status: AC
  Filled 2024-03-11 – 2024-03-16 (×3): qty 90, 90d supply, fill #0

## 2024-03-11 MED ORDER — MELOXICAM 7.5 MG PO TABS
7.5000 mg | ORAL_TABLET | Freq: Every day | ORAL | 3 refills | Status: AC
  Filled 2024-03-11: qty 30, 30d supply, fill #0

## 2024-03-12 ENCOUNTER — Encounter: Payer: Self-pay | Admitting: Dermatology

## 2024-03-12 ENCOUNTER — Other Ambulatory Visit (HOSPITAL_COMMUNITY): Payer: Self-pay

## 2024-03-12 ENCOUNTER — Ambulatory Visit: Admitting: Dermatology

## 2024-03-12 ENCOUNTER — Other Ambulatory Visit: Payer: Self-pay | Admitting: Internal Medicine

## 2024-03-12 DIAGNOSIS — D485 Neoplasm of uncertain behavior of skin: Secondary | ICD-10-CM

## 2024-03-12 DIAGNOSIS — D229 Melanocytic nevi, unspecified: Secondary | ICD-10-CM

## 2024-03-12 DIAGNOSIS — L570 Actinic keratosis: Secondary | ICD-10-CM | POA: Diagnosis not present

## 2024-03-12 DIAGNOSIS — L814 Other melanin hyperpigmentation: Secondary | ICD-10-CM | POA: Diagnosis not present

## 2024-03-12 DIAGNOSIS — D1801 Hemangioma of skin and subcutaneous tissue: Secondary | ICD-10-CM | POA: Diagnosis not present

## 2024-03-12 DIAGNOSIS — C44729 Squamous cell carcinoma of skin of left lower limb, including hip: Secondary | ICD-10-CM

## 2024-03-12 DIAGNOSIS — L578 Other skin changes due to chronic exposure to nonionizing radiation: Secondary | ICD-10-CM

## 2024-03-12 DIAGNOSIS — L821 Other seborrheic keratosis: Secondary | ICD-10-CM | POA: Diagnosis not present

## 2024-03-12 DIAGNOSIS — L820 Inflamed seborrheic keratosis: Secondary | ICD-10-CM | POA: Diagnosis not present

## 2024-03-12 DIAGNOSIS — Z1283 Encounter for screening for malignant neoplasm of skin: Secondary | ICD-10-CM

## 2024-03-12 DIAGNOSIS — W908XXA Exposure to other nonionizing radiation, initial encounter: Secondary | ICD-10-CM

## 2024-03-12 DIAGNOSIS — Z85828 Personal history of other malignant neoplasm of skin: Secondary | ICD-10-CM

## 2024-03-12 NOTE — Patient Instructions (Addendum)
 For areas treated with Liquid Nitrogen:  Keep clean with soap and water.  Apply Vaseline or Aquaphor twice daily.    Patient Handout: Wound Care for Skin Biopsy Site  Taking Care of Your Skin Biopsy Site  Proper care of the biopsy site is essential for promoting healing and minimizing scarring. This handout provides instructions on how to care for your biopsy site to ensure optimal recovery.  1. Cleaning the Wound:  Clean the biopsy site daily with gentle soap and water. Gently pat the area dry with a clean, soft towel. Avoid harsh scrubbing or rubbing the area, as this can irritate the skin and delay healing.  2. Applying Aquaphor and Bandage:  After cleaning the wound, apply a thin layer of Aquaphor ointment to the biopsy site. Cover the area with a sterile bandage to protect it from dirt, bacteria, and friction. Change the bandage daily or as needed if it becomes soiled or wet.  3. Continued Care for One Week:  Repeat the cleaning, Aquaphor application, and bandaging process daily for one week following the biopsy procedure. Keeping the wound clean and moist during this initial healing period will help prevent infection and promote optimal healing.  4. Massaging Aquaphor into the Area:  ---After one week, discontinue the use of bandages but continue to apply Aquaphor to the biopsy site. ----Gently massage the Aquaphor into the area using circular motions. ---Massaging the skin helps to promote circulation and prevent the formation of scar tissue.   Additional Tips:  Avoid exposing the biopsy site to direct sunlight during the healing process, as this can cause hyperpigmentation or worsen scarring. If you experience any signs of infection, such as increased redness, swelling, warmth, or drainage from the wound, contact your healthcare provider immediately. Follow any additional instructions provided by your healthcare provider for caring for the biopsy site and managing  any discomfort. Conclusion:  Taking proper care of your skin biopsy site is crucial for ensuring optimal healing and minimizing scarring. By following these instructions for cleaning, applying Aquaphor, and massaging the area, you can promote a smooth and successful recovery. If you have any questions or concerns about caring for your biopsy site, don't hesitate to contact your healthcare provider for guidance.       Important Information   Due to recent changes in healthcare laws, you may see results of your pathology and/or laboratory studies on MyChart before the doctors have had a chance to review them. We understand that in some cases there may be results that are confusing or concerning to you. Please understand that not all results are received at the same time and often the doctors may need to interpret multiple results in order to provide you with the best plan of care or course of treatment. Therefore, we ask that you please give us  2 business days to thoroughly review all your results before contacting the office for clarification. Should we see a critical lab result, you will be contacted sooner.     If You Need Anything After Your Visit   If you have any questions or concerns for your doctor, please call our main line at [phone]. If no one answers, please leave a voicemail as directed and we will return your call as soon as possible. Messages left after 4 pm will be answered the following business day.    You may also send us  a message via MyChart. We typically respond to MyChart messages within 1-2 business days.  For prescription refills,  please ask your pharmacy to contact our office. Our fax number is [phone].  If you have an urgent issue when the clinic is closed that cannot wait until the next business day, you can page your doctor at the number below.     Please note that while we do our best to be available for urgent issues outside of office hours, we are not  available 24/7.    If you have an urgent issue and are unable to reach us , you may choose to seek medical care at your doctor's office, retail clinic, urgent care center, or emergency room.   If you have a medical emergency, please immediately call 911 or go to the emergency department. In the event of inclement weather, please call our main line at [phone] for an update on the status of any delays or closures.  Dermatology Medication Tips: Please keep the boxes that topical medications come in in order to help keep track of the instructions about where and how to use these. Pharmacies typically print the medication instructions only on the boxes and not directly on the medication tubes.   If your medication is too expensive, please contact our office at [phone] or send us  a message through MyChart.    We are unable to tell what your co-pay for medications will be in advance as this is different depending on your insurance coverage. However, we may be able to find a substitute medication at lower cost or fill out paperwork to get insurance to cover a needed medication.    If a prior authorization is required to get your medication covered by your insurance company, please allow us  1-2 business days to complete this process.   Drug prices often vary depending on where the prescription is filled and some pharmacies may offer cheaper prices.   The website www.goodrx.com contains coupons for medications through different pharmacies. The prices here do not account for what the cost may be with help from insurance (it may be cheaper with your insurance), but the website can give you the price if you did not use any insurance.  - You can print the associated coupon and take it with your prescription to the pharmacy.  - You may also stop by our office during regular business hours and pick up a GoodRx coupon card.  - If you need your prescription sent electronically to a different pharmacy,  notify our office through [facility] or by phone at [phone]

## 2024-03-12 NOTE — Progress Notes (Signed)
 "  Follow-Up Visit  History of Present Illness Travis Shea is a 69 year old male who presents for a full body skin check.  He mentions that previous skin concerns have been addressed in past visits. He points out a benign spot on his face and notes that his face occasionally breaks out.  He had shoulder surgery over the summer, during which extensive reconstruction was performed. He describes his body as having a hard time as he is turning [age].  His mother, who has dementia, resides in a memory unit at [facility] in [place]. She developed pneumonia during Christmas, requiring a three-week hospital stay followed by rehabilitation before returning to her residence.  History of NMSCs  The following portions of the chart were reviewed this encounter and updated as appropriate: medications, allergies, medical history  Review of Systems:  No other skin or systemic complaints except as noted in HPI or Assessment and Plan.  Objective  Well appearing patient in no apparent distress; mood and affect are within normal limits.  A full examination was performed including scalp, head, eyes, ears, nose, lips, neck, chest, axillae, abdomen, back, buttocks, bilateral upper extremities, bilateral lower extremities, hands, feet, fingers, toes, fingernails, and toenails. All findings within normal limits unless otherwise noted below.   Relevant physical exam findings are noted in the Assessment and Plan.  Head - Anterior (Face) (3) Erythematous thin papules/macules with gritty scale.  Left Flank Inflamed stuck on papule Left Lower Leg - Posterior 1.3 cm Pink Scaly Plaque   Assessment & Plan   SKIN CANCER SCREENING PERFORMED TODAY.  ACTINIC DAMAGE - Chronic condition, secondary to cumulative UV/sun exposure - diffuse scaly erythematous macules with underlying dyspigmentation - Recommend daily broad spectrum sunscreen SPF 30+ to sun-exposed areas, reapply every 2 hours as  needed.  - Staying in the shade or wearing long sleeves, sun glasses (UVA+UVB protection) and wide brim hats (4-inch brim around the entire circumference of the hat) are also recommended for sun protection.  - Call for new or changing lesions.  MELANOCYTIC NEVI - Tan-brown and/or pink-flesh-colored symmetric macules and papules - Benign appearing on exam today - Observation - Call clinic for new or changing moles - Recommend daily use of broad spectrum spf 30+ sunscreen to sun-exposed areas.   LENTIGINES Exam: scattered tan macules Due to sun exposure Treatment Plan: Benign-appearing, observe. Recommend daily broad spectrum sunscreen SPF 30+ to sun-exposed areas, reapply every 2 hours as needed.  Call for any changes   HEMANGIOMA Exam: red papule(s) Discussed benign nature. Recommend observation. Call for changes.   SEBORRHEIC KERATOSIS - Stuck-on, waxy, tan-brown papules and/or plaques  - Benign-appearing - Discussed benign etiology and prognosis. - Observe - Call for any changes  HISTORY OF BASAL CELL CARCINOMA OF THE SKIN - No evidence of recurrence today - Recommend regular full body skin exams - Recommend daily broad spectrum sunscreen SPF 30+ to sun-exposed areas, reapply every 2 hours as needed.  - Call if any new or changing lesions are noted between office visits   AK (ACTINIC KERATOSIS) (3) Head - Anterior (Face) (3) - Destruction of lesion - Head - Anterior (Face) (3) Complexity: simple   Destruction method: cryotherapy   Informed consent: discussed and consent obtained   Timeout:  patient name, date of birth, surgical site, and procedure verified Lesion destroyed using liquid nitrogen: Yes   Region frozen until ice ball extended beyond lesion: Yes   Outcome: patient tolerated procedure well with no complications  Post-procedure details: wound care instructions given    INFLAMED SEBORRHEIC KERATOSIS Left Flank - Destruction of lesion - Left  Flank Complexity: simple   Destruction method: cryotherapy   Timeout:  patient name, date of birth, surgical site, and procedure verified Lesion destroyed using liquid nitrogen: Yes   Region frozen until ice ball extended beyond lesion: Yes   Outcome: patient tolerated procedure well with no complications   Post-procedure details: wound care instructions given    NEOPLASM OF UNCERTAIN BEHAVIOR OF SKIN Left Lower Leg - Posterior - Skin / nail biopsy Type of biopsy: tangential   Informed consent: discussed and consent obtained   Timeout: patient name, date of birth, surgical site, and procedure verified   Procedure prep:  Patient was prepped and draped in usual sterile fashion Prep type:  Isopropyl alcohol Anesthesia: the lesion was anesthetized in a standard fashion   Anesthetic:  1% lidocaine  w/ epinephrine 1-100,000 buffered w/ 8.4% NaHCO3 Instrument used: DermaBlade   Hemostasis achieved with: aluminum chloride   Outcome: patient tolerated procedure well   Post-procedure details: sterile dressing applied and wound care instructions given   Dressing type: bandage    Specimen 1 - Surgical pathology Differential Diagnosis: NMSC vs other  Check Margins: No CHERRY ANGIOMA   MULTIPLE BENIGN NEVI   SEBORRHEIC KERATOSIS   HISTORY OF NONMELANOMA SKIN CANCER   LENTIGINES   Return in about 6 months (around 09/09/2024) for TBSE.  LILLETTE Virgle Boards, Dermatology [employer] am acting as a scribe for RUFUS CHRISTELLA HOLY, MD.  Documentation: I have reviewed the above documentation for accuracy and completeness, and I agree with the above.  RUFUS CHRISTELLA HOLY, MD    "

## 2024-03-13 ENCOUNTER — Other Ambulatory Visit (HOSPITAL_COMMUNITY): Payer: Self-pay

## 2024-03-13 ENCOUNTER — Encounter (HOSPITAL_COMMUNITY): Payer: Self-pay

## 2024-03-13 LAB — SURGICAL PATHOLOGY

## 2024-03-14 ENCOUNTER — Ambulatory Visit: Payer: Self-pay | Admitting: Dermatology

## 2024-03-17 ENCOUNTER — Other Ambulatory Visit (HOSPITAL_COMMUNITY): Payer: Self-pay

## 2024-03-17 NOTE — Telephone Encounter (Signed)
Lm for pt to call for results

## 2024-03-18 ENCOUNTER — Encounter: Payer: Self-pay | Admitting: Dermatology

## 2024-03-19 ENCOUNTER — Other Ambulatory Visit (HOSPITAL_COMMUNITY): Payer: Self-pay

## 2024-03-19 ENCOUNTER — Ambulatory Visit: Admitting: Dermatology

## 2024-03-19 ENCOUNTER — Other Ambulatory Visit (HOSPITAL_COMMUNITY): Payer: Self-pay | Admitting: Cardiology

## 2024-03-19 ENCOUNTER — Encounter: Payer: Self-pay | Admitting: Dermatology

## 2024-03-19 VITALS — BP 167/86 | HR 60 | Temp 97.8°F

## 2024-03-19 DIAGNOSIS — I5042 Chronic combined systolic (congestive) and diastolic (congestive) heart failure: Secondary | ICD-10-CM

## 2024-03-19 DIAGNOSIS — L578 Other skin changes due to chronic exposure to nonionizing radiation: Secondary | ICD-10-CM

## 2024-03-19 DIAGNOSIS — L814 Other melanin hyperpigmentation: Secondary | ICD-10-CM

## 2024-03-19 DIAGNOSIS — C44729 Squamous cell carcinoma of skin of left lower limb, including hip: Secondary | ICD-10-CM | POA: Diagnosis not present

## 2024-03-19 DIAGNOSIS — C4492 Squamous cell carcinoma of skin, unspecified: Secondary | ICD-10-CM

## 2024-03-19 MED ORDER — MUPIROCIN 2 % EX OINT
1.0000 | TOPICAL_OINTMENT | Freq: Two times a day (BID) | CUTANEOUS | 1 refills | Status: AC
  Filled 2024-03-19: qty 22, 30d supply, fill #0

## 2024-03-19 NOTE — Progress Notes (Signed)
 "  Follow-Up Visit   Subjective  Travis Shea is a 69 y.o. 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 Corey.  The following portions of the chart were reviewed this encounter and updated as appropriate: medications, allergies, medical history  Review of Systems:  No other skin or systemic complaints except as noted in HPI or Assessment and Plan.  Objective  Well appearing patient in no apparent distress; mood and affect are within normal limits.  A focused examination was performed of the following areas: Left lower leg posterior Relevant physical exam findings are noted in the Assessment and Plan.   Left Lower Leg - Posterior Healing biopsy wound   Assessment & Plan   SQUAMOUS CELL CARCINOMA OF SKIN Left Lower Leg - Posterior - Mohs surgery  Consent obtained: written  Anticoagulation: Is the patient taking prescription anticoagulant and/or aspirin  prescribed/recommended by a physician? No   Was the anticoagulation regimen changed prior to Mohs? No    Anesthesia: Anesthesia method: local infiltration Local anesthetic: lidocaine  1% WITH epi  Procedure Details: Timeout: pre-procedure verification complete Procedure Prep: patient was prepped and draped in usual sterile fashion Prep type: chlorhexidine Biopsy accession number: [id number] Pre-Op diagnosis: squamous cell carcinoma SCC subtype: KA type MohsAIQ Surgical site (if tumor spans multiple areas, please select predominant area): lower limb (including hip) Surgery side: left Surgical site (from skin exam): Left Lower Leg - Posterior Pre-operative length (cm): 1.5 Pre-operative width (cm): 1.6 Indications for Mohs surgery: anatomic location where tissue conservation is critical  Micrographic Surgery Details: Post-operative length (cm): 2.6 Post-operative width (cm): 2.5 Number of Mohs stages: 1 Post surgery depth of  defect: subcutaneous fat  Stage 1    Tumor features identified on Mohs section: no tumor identified  Reconstruction: Was the defect reconstructed?: No      Return in about 4 weeks (around 04/16/2024) for wound check.   03/19/2024  HISTORY OF PRESENT ILLNESS  Travis Shea is seen in consultation at the request of Dr. 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 They note that the area has been present for about 4 months increasing in size with time.  There is no history of previous treatment.  Reports no other new or changing lesions and has no other complaints today.  Medications and allergies: see patient chart.  Review of systems: Reviewed 8 systems and notable for the above skin cancer.  All other systems reviewed are unremarkable/negative, unless noted in the HPI. Past medical history, surgical history, family history, social history were also reviewed and are noted in the chart/questionnaire.    PHYSICAL EXAMINATION  General: Well-appearing, in no acute distress, alert and oriented x 4. Vitals reviewed in chart (if available).   Skin: Exam reveals a 1.5 x 1.6 cm erythematous papule and biopsy scar on the left lower leg. There are rhytids, telangiectasias, and lentigines, consistent with photodamage.  Biopsy report(s) reviewed, confirming the diagnosis.   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 Due to location, size, histology, or recurrence and the likelihood of subclinical extension as well as the need to conserve normal surrounding tissue, the patient was deemed acceptable for Mohs micrographic surgery (MMS).  The nature and purpose of the procedure, associated benefits and risks including recurrence and scarring, possible complications such as pain, infection,  and bleeding, and alternative methods of treatment if appropriate were discussed with the patient  during consent. The lesion location was verified by the patient, by reviewing previous notes, pathology reports, and by photographs as well as angulation measurements if available.  Informed consent was reviewed and signed by the patient, and timeout was performed at 10:00 AM. See op note below.  2. For the photodamage and solar lentigines, sun protection discussed/information given on OTC sunscreens, and we recommend continued regular follow-up with primary dermatologist every 6 months or sooner for any growing, bleeding, or changing lesions. 3. Prognosis and future surveillance discussed. 4. Letter with treatment outcome sent to referring provider. 5. Pain acetaminophen /ibuprofen   MOHS MICROGRAPHIC SURGERY AND RECONSTRUCTION  Initial size:   1.5 x 1.6 cm Surgical defect/wound size: 2.6 x 2.5 cm Anesthesia:    0.33% lidocaine  with 1:200,000 epinephrine EBL:    <5 mL Complications:  None Repair type:   Second Intention   Stages: 1  STAGE I: Anesthesia achieved with 0.5% lidocaine  with 1:200,000 epinephrine. ChloraPrep applied. 1 section(s) excised using Mohs technique (this includes total peripheral and deep tissue margin excision and evaluation with frozen sections, excised and interpreted by the same physician). The tumor was first debulked and then excised with an approx. 2mm margin.  Hemostasis was achieved with electrocautery as needed.  The specimen was then oriented, subdivided/relaxed, inked, and processed using Mohs technique.    Frozen section analysis revealed a clear deep and peripheral margin.   Reconstruction  Patient was notified of results and repair options were discussed, including second intention healing. After reviewing the advantages and disadvantages of each, we agreed on second intention healing as appropriate.   The surgical site was then lightly scrubbed with sterile, saline-soaked gauze.  The area was bandaged using Vaseline ointment, non-adherent gauze, gauze  pads, and tape to provide an adequate pressure dressing.   The patient tolerated the procedure well, was given detailed written and verbal wound care instructions, and was discharged in good condition.  The patient will follow-up in 4 weeks and as scheduled with primary dermatologist.    Documentation: I have reviewed the above documentation for accuracy and completeness, and I agree with the above.  I, Doyce Pan, CMA, am acting as scribe for RUFUS CHRISTELLA HOLY, MD.  RUFUS CHRISTELLA HOLY, MD  "

## 2024-03-19 NOTE — Patient Instructions (Signed)

## 2024-03-21 ENCOUNTER — Encounter: Payer: Self-pay | Admitting: Internal Medicine

## 2024-03-21 ENCOUNTER — Ambulatory Visit (HOSPITAL_BASED_OUTPATIENT_CLINIC_OR_DEPARTMENT_OTHER): Admitting: Orthopaedic Surgery

## 2024-04-04 ENCOUNTER — Ambulatory Visit: Admitting: Pharmacist

## 2024-04-17 ENCOUNTER — Ambulatory Visit (HOSPITAL_BASED_OUTPATIENT_CLINIC_OR_DEPARTMENT_OTHER): Admitting: Orthopaedic Surgery

## 2024-04-21 ENCOUNTER — Ambulatory Visit: Admitting: Dermatology

## 2024-05-23 ENCOUNTER — Other Ambulatory Visit (HOSPITAL_COMMUNITY)

## 2024-05-23 ENCOUNTER — Ambulatory Visit (HOSPITAL_COMMUNITY): Admitting: Cardiology

## 2024-09-18 ENCOUNTER — Ambulatory Visit

## 2024-09-22 ENCOUNTER — Ambulatory Visit: Admitting: Dermatology
